# Patient Record
Sex: Male | Born: 1950
Health system: Southern US, Community
[De-identification: ages and names within clinical notes are randomized; demographics above are authoritative.]

## PROBLEM LIST (undated history)

## (undated) DIAGNOSIS — Z860100 Personal history of colon polyps, unspecified: Secondary | ICD-10-CM

## (undated) DIAGNOSIS — C801 Malignant (primary) neoplasm, unspecified: Secondary | ICD-10-CM

## (undated) DIAGNOSIS — E785 Hyperlipidemia, unspecified: Secondary | ICD-10-CM

## (undated) DIAGNOSIS — G4730 Sleep apnea, unspecified: Secondary | ICD-10-CM

## (undated) DIAGNOSIS — M1990 Unspecified osteoarthritis, unspecified site: Secondary | ICD-10-CM

## (undated) DIAGNOSIS — C439 Malignant melanoma of skin, unspecified: Secondary | ICD-10-CM

## (undated) HISTORY — PX: MEDIAL PARTIAL KNEE REPLACEMENT: SHX5965

## (undated) HISTORY — PX: COLONOSCOPY: SHX174

## (undated) HISTORY — DX: Sleep apnea, unspecified: G47.30

## (undated) HISTORY — DX: Personal history of colonic polyps: Z86.010

## (undated) HISTORY — DX: Personal history of colon polyps, unspecified: Z86.0100

## (undated) HISTORY — PX: KNEE ARTHROSCOPY: SHX127

## (undated) HISTORY — DX: Hyperlipidemia, unspecified: E78.5

## (undated) HISTORY — DX: Malignant melanoma of skin, unspecified: C43.9

---

## 2005-04-10 ENCOUNTER — Ambulatory Visit: Payer: Self-pay | Admitting: Family Medicine

## 2006-08-14 ENCOUNTER — Emergency Department: Payer: Self-pay | Admitting: Emergency Medicine

## 2007-04-21 ENCOUNTER — Ambulatory Visit: Payer: Self-pay | Admitting: Family Medicine

## 2007-04-21 DIAGNOSIS — IMO0002 Reserved for concepts with insufficient information to code with codable children: Secondary | ICD-10-CM | POA: Insufficient documentation

## 2007-10-28 ENCOUNTER — Telehealth: Payer: Self-pay | Admitting: Family Medicine

## 2009-10-10 ENCOUNTER — Ambulatory Visit: Payer: Self-pay | Admitting: Family Medicine

## 2009-10-10 DIAGNOSIS — J157 Pneumonia due to Mycoplasma pneumoniae: Secondary | ICD-10-CM | POA: Insufficient documentation

## 2010-08-23 ENCOUNTER — Ambulatory Visit: Admit: 2010-08-23 | Payer: Self-pay | Admitting: Family Medicine

## 2010-09-10 NOTE — Assessment & Plan Note (Signed)
Summary: cold like symptoms/alc   Vital Signs:  Patient profile:   60 year old male Height:      68 inches Weight:      202.0 pounds BMI:     30.83 Temp:     98.2 degrees F oral Pulse rate:   72 / minute Pulse rhythm:   regular BP sitting:   110 / 70  (left arm) Cuff size:   regular  Vitals Entered By: Benny Lennert CMA Duncan Dull) (October 10, 2009 12:03 PM) CC: cold symptoms for 1 weeks Is Patient Diabetic? No   History of Present Illness: 60 year old:    Acute Visit History:      The patient complains of cough, headache, nasal discharge, sinus problems, and sore throat.  He denies abdominal pain, chest pain, diarrhea, earache, fever, genitourinary symptoms, musculoskeletal symptoms, nausea, and rash.  Other comments include: 10 day history, + chills.        The character of the cough is described as productive.  He has no history of COPD.  There is no history of wheezing, sleep interference, shortness of breath, respiratory retractions, tachypnea, cyanosis, or interference with oral intake associated with his cough.        'Cold' or URI symptoms have been present with the sore throat.  There is no history of dysphagia, drooling, or recent exposure to strep.        He complains of sinus pressure and frontal headache.        Urine output has been normal.  He is tolerating clear liquids.        Allergies (verified): No Known Drug Allergies  Past History:  Past medical, surgical, family and social histories (including risk factors) reviewed for relevance to current acute and chronic problems.  Past Medical History: generally healthy, takes no meds  Family History: Reviewed history and no changes required. n/c  Social History: Reviewed history and no changes required. Married  Review of Systems       REVIEW OF SYSTEMS GEN: Acute illness details above. CV: No chest pain or SOB GI: No noted N or V Otherwise, pertinent positives and negatives are noted in the HPI.    Physical Exam  Additional Exam:  GEN: A and O x 3. WDWN. NAD.    ENT: Nose clear, ext NML.  No LAD.  No JVD.  TM's clear. Oropharynx clear.  PULM: Normal WOB, no distress. no focal crackles, but there are some diffuse rhonchorous sounds. No wheezing. CV: RRR, no M/G/R, No rubs, No JVD.   ABD: S, NT, ND, + BS. No rebound. No guarding. No HSM.   EXT: warm and well-perfused, No c/c/e. PSYCH: Pleasant and conversant.    Impression & Recommendations:  Problem # 1:  WALKING PNEUMONIA (ICD-483.0) Assessment New Discussed treatment based on lung exam - 10 day history would treat with antibiotics.  c/w otc sinus and cold medications.  Not acutely ill on exam, but not c/w pure uri   The following medications were removed from the medication list:    Keflex 500 Mg Caps (Cephalexin) .Marland Kitchen... 1 by mouth two times a day for 10 days His updated medication list for this problem includes:    Azithromycin 250 Mg Tabs (Azithromycin) .Marland Kitchen... 2 by  mouth today and then 1 daily for 4 days  Instructed patient to complete antibiotics, and call for worsened shortness of breath or new symptoms.   Complete Medication List: 1)  Azithromycin 250 Mg Tabs (Azithromycin) .... 2  by  mouth today and then 1 daily for 4 days Prescriptions: AZITHROMYCIN 250 MG  TABS (AZITHROMYCIN) 2 by  mouth today and then 1 daily for 4 days  #6 x 0   Entered and Authorized by:   Hannah Beat MD   Signed by:   Hannah Beat MD on 10/10/2009   Method used:   Electronically to        [facility]* (retail)       [address]       [place], [place]  [address]       Ph: [phone]       Fax: [phone]   RxID:   [id number]   Current Allergies (reviewed today): No known allergies

## 2011-07-14 ENCOUNTER — Ambulatory Visit (INDEPENDENT_AMBULATORY_CARE_PROVIDER_SITE_OTHER)
Admission: RE | Admit: 2011-07-14 | Discharge: 2011-07-14 | Disposition: A | Discharge status: Home / Self Care | Payer: PRIVATE HEALTH INSURANCE | Source: Ambulatory Visit | Attending: Family Medicine | Admitting: Family Medicine

## 2011-07-14 ENCOUNTER — Encounter: Payer: Self-pay | Admitting: Family Medicine

## 2011-07-14 ENCOUNTER — Ambulatory Visit (INDEPENDENT_AMBULATORY_CARE_PROVIDER_SITE_OTHER): Payer: PRIVATE HEALTH INSURANCE | Admitting: Family Medicine

## 2011-07-14 VITALS — BP 116/78 | HR 68 | Temp 97.9°F | Ht 69.0 in | Wt 207.2 lb

## 2011-07-14 DIAGNOSIS — M542 Cervicalgia: Secondary | ICD-10-CM

## 2011-07-14 MED ORDER — NAPROXEN 500 MG PO TABS: ORAL_TABLET | ORAL | Status: AC

## 2011-07-14 MED ORDER — IOHEXOL 300 MG/ML  SOLN
75.0000 mL | Freq: Once | INTRAMUSCULAR | Status: AC | PRN
  Administered 2011-07-14: 75 mL via INTRAVENOUS

## 2011-07-14 MED ORDER — CYCLOBENZAPRINE HCL 10 MG PO TABS: 10.0000 mg | ORAL_TABLET | Freq: Three times a day (TID) | ORAL | Status: AC | PRN

## 2011-07-14 NOTE — Assessment & Plan Note (Signed)
Tdap 12/2010. Story consistent with splenius strain, however trismus and fullness right lateral neck not consistent with this. Cervical spine xray - overall ok on my read, no gas in soft tissue, no vertebral fractures etc.  Await rad read. Will set up neck CT to eval for neck space infections as well as fullness right lateral inferior neck, ?muscle rupture vs cyst vs other. Treat as cervical strain with naprosyn and flexeril.

## 2011-07-14 NOTE — Progress Notes (Signed)
  Subjective:    Patient ID: Cody Pearson, male    DOB: 1950/12/15, 60 y.o.   MRN: [id number]  HPI CC: neck pain  Working yard this weekend.  Trouble turning on leaf blower.  Felt pop sensation, after that has had significant neck pain/stiffness in back of neck.  When leans forward, feels neck pulling.  Significant tenderness worse with movements, bending head.  Also with trismus and pain anterior neck.  No shooting pain down arms, paresthesias down arms, arm weakness.  No fevers/chills, recent cold sxs, AMS, confusion or HA.  No dysphagia.  Tried ibuprofen and tylenol yesterday and today, didn't really help.  Also tried bengay which didn't really help either and hot towel which did help some.  No prior problems with neck in past.  Did cut right forearm on hot tub last week.  Tdap UTD 12/2010.  Review of Systems Per HPI    Objective:   Physical Exam  Nursing note and vitals reviewed. Constitutional: He appears well-developed and well-nourished.       Stiff neck with movements  HENT:  Head: Normocephalic and atraumatic.  Right Ear: External ear normal.  Left Ear: External ear normal.  Mouth/Throat: Oropharynx is clear and moist. No oropharyngeal exudate.  Eyes: Conjunctivae and EOM are normal. Pupils are equal, round, and reactive to light. No scleral icterus.  Neck: Trachea normal. Muscular tenderness present. No spinous process tenderness present. Carotid bruit is not present. Decreased range of motion present. No edema and no erythema present.         Fullness right inferior SCM, no LAD  Cardiovascular: Normal rate, regular rhythm, normal heart sounds and intact distal pulses.   No murmur heard. Pulmonary/Chest: Effort normal and breath sounds normal. No respiratory distress. He has no wheezes. He has no rales.  Musculoskeletal:       Limited ROM neck 2/2 pain. No midline spine tenderness. Mild tenderness to palpation splenius mm bilaterally.   Lymphadenopathy:    He  has no cervical adenopathy.  Neurological: He has normal strength. No sensory deficit.       Neg spurling  Skin: Skin is warm and dry. No rash noted.  Psychiatric: He has a normal mood and affect.       Assessment & Plan:

## 2011-07-14 NOTE — Patient Instructions (Addendum)
We will treat this as muscle strain with course of flexeril 10mg  twice daily as needed, may make you sleepy.  Also take naprosyn 500mg  twice daily with food. If fever >101, or worsening stiffness instead of improving, please return here or go to ER for further evaluation. Pass by Marion's office to schedule neck ct for today. We will call you with results today at [phone] (H).  [place] [phone]

## 2013-06-10 ENCOUNTER — Ambulatory Visit: Payer: PRIVATE HEALTH INSURANCE | Admitting: Family Medicine

## 2013-06-17 ENCOUNTER — Encounter: Payer: Self-pay | Admitting: Internal Medicine

## 2013-06-17 ENCOUNTER — Ambulatory Visit (INDEPENDENT_AMBULATORY_CARE_PROVIDER_SITE_OTHER): Payer: Commercial Managed Care - PPO | Admitting: Family Medicine

## 2013-06-17 ENCOUNTER — Encounter: Payer: Self-pay | Admitting: Family Medicine

## 2013-06-17 VITALS — BP 102/66 | HR 89 | Temp 98.7°F | Ht 69.0 in | Wt 201.5 lb

## 2013-06-17 DIAGNOSIS — K5900 Constipation, unspecified: Secondary | ICD-10-CM | POA: Insufficient documentation

## 2013-06-17 DIAGNOSIS — K921 Melena: Secondary | ICD-10-CM | POA: Insufficient documentation

## 2013-06-17 MED ORDER — HYDROCORTISONE ACETATE 25 MG RE SUPP: 25.0000 mg | Freq: Every evening | RECTAL | Status: DC | PRN

## 2013-06-17 NOTE — Progress Notes (Signed)
Pre-visit discussion using our clinic review tool. No additional management support is needed unless otherwise documented below in the visit note.  

## 2013-06-17 NOTE — Progress Notes (Signed)
  Subjective:    Patient ID: Cody Pearson, male    DOB: 10-11-1950, 62 y.o.   MRN: [id number]  HPI Here for blood in stool  About a year ago her stools "changed on him" Harder to pass/ in little balls / has to strain  No change in diet Diet is fair - he did stop sodas  (did inc water and dec snacks and sweets) Not fruit or veg -not enough   Now he has some blood when he wipes - is bright red  Also some stinging with bm ? Hemorrhoid No itching   No abd pain or other symptoms   He uses vaseline to help    He has never had a screening colonoscopy  Ins will pay for that   Patient Active Problem List   Diagnosis Date Noted  . Neck pain, acute 07/14/2011  . WALKING PNEUMONIA 10/10/2009  . CELLULITIS/ABSCESS, ARM 04/21/2007   Past Medical History  Diagnosis Date  . No pertinent past medical history    No past surgical history on file. History  Substance Use Topics  . Smoking status: Never Smoker   . Smokeless tobacco: Not on file  . Alcohol Use: No   No family history on file. No Known Allergies No current outpatient prescriptions on file prior to visit.   No current facility-administered medications on file prior to visit.    Review of Systems Review of Systems  Constitutional: Negative for fever, appetite change, fatigue and unexpected weight change.  Eyes: Negative for pain and visual disturbance.  Respiratory: Negative for cough and shortness of breath.   Cardiovascular: Negative for cp or palpitations    Gastrointestinal: Negative for nausea, diarrhea/ abd pain/ dark stool .  Genitourinary: Negative for urgency and frequency.  Skin: Negative for pallor or rash   Neurological: Negative for weakness, light-headedness, numbness and headaches.  Hematological: Negative for adenopathy. Does not bruise/bleed easily.  Psychiatric/Behavioral: Negative for dysphoric mood. The patient is not nervous/anxious.         Objective:   Physical Exam  Constitutional:  He appears well-developed and well-nourished. No distress.  HENT:  Head: Normocephalic and atraumatic.  Mouth/Throat: Oropharynx is clear and moist.  Eyes: Conjunctivae and EOM are normal. Pupils are equal, round, and reactive to light. No scleral icterus.  Neck: Normal range of motion. Neck supple. No JVD present. No thyromegaly present.  Cardiovascular: Normal rate and regular rhythm.   Pulmonary/Chest: Effort normal and breath sounds normal.  Abdominal: Soft. Bowel sounds are normal. He exhibits no distension and no mass. There is no tenderness. There is no rebound and no guarding.  Genitourinary: Rectum normal and prostate normal. Rectal exam shows no external hemorrhoid, no fissure, no mass, no tenderness and anal tone normal. Guaiac negative stool.  Musculoskeletal: He exhibits no edema.  Lymphadenopathy:    He has no cervical adenopathy.  Neurological: He is alert.  Skin: Skin is warm and dry. No pallor.  Psychiatric: He has a normal mood and affect.          Assessment & Plan:

## 2013-06-17 NOTE — Patient Instructions (Signed)
For constipation- increase fruit and vegetables and water  Fiber supplement as needed Try to avoid straining  Use the anusol suppos - at bedtime for 7-10 days for hemorrhoids  We will refer you to GI at check out

## 2013-06-19 NOTE — Assessment & Plan Note (Signed)
Suspect hemorrhoidal due to new constipation  Nl exam  anusol hc suppositories-and update Ref to GI for further eval

## 2013-06-19 NOTE — Assessment & Plan Note (Signed)
Rev need for more fruit/veg and fiber in diet Will keep working on good water intake Disc use of miralax or stool softerners prn  Will ref to GI since this is a change from his prev habits-and he has never had a colonoscopy

## 2013-07-13 ENCOUNTER — Encounter: Payer: Self-pay | Admitting: Internal Medicine

## 2013-07-14 ENCOUNTER — Ambulatory Visit (INDEPENDENT_AMBULATORY_CARE_PROVIDER_SITE_OTHER): Payer: Commercial Managed Care - PPO | Admitting: Internal Medicine

## 2013-07-14 ENCOUNTER — Other Ambulatory Visit (INDEPENDENT_AMBULATORY_CARE_PROVIDER_SITE_OTHER): Payer: Commercial Managed Care - PPO

## 2013-07-14 ENCOUNTER — Encounter: Payer: Self-pay | Admitting: Internal Medicine

## 2013-07-14 VITALS — BP 110/64 | HR 68 | Ht 69.0 in | Wt 201.0 lb

## 2013-07-14 DIAGNOSIS — Z1211 Encounter for screening for malignant neoplasm of colon: Secondary | ICD-10-CM

## 2013-07-14 DIAGNOSIS — K625 Hemorrhage of anus and rectum: Secondary | ICD-10-CM

## 2013-07-14 LAB — COMPREHENSIVE METABOLIC PANEL
Albumin: 3.9 g/dL (ref 3.5–5.2)
CO2: 27 mEq/L (ref 19–32)
Chloride: 106 mEq/L (ref 96–112)
GFR: 75.5 mL/min (ref >60.00)
Glucose, Bld: 91 mg/dL (ref 70–99)
Potassium: 4.7 mEq/L (ref 3.5–5.1)
Sodium: 139 mEq/L (ref 135–145)
Total Protein: 6.5 g/dL (ref 6.0–8.3)

## 2013-07-14 LAB — CBC
RBC: 4.8 Mil/uL (ref 4.22–5.81)
WBC: 4.5 10*3/uL (ref 4.5–10.5)

## 2013-07-14 LAB — TSH: TSH: 2.12 u[IU]/mL (ref 0.35–5.50)

## 2013-07-14 MED ORDER — MOVIPREP 100 G PO SOLR: ORAL | Status: DC

## 2013-07-14 NOTE — Progress Notes (Signed)
Patient ID: Cody Pearson, male   DOB: 03/17/1951, 62 y.o.   MRN: [id number] HPI: Cody Pearson is a 62 year old male with little past medical history he was seen in consultation at request of Dr. Milinda Antis for intermittent rectal bleeding and consideration of screening colonoscopy. He is here alone today. He reports over the last 2 years he has developed "harder stools". He also has intermittent red blood with wiping and on the stool. This is painless in nature. He reports he is having approximately one bowel movement a day but can skip days. He denies abdominal pain. No diarrhea. No melena. Good appetite. No nausea or vomiting. He does report his father has a history of colon polyps though late in life. No family history of colon cancer or IBD. He has not had recent labs to his knowledge. He has never had a colonoscopy.  Past Medical History  Diagnosis Date  . No pertinent past medical history     History reviewed. No pertinent past surgical history.  Meds - none  No Known Allergies  History reviewed. No pertinent family history.  History  Substance Use Topics  . Smoking status: Never Smoker   . Smokeless tobacco: Not on file  . Alcohol Use: No    ROS: As per history of present illness, otherwise negative  BP 110/64  Pulse 68  Ht 5\' 9"  (1.753 m)  Wt 201 lb (91.173 kg)  BMI 29.67 kg/m2 Constitutional: Well-developed and well-nourished. No distress. HEENT: Normocephalic and atraumatic. Oropharynx is clear and moist. No oropharyngeal exudate. Conjunctivae are normal.  No scleral icterus. Neck: Neck supple. Trachea midline. Cardiovascular: Normal rate, regular rhythm and intact distal pulses. No M/R/G Pulmonary/chest: Effort normal and breath sounds normal. No wheezing, rales or rhonchi. Abdominal: Soft, nontender, nondistended. Bowel sounds active throughout. There are no masses palpable. No hepatosplenomegaly. Extremities: no clubbing, cyanosis, or edema Lymphadenopathy: No  cervical adenopathy noted. Neurological: Alert and oriented to person place and time. Skin: Skin is warm and dry. No rashes noted. Psychiatric: Normal mood and affect. Behavior is normal.  RELEVANT LABS AND IMAGING: CBC    Component Value Date/Time   WBC 4.5 07/14/2013 1025   RBC 4.80 07/14/2013 1025   HGB 14.7 07/14/2013 1025   HCT 43.0 07/14/2013 1025   PLT 188.0 07/14/2013 1025   MCV 89.5 07/14/2013 1025   MCHC 34.1 07/14/2013 1025   RDW 13.3 07/14/2013 1025    CMP     Component Value Date/Time   NA 139 07/14/2013 1025   K 4.7 07/14/2013 1025   CL 106 07/14/2013 1025   CO2 27 07/14/2013 1025   GLUCOSE 91 07/14/2013 1025   BUN 16 07/14/2013 1025   CREATININE 1.1 07/14/2013 1025   CALCIUM 9.1 07/14/2013 1025   PROT 6.5 07/14/2013 1025   ALBUMIN 3.9 07/14/2013 1025   AST 20 07/14/2013 1025   ALT 20 07/14/2013 1025   ALKPHOS 52 07/14/2013 1025   BILITOT 0.6 07/14/2013 1025   TSH normal  ASSESSMENT/PLAN: 62 year old male with little past medical history he was seen in consultation at request of Dr. Milinda Antis for intermittent rectal bleeding and consideration of screening colonoscopy.  1. CRC screening/intermittent painless rectal bleeding -- I recommended a screening colonoscopy which will also suffice to evaluate his rectal bleeding. We discussed the test today including risks and benefits and he is agreeable to proceed. I did check labs today to include CBC and CMP, both of which were normal. It is reassuring that he has not developed  an anemia.  TSH is also normal. His rectal bleeding could be secondary to internal hemorrhoids and if so we can recommend treatment after the colonoscopy.

## 2013-07-14 NOTE — Patient Instructions (Signed)
You have been scheduled for a colonoscopy with propofol. Please follow written instructions given to you at your visit today.  Please pick up your prep kit at the pharmacy within the next 1-3 days. If you use inhalers (even only as needed), please bring them with you on the day of your procedure. Your physician has requested that you go to www.startemmi.com and enter the access code given to you at your visit today. This web site gives a general overview about your procedure. However, you should still follow specific instructions given to you by our office regarding your preparation for the procedure.   Your physician has requested that you go to the basement for the following lab work before leaving today: CBC, CMP, TSH  We have sent the following medications to your pharmacy for you to pick up at your convenience: moviprep; you were given instructions today at your office visit                                                We are excited to introduce MyChart, a new best-in-class service that provides you online access to important information in your electronic medical record. We want to make it easier for you to view your health information - all in one secure location - when and where you need it. We expect MyChart will enhance the quality of care and service we provide.  When you register for MyChart, you can:    View your test results.    Request appointments and receive appointment reminders via email.    Request medication renewals.    View your medical history, allergies, medications and immunizations.    Communicate with your physician's office through a password-protected site.    Conveniently print information such as your medication lists.  To find out if MyChart is right for you, please talk to a member of our clinical staff today. We will gladly answer your questions about this free health and wellness tool.  If you are age 52 or older and want a member of your family  to have access to your record, you must provide written consent by completing a proxy form available at our office. Please speak to our clinical staff about guidelines regarding accounts for patients younger than age 48.  As you activate your MyChart account and need any technical assistance, please call the MyChart technical support line at [phone]) or email your question to [email or web address]. If you email your question(s), please include your name, a return phone number and the best time to reach you.  If you have non-urgent health-related questions, you can send a message to our office through MyChart at [email or web address]. If you have a medical emergency, call 911.  Thank you for using MyChart as your new health and wellness resource!   MyChart licensed from [employer],  4540-9811. Patents Pending.

## 2013-07-20 ENCOUNTER — Encounter: Payer: Self-pay | Admitting: Internal Medicine

## 2013-08-15 ENCOUNTER — Encounter: Payer: Self-pay | Admitting: Internal Medicine

## 2013-08-15 ENCOUNTER — Ambulatory Visit (AMBULATORY_SURGERY_CENTER): Payer: Commercial Managed Care - PPO | Admitting: Internal Medicine

## 2013-08-15 VITALS — BP 109/73 | HR 64 | Temp 97.0°F | Resp 14 | Ht 69.0 in | Wt 201.0 lb

## 2013-08-15 DIAGNOSIS — D126 Benign neoplasm of colon, unspecified: Secondary | ICD-10-CM

## 2013-08-15 DIAGNOSIS — Z1211 Encounter for screening for malignant neoplasm of colon: Secondary | ICD-10-CM

## 2013-08-15 DIAGNOSIS — K625 Hemorrhage of anus and rectum: Secondary | ICD-10-CM

## 2013-08-15 MED ORDER — SODIUM CHLORIDE 0.9 % IV SOLN: 500.0000 mL | INTRAVENOUS | Status: DC

## 2013-08-15 NOTE — Progress Notes (Signed)
Called to room to assist during endoscopic procedure.  Patient ID and intended procedure confirmed with present staff. Received instructions for my participation in the procedure from the performing physician.  

## 2013-08-15 NOTE — Patient Instructions (Addendum)
YOU HAD AN ENDOSCOPIC PROCEDURE TODAY AT THE [facility]: Refer to the procedure report that was given to you for any specific questions about what was found during the examination.  If the procedure report does not answer your questions, please call your gastroenterologist to clarify.  If you requested that your care partner not be given the details of your procedure findings, then the procedure report has been included in a sealed envelope for you to review at your convenience later.  YOU SHOULD EXPECT: Some feelings of bloating in the abdomen. Passage of more gas than usual.  Walking can help get rid of the air that was put into your GI tract during the procedure and reduce the bloating. If you had a lower endoscopy (such as a colonoscopy or flexible sigmoidoscopy) you may notice spotting of blood in your stool or on the toilet paper. If you underwent a bowel prep for your procedure, then you may not have a normal bowel movement for a few days.  DIET: Your first meal following the procedure should be a light meal and then it is ok to progress to your normal diet.  A half-sandwich or bowl of soup is an example of a good first meal.  Heavy or fried foods are harder to digest and may make you feel nauseous or bloated.  Likewise meals heavy in dairy and vegetables can cause extra gas to form and this can also increase the bloating.  Drink plenty of fluids but you should avoid alcoholic beverages for 24 hours.  ACTIVITY: Your care partner should take you home directly after the procedure.  You should plan to take it easy, moving slowly for the rest of the day.  You can resume normal activity the day after the procedure however you should NOT DRIVE or use heavy machinery for 24 hours (because of the sedation medicines used during the test).    SYMPTOMS TO REPORT IMMEDIATELY: A gastroenterologist can be reached at any hour.  During normal business hours, 8:30 AM to 5:00 PM Monday through Friday,  call [phone].  After hours and on weekends, please call the GI answering service at [phone] who will take a message and have the physician on call contact you.   Following lower endoscopy (colonoscopy or flexible sigmoidoscopy):  Excessive amounts of blood in the stool  Significant tenderness or worsening of abdominal pains  Swelling of the abdomen that is new, acute  Fever of 100F or higher    FOLLOW UP: If any biopsies were taken you will be contacted by phone or by letter within the next 1-3 weeks.  Call your gastroenterologist if you have not heard about the biopsies in 3 weeks.  Our staff will call the home number listed on your records the next business day following your procedure to check on you and address any questions or concerns that you may have at that time regarding the information given to you following your procedure. This is a courtesy call and so if there is no answer at the home number and we have not heard from you through the emergency physician on call, we will assume that you have returned to your regular daily activities without incident.  SIGNATURES/CONFIDENTIALITY: You and/or your care partner have signed paperwork which will be entered into your electronic medical record.  These signatures attest to the fact that that the information above on your After Visit Summary has been reviewed and is understood.  Full responsibility of the confidentiality   of this discharge information lies with you and/or your care-partner.  Polyp information given.  Dr. Hilarie Fredrickson will advise you of need for next colonoscopy after pathology results are reviewed.   Hydrocortisone supp. 25 mg. Twice daily if needed for 5 days.

## 2013-08-15 NOTE — Progress Notes (Signed)
Procedure ends, to recovery, report given and VSS. 

## 2013-08-15 NOTE — Op Note (Signed)
[place]  [address] [place], [id number]   COLONOSCOPY PROCEDURE REPORT  PATIENT: Cody Pearson, Cody Pearson  MR#: [id number] BIRTHDATE: [birth year] , 62  yrs. old GENDER: Male ENDOSCOPIST: Jerene Bears, MD REFERRED IO:[id number], [place] A. PROCEDURE DATE:  08/15/2013 PROCEDURE:   Colonoscopy with snare polypectomy and Colonoscopy with cold biopsy polypectomy First Screening Colonoscopy - Avg.  risk and is 50 yrs.  old or older Yes.  Prior Negative Screening - Now for repeat screening. N/A  History of Adenoma - Now for follow-up colonoscopy & has been > or = to 3 yrs.  N/A  Polyps Removed Today? Yes. ASA CLASS:   Class II INDICATIONS:average risk screening and Rectal Bleeding. MEDICATIONS: MAC sedation, administered by CRNA and Propofol (Diprivan) 230 mg IV  DESCRIPTION OF PROCEDURE:   After the risks benefits and alternatives of the procedure were thoroughly explained, informed consent was obtained.  A digital rectal exam revealed no rectal mass.   The LB [id number]  endoscope was introduced through the anus and advanced to the cecum, which was identified by both the appendix and ileocecal valve. No adverse events experienced. The quality of the prep was Moviprep fair  The instrument was then slowly withdrawn as the colon was fully examined.  COLON FINDINGS: Two sessile polyps measuring 3-4 mm in size were found at the appendiceal orifice and in the ascending colon. Polypectomy was performed with cold forceps.  All resections were complete and all polyp tissue was completely retrieved.   A sessile polyp measuring 7-8 mm in size with a mucous cap was found in the ascending colon.  A polypectomy was performed with a cold snare. The resection was complete and the polyp tissue was completely retrieved.   Two sessile polyps ranging between 3-87mm in size were found in the sigmoid colon.  Polypectomy was performed using cold snare.  All resections were complete and all  polyp tissue was completely retrieved.  Retroflexed views revealed internal hemorrhoids. The time to cecum=3 minutes 55 seconds.  Withdrawal time=14 minutes 55 seconds.  The scope was withdrawn and the procedure completed. COMPLICATIONS: There were no complications.  ENDOSCOPIC IMPRESSION: 1.   Two sessile polyps measuring 3-4 mm in size were found at the appendiceal orifice and in the ascending colon; Polypectomy was performed with cold forceps 2.   Sessile polyp measuring 7-8 mm in size was found in the ascending colon; polypectomy was performed with a cold snare 3.   Two sessile polyps ranging between 3-19mm in size were found in the sigmoid colon; Polypectomy was performed using cold snare  RECOMMENDATIONS: 1.  Await pathology results 2.  Timing of repeat colonoscopy will be determined by pathology findings. 3.  You will receive a letter within 1-2 weeks with the results of your biopsy as well as final recommendations.  Please call my office if you have not received a letter after 3 weeks. 4.  Hydrocortisone suppository 25 mg twice daily x 5 days can be used for internal hemorrhoids, if this fails to improve bleeding/symptoms, then hemorrhoids banding can be performed in the office   eSigned:  Jerene Bears, MD 08/15/2013 4:37 PM   cc: The Patient and Abner Greenspan, MD   PATIENT NAME:  Cody Pearson, Cody Pearson MR#: [id number]

## 2013-08-16 ENCOUNTER — Telehealth: Payer: Self-pay | Admitting: *Deleted

## 2013-08-16 NOTE — Telephone Encounter (Signed)
  Follow up Call-  Call back number 08/15/2013  Post procedure Call Back phone  # [phone]  Permission to leave phone message Yes     Patient questions:  Do you have a fever, pain , or abdominal swelling? no Pain Score  0 *  Have you tolerated food without any problems? yes  Have you been able to return to your normal activities? yes  Do you have any questions about your discharge instructions: Diet   no Medications  no Follow up visit  no  Do you have questions or concerns about your Care? no  Actions: * If pain score is 4 or above: No action needed, pain <4.

## 2013-08-22 ENCOUNTER — Encounter: Payer: Self-pay | Admitting: Internal Medicine

## 2014-12-20 ENCOUNTER — Encounter: Payer: Self-pay | Admitting: Primary Care

## 2014-12-20 ENCOUNTER — Ambulatory Visit (INDEPENDENT_AMBULATORY_CARE_PROVIDER_SITE_OTHER): Payer: Commercial Managed Care - PPO | Admitting: Primary Care

## 2014-12-20 VITALS — BP 112/72 | HR 82 | Temp 98.1°F | Ht 69.0 in | Wt 200.8 lb

## 2014-12-20 DIAGNOSIS — Z0000 Encounter for general adult medical examination without abnormal findings: Secondary | ICD-10-CM | POA: Diagnosis not present

## 2014-12-20 DIAGNOSIS — K921 Melena: Secondary | ICD-10-CM | POA: Diagnosis not present

## 2014-12-20 DIAGNOSIS — Z125 Encounter for screening for malignant neoplasm of prostate: Secondary | ICD-10-CM | POA: Diagnosis not present

## 2014-12-20 NOTE — Progress Notes (Signed)
Pre visit review using our clinic review tool, if applicable. No additional management support is needed unless otherwise documented below in the visit note. 

## 2014-12-20 NOTE — Progress Notes (Signed)
Subjective:    Patient ID: Cody Pearson, male    DOB: 1950-12-18, 64 y.o.   MRN: [id number]  HPI  Cody Pearson is a 64 year old male who presents today to re-establish care and discuss the problems mentioned below. Will review old records. He is requesting an annual physical that is required by his employer.   1) Blood in stool: Present in 2014, had evaluation at [facility] and was referred for colonoscopy. Colonoscopy found 5 polyps that were benign. Denies blood in stool since.  Immunizations: -Tetanus: Last Td was 2012. -Influenza: Received last season -Pneumonia: Has never received. Not high risk. -Shingles: He declines today  Diet: Endorses healthy diet. Consists of lasagna, casseroles, vegetables, pizzas, salads, sandwiches.  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 Eye exam: Last exam was one year ago. Due this November 2016, but does not plan on going until 2017. Dental exam: Last month at [facility]. Colonoscopy: Colonoscopy in 2014, found benign polyps which were removed. Repeat in 2024 per patient     Review of Systems  Constitutional: Negative for fatigue and unexpected weight change.  HENT: Negative for rhinorrhea.   Respiratory: Negative for cough and shortness of breath.   Cardiovascular: Negative for chest pain.  Gastrointestinal: Negative for diarrhea and blood in stool.       Occasional constipation.  Genitourinary: Negative for dysuria, frequency and difficulty urinating.  Musculoskeletal: Negative for myalgias and arthralgias.  Skin: Negative for rash.  Allergic/Immunologic: Negative for environmental allergies.  Neurological: Negative for dizziness, numbness and headaches.  Hematological: Negative for adenopathy.  Psychiatric/Behavioral:       Denies concerns for anxiety or depression.       Past Medical History  Diagnosis Date  . History of colonic polyps     History   Social History  . Marital Status:  Married    Spouse Name: N/A  . Number of Children: 3  . Years of Education: N/A   Occupational History  . Maintenance    Social History Main Topics  . Smoking status: Never Smoker   . Smokeless tobacco: Not on file  . Alcohol Use: No  . Drug Use: No  . Sexual Activity: Not on file   Other Topics Concern  . 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       History reviewed. No pertinent past surgical history.  Family History  Problem Relation Age of Onset  . Aneurysm Paternal Grandmother     No Known Allergies  No current outpatient prescriptions on file prior to visit.   No current facility-administered medications on file prior to visit.    BP 112/72 mmHg  Pulse 82  Temp(Src) 98.1 F (36.7 C) (Oral)  Ht 5\' 9"  (1.753 m)  Wt 200 lb 12.8 oz (91.082 kg)  BMI 29.64 kg/m2  SpO2 95%    Objective:   Physical Exam  Constitutional: He is oriented to person, place, and time. He appears well-developed.  HENT:  Right Ear: Tympanic membrane and ear canal normal.  Left Ear: Tympanic membrane and ear canal normal.  Nose: Nose normal.  Mouth/Throat: Oropharynx is clear and moist.  Eyes: Conjunctivae and EOM are normal. Pupils are equal, round, and reactive to light.  Neck: Neck supple. No thyromegaly present.  Cardiovascular: Normal rate and regular rhythm.   Pulmonary/Chest: Effort normal and breath sounds normal.  Abdominal: Soft. Bowel  sounds are normal. He exhibits no mass. There is no tenderness.  Musculoskeletal: Normal range of motion.  Lymphadenopathy:    He has no cervical adenopathy.  Neurological: He is alert and oriented to person, place, and time. He has normal reflexes. No cranial nerve deficit. Coordination normal.  Skin: Skin is warm and dry.  Psychiatric: He has a normal mood and affect.          Assessment & Plan:

## 2014-12-20 NOTE — Assessment & Plan Note (Signed)
Tetanus up to date. Declines Shingles vaccine. Discussed the importance of sun screen and staying hydrated this summer, especially since he works outside.  Discussed healthy diet and exercise; wears seatbelts. Labs to be completed today. Exam unremarkable. Follow up in one year.

## 2014-12-20 NOTE — Patient Instructions (Signed)
Complete lab work prior to leaving today. I will notify you of your results. It was a pleasure to meet you today! Please don't hesitate to call me with any questions. Welcome back to [facility]! Follow up as needed.

## 2014-12-20 NOTE — Assessment & Plan Note (Signed)
None present since polyps removed in early 2015.

## 2014-12-21 ENCOUNTER — Encounter: Payer: Self-pay | Admitting: Primary Care

## 2014-12-21 LAB — COMPREHENSIVE METABOLIC PANEL
ALBUMIN: 4.2 g/dL (ref 3.5–5.2)
ALT: 17 U/L (ref 0–53)
AST: 18 U/L (ref 0–37)
Alkaline Phosphatase: 62 U/L (ref 39–117)
BUN: 18 mg/dL (ref 6–23)
CALCIUM: 9.4 mg/dL (ref 8.4–10.5)
CHLORIDE: 106 meq/L (ref 96–112)
CO2: 32 mEq/L (ref 19–32)
Creatinine, Ser: 1.16 mg/dL (ref 0.40–1.50)
GFR: 67.72 mL/min (ref >60.00)
Glucose, Bld: 91 mg/dL (ref 70–99)
POTASSIUM: 4.6 meq/L (ref 3.5–5.1)
Sodium: 141 mEq/L (ref 135–145)
TOTAL PROTEIN: 6.6 g/dL (ref 6.0–8.3)
Total Bilirubin: 0.4 mg/dL (ref 0.2–1.2)

## 2014-12-21 LAB — LIPID PANEL
CHOL/HDL RATIO: 4
CHOLESTEROL: 173 mg/dL (ref 0–200)
HDL: 39 mg/dL — AB (ref >39.00)
LDL Cholesterol: 99 mg/dL (ref 0–99)
NonHDL: 134
TRIGLYCERIDES: 173 mg/dL — AB (ref 0.0–149.0)
VLDL: 34.6 mg/dL (ref 0.0–40.0)

## 2014-12-21 LAB — HEMOGLOBIN A1C: HEMOGLOBIN A1C: 5.9 % (ref 4.6–6.5)

## 2014-12-21 LAB — CBC WITH DIFFERENTIAL/PLATELET
BASOS ABS: 0 10*3/uL (ref 0.0–0.1)
Basophils Relative: 0.6 % (ref 0.0–3.0)
Eosinophils Absolute: 0.3 10*3/uL (ref 0.0–0.7)
Eosinophils Relative: 5.7 % — ABNORMAL HIGH (ref 0.0–5.0)
HEMATOCRIT: 44 % (ref 39.0–52.0)
HEMOGLOBIN: 15.1 g/dL (ref 13.0–17.0)
LYMPHS ABS: 1.4 10*3/uL (ref 0.7–4.0)
Lymphocytes Relative: 25.5 % (ref 12.0–46.0)
MCHC: 34.2 g/dL (ref 30.0–36.0)
MCV: 87.7 fl (ref 78.0–100.0)
MONO ABS: 0.6 10*3/uL (ref 0.1–1.0)
Monocytes Relative: 11 % (ref 3.0–12.0)
NEUTROS ABS: 3.1 10*3/uL (ref 1.4–7.7)
Neutrophils Relative %: 57.2 % (ref 43.0–77.0)
PLATELETS: 207 10*3/uL (ref 150.0–400.0)
RBC: 5.01 Mil/uL (ref 4.22–5.81)
RDW: 13.6 % (ref 11.5–15.5)
WBC: 5.4 10*3/uL (ref 4.0–10.5)

## 2014-12-21 LAB — TSH: TSH: 1.9 u[IU]/mL (ref 0.35–4.50)

## 2014-12-21 LAB — PSA: PSA: 0.57 ng/mL (ref 0.10–4.00)

## 2014-12-22 ENCOUNTER — Encounter: Payer: Self-pay | Admitting: *Deleted

## 2015-10-07 ENCOUNTER — Other Ambulatory Visit: Payer: Self-pay | Admitting: Primary Care

## 2015-10-07 DIAGNOSIS — R7303 Prediabetes: Secondary | ICD-10-CM

## 2015-10-07 DIAGNOSIS — E781 Pure hyperglyceridemia: Secondary | ICD-10-CM

## 2015-10-12 ENCOUNTER — Other Ambulatory Visit (INDEPENDENT_AMBULATORY_CARE_PROVIDER_SITE_OTHER): Payer: Commercial Managed Care - PPO

## 2015-10-12 DIAGNOSIS — E781 Pure hyperglyceridemia: Secondary | ICD-10-CM

## 2015-10-12 DIAGNOSIS — R7303 Prediabetes: Secondary | ICD-10-CM

## 2015-10-12 LAB — COMPREHENSIVE METABOLIC PANEL
ALBUMIN: 4.2 g/dL (ref 3.5–5.2)
ALT: 15 U/L (ref 0–53)
AST: 17 U/L (ref 0–37)
Alkaline Phosphatase: 55 U/L (ref 39–117)
BUN: 16 mg/dL (ref 6–23)
CHLORIDE: 107 meq/L (ref 96–112)
CO2: 31 mEq/L (ref 19–32)
Calcium: 9.3 mg/dL (ref 8.4–10.5)
Creatinine, Ser: 1.18 mg/dL (ref 0.40–1.50)
GFR: 66.22 mL/min (ref >60.00)
Glucose, Bld: 104 mg/dL — ABNORMAL HIGH (ref 70–99)
POTASSIUM: 4.8 meq/L (ref 3.5–5.1)
SODIUM: 143 meq/L (ref 135–145)
Total Bilirubin: 0.5 mg/dL (ref 0.2–1.2)
Total Protein: 6.5 g/dL (ref 6.0–8.3)

## 2015-10-12 LAB — LIPID PANEL
CHOLESTEROL: 168 mg/dL (ref 0–200)
HDL: 43.3 mg/dL (ref >39.00)
LDL CALC: 114 mg/dL — AB (ref 0–99)
NonHDL: 125.07
TRIGLYCERIDES: 57 mg/dL (ref 0.0–149.0)
Total CHOL/HDL Ratio: 4
VLDL: 11.4 mg/dL (ref 0.0–40.0)

## 2015-10-12 LAB — HEMOGLOBIN A1C: HEMOGLOBIN A1C: 5.9 % (ref 4.6–6.5)

## 2015-10-15 ENCOUNTER — Ambulatory Visit (INDEPENDENT_AMBULATORY_CARE_PROVIDER_SITE_OTHER): Payer: Commercial Managed Care - PPO | Admitting: Primary Care

## 2015-10-15 ENCOUNTER — Encounter: Payer: Self-pay | Admitting: Primary Care

## 2015-10-15 VITALS — BP 124/76 | HR 79 | Temp 97.9°F | Ht 69.0 in | Wt 201.0 lb

## 2015-10-15 DIAGNOSIS — R7303 Prediabetes: Secondary | ICD-10-CM

## 2015-10-15 DIAGNOSIS — Z0000 Encounter for general adult medical examination without abnormal findings: Secondary | ICD-10-CM | POA: Diagnosis not present

## 2015-10-15 NOTE — Assessment & Plan Note (Signed)
A1C of 5.9 today. Poor diet loaded with sweets and sodas. Discussed reduction of these things. Recheck A1C in 3 months.

## 2015-10-15 NOTE — Progress Notes (Signed)
Pre visit review using our clinic review tool, if applicable. No additional management support is needed unless otherwise documented below in the visit note. 

## 2015-10-15 NOTE — Assessment & Plan Note (Signed)
Tdap, flu UTD. Declines Zostavax vaccination. Discussed the importance of a healthy diet and regular exercise in order for weight loss and to reduce risk of other medical diseases. Labs with borderline diabetes. Will recheck in 3 months. Exam unremarkable.  Follow up in 1 year for repeat physical.

## 2015-10-15 NOTE — Patient Instructions (Addendum)
You are borderline diabetic. Work to reduce consumption of sweets, sodas, fried foods, fatty foods, fast foods.  Start exercising. You should be getting 1 hour of moderate intensity exercise 5 days weekly.  Ensure you are drinking enough water. You should be consuming 64 ounces of water daily.  Schedule a lab only appointment in 3 months for repeat testing for borderline diabetes.  Follow up in 1 year for repeat physical or sooner if needed.  It was a pleasure to see you today!

## 2015-10-15 NOTE — Progress Notes (Signed)
Subjective:    Patient ID: Cody Pearson, male    DOB: May 18, 1951, 65 y.o.   MRN: [id number]  HPI  Cody Pearson is a 65 year old male who presents today for complete physical.  Immunizations: -Tetanus: Completed in 2012 -Influenza: Completed in October 2016 -Shingles: Has not completed, declines.   Diet: He endorses a healthy diet. Breakfast: Carnation instant breakfast, bacon and eggs on weekends Lunch: Sandwich, eats out once weekly. Dinner: Eats out, left over meals, breakfast food Snacks: Candy bars Desserts: Every night. Ice cream. 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 Eye exam: Completed 2 years ago, plans to schedule next year. Dental exam: Completes semi-annually. Colonoscopy: Completed in 2015    Review of Systems  Constitutional: Negative for unexpected weight change.  HENT: Negative for rhinorrhea.   Respiratory: Negative for cough and shortness of breath.   Cardiovascular: Negative for chest pain.  Gastrointestinal: Negative for diarrhea and constipation.  Genitourinary: Negative for difficulty urinating.  Musculoskeletal: Negative for myalgias and arthralgias.  Skin: Negative for rash.  Allergic/Immunologic: Negative for environmental allergies.  Neurological: Negative for dizziness, numbness and headaches.  Psychiatric/Behavioral:       Denies concerns for anxiety or depression       Past Medical History  Diagnosis Date  . History of colonic polyps     Social History   Social History  . Marital Status: Married    Spouse Name: N/A  . Number of Children: 3  . Years of Education: N/A   Occupational History  . Maintenance    Social History Main Topics  . Smoking status: Never Smoker   . Smokeless tobacco: Not on file  . Alcohol Use: No  . Drug Use: No  . Sexual Activity: Not on file   Other Topics Concern  . 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   Highest level of education 12th grade.   Married.   Lives locally.       No past surgical history on file.  Family History  Problem Relation Age of Onset  . Aneurysm Paternal Grandmother     No Known Allergies  No current outpatient prescriptions on file prior to visit.   No current facility-administered medications on file prior to visit.    BP 124/76 mmHg  Pulse 79  Temp(Src) 97.9 F (36.6 C) (Oral)  Ht 5\' 9"  (1.753 m)  Wt 201 lb (91.173 kg)  BMI 29.67 kg/m2  SpO2 97%    Objective:   Physical Exam  Constitutional: He is oriented to person, place, and time. He appears well-nourished.  HENT:  Right Ear: Tympanic membrane and ear canal normal.  Left Ear: Tympanic membrane and ear canal normal.  Nose: Nose normal. Right sinus exhibits no maxillary sinus tenderness and no frontal sinus tenderness. Left sinus exhibits no maxillary sinus tenderness and no frontal sinus tenderness.  Mouth/Throat: Oropharynx is clear and moist.  Eyes: Conjunctivae and EOM are normal. Pupils are equal, round, and reactive to light.  Neck: Neck supple. No thyromegaly present.  Cardiovascular: Normal rate, regular rhythm and normal heart sounds.   Pulmonary/Chest: Effort normal and breath sounds normal. He has no wheezes. He has no rales.  Abdominal: Soft. Bowel sounds are normal. There is no tenderness.  Musculoskeletal: Normal range of motion.  Neurological: He is alert and oriented to person, place,  and time. He has normal reflexes. No cranial nerve deficit.  Skin: Skin is warm and dry.  Psychiatric: He has a normal mood and affect.          Assessment & Plan:

## 2016-01-15 ENCOUNTER — Encounter: Payer: Self-pay | Admitting: *Deleted

## 2016-01-15 ENCOUNTER — Other Ambulatory Visit (INDEPENDENT_AMBULATORY_CARE_PROVIDER_SITE_OTHER): Payer: Commercial Managed Care - PPO

## 2016-01-15 DIAGNOSIS — R7303 Prediabetes: Secondary | ICD-10-CM

## 2016-01-15 LAB — HEMOGLOBIN A1C: HEMOGLOBIN A1C: 5.7 % (ref 4.6–6.5)

## 2016-01-21 ENCOUNTER — Encounter: Payer: Self-pay | Admitting: Primary Care

## 2016-01-21 ENCOUNTER — Ambulatory Visit (INDEPENDENT_AMBULATORY_CARE_PROVIDER_SITE_OTHER): Payer: Commercial Managed Care - PPO | Admitting: Primary Care

## 2016-01-21 VITALS — BP 112/72 | HR 70 | Temp 98.0°F | Ht 69.0 in | Wt 192.0 lb

## 2016-01-21 DIAGNOSIS — L237 Allergic contact dermatitis due to plants, except food: Secondary | ICD-10-CM | POA: Diagnosis not present

## 2016-01-21 MED ORDER — METHYLPREDNISOLONE ACETATE 80 MG/ML IJ SUSP
80.0000 mg | Freq: Once | INTRAMUSCULAR | Status: AC
  Administered 2016-01-21: 80 mg via INTRAMUSCULAR

## 2016-01-21 MED ORDER — TRIAMCINOLONE ACETONIDE 0.1 % EX CREA: 1.0000 "application " | TOPICAL_CREAM | Freq: Two times a day (BID) | CUTANEOUS | Status: DC

## 2016-01-21 NOTE — Progress Notes (Signed)
   Subjective:    Patient ID: Cody Pearson, male    DOB: 1950-10-16, 65 y.o.   MRN: [id number]  HPI  [name] is a 65 year old male who presents today with a chief complaint of rash. He was working out in the flower beds on Thursday and Friday last week, came into contact with poison ivy and oak. His rash located to the right upper extremity, anterior abdomen, and right knee. He's tried applying OTC cortisone cream without improvement. Denies shortness of breath, tightness to his throat. His rash is itchy in nature.    Review of Systems  HENT: Negative for trouble swallowing.   Respiratory: Negative for shortness of breath.   Cardiovascular: Negative for chest pain.  Skin: Positive for rash.       Past Medical History  Diagnosis Date  . History of colonic polyps      Social History   Social History  . Marital Status: Married    Spouse Name: N/A  . Number of Children: 3  . Years of Education: N/A   Occupational History  . Maintenance    Social History Main Topics  . Smoking status: Never Smoker   . Smokeless tobacco: Not on file  . Alcohol Use: No  . Drug Use: No  . Sexual Activity: Not on file   Other Topics Concern  . 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   Highest level of education 12th grade.   Married.   Lives locally.       No past surgical history on file.  Family History  Problem Relation Age of Onset  . Aneurysm Paternal Grandmother     No Known Allergies  No current outpatient prescriptions on file prior to visit.   No current facility-administered medications on file prior to visit.    BP 112/72 mmHg  Pulse 70  Temp(Src) 98 F (36.7 C) (Oral)  Ht 5\' 9"  (1.753 m)  Wt 192 lb (87.091 kg)  BMI 28.34 kg/m2  SpO2 96%    Objective:   Physical Exam  Constitutional: He appears well-nourished.  Neck: Neck supple.  Cardiovascular: Normal rate and regular rhythm.     Pulmonary/Chest: Effort normal and breath sounds normal. He has no wheezes.  Skin: Skin is warm and dry.  Moderate contact dermatitis to right upper extremity. Mild contact dermatitis to anterior abdomen and right patella. All representative of poison ivy dermatitis. No open lesions.          Assessment & Plan:  Poison Ivy/Oak Dermatitis:  Working out in flower beds last week, rash present since. No improvement with OTC treatment. Exam today with rash that is consistent with diagnosis of contact dermatitis secondary to poison ivy/oak. IM depo medrol provided today. Rx for Triamcinolone cream to apply PRN itching. He is to notify me if no improvement by Wednesday this week.

## 2016-01-21 NOTE — Patient Instructions (Signed)
Apply the Triamcinolone cream twice daily to your rash for the next 5-7 days. Do not apply this to your face.  Please call me Wednesday this week if no improvement in rash.  Wash all clothing, towels, bed sheets to prevent spread of plant oils.  It was a pleasure to see you today!  Poison [employer] ivy is a inflammation of the skin (contact dermatitis) caused by touching the allergens on the leaves of the ivy plant following previous exposure to the plant. The rash usually appears 48 hours after exposure. The rash is usually bumps (papules) or blisters (vesicles) in a linear pattern. Depending on your own sensitivity, the rash may simply cause redness and itching, or it may also progress to blisters which may break open. These must be well cared for to prevent secondary bacterial (germ) infection, followed by scarring. Keep any open areas dry, clean, dressed, and covered with an antibacterial ointment if needed. The eyes may also get puffy. The puffiness is worst in the morning and gets better as the day progresses. This dermatitis usually heals without scarring, within 2 to 3 weeks without treatment. HOME CARE INSTRUCTIONS  Thoroughly wash with soap and water as soon as you have been exposed to poison ivy. You have about one half hour to remove the plant resin before it will cause the rash. This washing will destroy the oil or antigen on the skin that is causing, or will cause, the rash. Be sure to wash under your fingernails as any plant resin there will continue to spread the rash. Do not rub skin vigorously when washing affected area. Poison ivy cannot spread if no oil from the plant remains on your body. A rash that has progressed to weeping sores will not spread the rash unless you have not washed thoroughly. It is also important to wash any clothes you have been wearing as these may carry active allergens. The rash will return if you wear the unwashed clothing, even several days later. Avoidance  of the plant in the future is the best measure. Poison ivy plant can be recognized by the number of leaves. Generally, poison ivy has three leaves with flowering branches on a single stem. Diphenhydramine may be purchased over the counter and used as needed for itching. Do not drive with this medication if it makes you drowsy.Ask your caregiver about medication for children. SEEK MEDICAL CARE IF:  Open sores develop.  Redness spreads beyond area of rash.  You notice purulent (pus-like) discharge.  You have increased pain.  Other signs of infection develop (such as fever).   This information is not intended to replace advice given to you by your health care provider. Make sure you discuss any questions you have with your health care provider.   Document Released: 07/25/2000 Document Revised: 10/20/2011 Document Reviewed: 01/03/2015 Elsevier Interactive Patient Education [employer].

## 2016-01-21 NOTE — Progress Notes (Signed)
Pre visit review using our clinic review tool, if applicable. No additional management support is needed unless otherwise documented below in the visit note. 

## 2016-01-23 ENCOUNTER — Telehealth: Payer: Self-pay

## 2016-01-23 DIAGNOSIS — L237 Allergic contact dermatitis due to plants, except food: Secondary | ICD-10-CM

## 2016-01-23 MED ORDER — PREDNISONE 20 MG PO TABS: ORAL_TABLET | ORAL | Status: DC

## 2016-01-23 NOTE — Telephone Encounter (Signed)
He does not need a change in cream, he needs a prednisone taper which was sent into his pharmacy. He is to take 3 tablets for 3 days, then 2 tablets for 3 days, then 1 tablet for 3 days.

## 2016-01-23 NOTE — Telephone Encounter (Signed)
Patient returned Chan's call.  Call patient at [phone].

## 2016-01-23 NOTE — Telephone Encounter (Signed)
Spoken and notified patient of Kate's comments. Patient verbalized understanding. 

## 2016-01-23 NOTE — Telephone Encounter (Signed)
Pt left v/m; pt was seen on 01/21/16 with poison ivy; rash on arms still red; pt got cream but not helping rash on arms. Pt wants to know if different cream could be sent to [facility] or should pt come in for injection. Pt request cb.

## 2016-02-01 NOTE — Telephone Encounter (Signed)
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 FYI to Allie Bossier NP.

## 2016-06-12 ENCOUNTER — Encounter: Payer: Self-pay | Admitting: *Deleted

## 2016-07-10 ENCOUNTER — Encounter: Payer: Self-pay | Admitting: Internal Medicine

## 2017-01-07 ENCOUNTER — Encounter (INDEPENDENT_AMBULATORY_CARE_PROVIDER_SITE_OTHER): Payer: Self-pay

## 2017-01-07 ENCOUNTER — Encounter: Payer: Self-pay | Admitting: Primary Care

## 2017-01-07 ENCOUNTER — Ambulatory Visit (INDEPENDENT_AMBULATORY_CARE_PROVIDER_SITE_OTHER): Payer: Commercial Managed Care - PPO | Admitting: Primary Care

## 2017-01-07 VITALS — BP 124/80 | HR 71 | Temp 98.2°F | Ht 69.25 in | Wt 187.0 lb

## 2017-01-07 DIAGNOSIS — R7303 Prediabetes: Secondary | ICD-10-CM | POA: Diagnosis not present

## 2017-01-07 DIAGNOSIS — Z0000 Encounter for general adult medical examination without abnormal findings: Secondary | ICD-10-CM

## 2017-01-07 NOTE — Progress Notes (Signed)
Subjective:    Patient ID: Cody Pearson, male    DOB: 01/05/1951, 66 y.o.   MRN: [id number]  HPI  Cody Pearson is a 66 year old male who presents today for complete physical.  Immunizations: -Tetanus: Completed in 2012 -Influenza: Did receive last season -Shingles: Declines   Diet: He endorses a healthy diet Breakfast: Carnation Breakfast Lunch: Sandwich Dinner: Restaurants, hamburgers, breakfast food Snacks: Candy Desserts: Daily Beverages: Soda, water, juice, occasional beer  Exercise: Active at work, does not exercise Eye exam: Completed in 2017 Dental exam: Completes semi-annually Colonoscopy: Completed in 2015 PSA: Normal   Review of Systems  Constitutional: Negative for unexpected weight change.  HENT: Negative for rhinorrhea.   Respiratory: Negative for cough and shortness of breath.   Cardiovascular: Negative for chest pain.  Gastrointestinal: Negative for constipation and diarrhea.  Genitourinary: Negative for difficulty urinating.  Musculoskeletal: Negative for arthralgias and myalgias.  Skin: Negative for rash.  Allergic/Immunologic: Negative for environmental allergies.  Neurological: Negative for dizziness, numbness and headaches.  Psychiatric/Behavioral:       Denies concerns for anxiety or depression       Past Medical History:  Diagnosis Date  . History of colonic polyps      Social History   Social History  . Marital status: Married    Spouse name: N/A  . Number of children: 3  . 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 Smoking status: Never Smoker  . Smokeless tobacco: Never Used  . Alcohol use No  . Drug use: No  . Sexual activity: Not on file   Other Topics Concern  . 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   Highest level of education 12th grade.   Married.   Lives locally.       No past  surgical history on file.  Family History  Problem Relation Age of Onset  . Aneurysm Paternal Grandmother     No Known Allergies  No current outpatient prescriptions on file prior to visit.   No current facility-administered medications on file prior to visit.     BP 124/80 (BP Location: Left Arm, Patient Position: Sitting, Cuff Size: Normal)   Pulse 71   Temp 98.2 F (36.8 C) (Oral)   Ht 5' 9.25" (1.759 m)   Wt 187 lb (84.8 kg)   SpO2 96%   BMI 27.42 kg/m    Objective:   Physical Exam  Constitutional: He is oriented to person, place, and time. He appears well-nourished.  HENT:  Right Ear: Tympanic membrane and ear canal normal.  Left Ear: Tympanic membrane and ear canal normal.  Nose: Nose normal. Right sinus exhibits no maxillary sinus tenderness and no frontal sinus tenderness. Left sinus exhibits no maxillary sinus tenderness and no frontal sinus tenderness.  Mouth/Throat: Oropharynx is clear and moist.  Eyes: Conjunctivae and EOM are normal. Pupils are equal, round, and reactive to light.  Neck: Neck supple. Carotid bruit is not present. No thyromegaly present.  Cardiovascular: Normal rate, regular rhythm and normal heart sounds.   Pulmonary/Chest: Effort normal and breath sounds normal. He has no wheezes. He has no rales.  Abdominal: Soft. Bowel sounds are normal. There is no tenderness.  Musculoskeletal: Normal range of motion.  Neurological: He is alert and oriented to person, place, and time. He has normal reflexes. No cranial nerve deficit.  Skin: Skin is warm and dry.  Psychiatric: He has a normal mood and affect.          Assessment & Plan:

## 2017-01-07 NOTE — Assessment & Plan Note (Signed)
Repeat A1C today, discussed to limit candy/junk food.

## 2017-01-07 NOTE — Assessment & Plan Note (Signed)
Immunizations UTD. Declines shingrix vaccination. PSA UTD, due in 2019. Colonoscopy UTD. Discussed to increase consumption of vegetables, fruit, whole grains. Limit sweets and sodas. Exam unremarkable. Labs pending, will return when fasting. Follow up in 1 year.

## 2017-01-07 NOTE — Patient Instructions (Signed)
Schedule a lab only appointment to return fasting for labs at your convenience. You must be fasting four hours prior to this appointment. You may have water and black coffee.  Continue exercising. You should be getting 150 minutes of moderate intensity exercise weekly.  It's important to improve your diet by reducing consumption of fast food, fried food, processed snack foods, sugary drinks. Increase consumption of fresh vegetables and fruits, whole grains, water.  Ensure you are drinking 64 ounces of water daily.  Follow up in 1 year for your annual exam or sooner if needed.  It was a pleasure to see you today!

## 2017-01-08 ENCOUNTER — Other Ambulatory Visit (INDEPENDENT_AMBULATORY_CARE_PROVIDER_SITE_OTHER): Payer: Commercial Managed Care - PPO

## 2017-01-08 DIAGNOSIS — Z0000 Encounter for general adult medical examination without abnormal findings: Secondary | ICD-10-CM | POA: Diagnosis not present

## 2017-01-08 DIAGNOSIS — R7303 Prediabetes: Secondary | ICD-10-CM | POA: Diagnosis not present

## 2017-01-09 LAB — COMPREHENSIVE METABOLIC PANEL
ALBUMIN: 4.4 g/dL (ref 3.5–5.2)
ALK PHOS: 59 U/L (ref 39–117)
ALT: 19 U/L (ref 0–53)
AST: 22 U/L (ref 0–37)
BILIRUBIN TOTAL: 0.6 mg/dL (ref 0.2–1.2)
BUN: 16 mg/dL (ref 6–23)
CALCIUM: 9.3 mg/dL (ref 8.4–10.5)
CO2: 26 mEq/L (ref 19–32)
Chloride: 105 mEq/L (ref 96–112)
Creatinine, Ser: 1.22 mg/dL (ref 0.40–1.50)
GFR: 63.47 mL/min (ref >60.00)
Glucose, Bld: 89 mg/dL (ref 70–99)
POTASSIUM: 3.9 meq/L (ref 3.5–5.1)
Sodium: 140 mEq/L (ref 135–145)
TOTAL PROTEIN: 6.7 g/dL (ref 6.0–8.3)

## 2017-01-09 LAB — LIPID PANEL
CHOL/HDL RATIO: 4
CHOLESTEROL: 202 mg/dL — AB (ref 0–200)
HDL: 45 mg/dL (ref >39.00)
LDL Cholesterol: 138 mg/dL — ABNORMAL HIGH (ref 0–99)
NonHDL: 156.9
TRIGLYCERIDES: 97 mg/dL (ref 0.0–149.0)
VLDL: 19.4 mg/dL (ref 0.0–40.0)

## 2017-01-09 LAB — HEMOGLOBIN A1C: HEMOGLOBIN A1C: 5.9 % (ref 4.6–6.5)

## 2017-01-12 ENCOUNTER — Encounter: Payer: Self-pay | Admitting: *Deleted

## 2017-04-24 ENCOUNTER — Ambulatory Visit (INDEPENDENT_AMBULATORY_CARE_PROVIDER_SITE_OTHER): Payer: Commercial Managed Care - PPO | Admitting: Primary Care

## 2017-04-24 ENCOUNTER — Encounter: Payer: Self-pay | Admitting: Primary Care

## 2017-04-24 ENCOUNTER — Telehealth: Payer: Self-pay | Admitting: *Deleted

## 2017-04-24 VITALS — BP 116/78 | HR 77 | Temp 98.3°F | Ht 69.25 in | Wt 190.8 lb

## 2017-04-24 DIAGNOSIS — M25561 Pain in right knee: Secondary | ICD-10-CM

## 2017-04-24 MED ORDER — NAPROXEN 500 MG PO TABS: 500.0000 mg | ORAL_TABLET | Freq: Two times a day (BID) | ORAL | 0 refills | Status: DC

## 2017-04-24 NOTE — Patient Instructions (Signed)
Your knee pain is likely from an arthritic flare causing inflammation and fluid accumulation. This was caused by the trauma to your knee.  Start naproxen tablets for pain, inflammation, and swelling. Take 1 tablet by mouth twice daily with food.   Continue to ice the knee. Wear the brace as discussed. Refrain from remaining still too much as this will cause stiffness.  Please call me if no improvement in 1 week.  It was a pleasure to see you today!

## 2017-04-24 NOTE — Telephone Encounter (Signed)
Noted. Left voicemail for [facility] to cancel the script. Already sent to [facility]

## 2017-04-24 NOTE — Telephone Encounter (Signed)
Patient left a voicemail that he saw Anda Kraft this morning and she sent a script to [facility] and they are closed. Patient requested that script be resent to [facility].  Nicole Kindred from [facility] left a voicemail stating that patient was sent earlier today and needs script sent to them instead of [facility].  Spoke to [place] and was advised that she will resend script to [facility] and leave a message at [facility] to cancel the script there.

## 2017-04-24 NOTE — Addendum Note (Signed)
Addended by: Jacqualin Combes on: 04/24/2017 10:07 AM   Modules accepted: Orders

## 2017-04-24 NOTE — Progress Notes (Signed)
   Subjective:    Patient ID: Cody Pearson, male    DOB: 1951/08/09, 66 y.o.   MRN: [id number]  HPI  Mr. Walraven is a 66 year old male who presents today with a chief complaint of knee pain. His pain is located to the right knee at and around the anterior patella. He's also noticed swelling. His pain has been present for the past 1 year which has been manageable. He bumped his right knee on the surface of his wood framed bed 6 days ago, since then he's noticed swelling and increased pain. He's been using topical OTC products and wearing a knee brace with some improvement. Overall he's noticed improvement. Denies weakness, erythema, numbness/tingling.   Review of Systems  Musculoskeletal: Positive for arthralgias.       Right knee pain and swelling  Skin: Negative for color change.  Neurological: Negative for weakness and numbness.       Past Medical History:  Diagnosis Date  . History of colonic polyps      Social History   Social History  . Marital status: Married    Spouse name: N/A  . Number of children: 3  . 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 Smoking status: Never Smoker  . Smokeless tobacco: Never Used  . Alcohol use No  . Drug use: No  . Sexual activity: Not on file   Other Topics Concern  . 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   Highest level of education 12th grade.   Married.   Lives locally.       No past surgical history on file.  Family History  Problem Relation Age of Onset  . Aneurysm Paternal Grandmother     No Known Allergies  No current outpatient prescriptions on file prior to visit.   No current facility-administered medications on file prior to visit.     BP 116/78   Pulse 77   Temp 98.3 F (36.8 C) (Oral)   Ht 5' 9.25" (1.759 m)   Wt 190 lb 12.8 oz (86.5 kg)   SpO2 96%   BMI 27.97 kg/m     Objective:   Physical Exam  Constitutional: He appears well-nourished.  Musculoskeletal:       Right knee: He exhibits decreased range of motion and swelling. He exhibits no erythema. No tenderness found.  Mild decrease in ROM when ambulating to right anterior knee. 5/5 strength to bilateral lower extremities. Mild swelling.  Skin: Skin is warm and dry. No erythema.          Assessment & Plan:  Acute Knee Pain:  Located to right knee since injury 6 days ago. Exam today consistent for acute arthritic flare, bursitis. No alarm signs. Discussed to continue wearing the brace, icing, stretching. Rx for Naproxen 500 mg BID course sent to pharmacy. He will follow up in 1 week if no improvement.  Sheral Flow, NP

## 2017-04-27 ENCOUNTER — Ambulatory Visit: Payer: Commercial Managed Care - PPO | Admitting: Family Medicine

## 2017-04-27 ENCOUNTER — Telehealth: Payer: Self-pay

## 2017-04-27 DIAGNOSIS — M25561 Pain in right knee: Secondary | ICD-10-CM

## 2017-04-27 NOTE — Telephone Encounter (Signed)
Pt left v/m; pt was seen 04/24/17 with knee pain; pain is no better and wants to know if could get xrays done or see specialist; pt having hard time walking. Pt request cb.

## 2017-04-27 NOTE — Telephone Encounter (Signed)
Please notify patient that I have set up an x-ray of his knee for further evaluation of his pain. He may come in at his convenience to have this done. Once I have the results we will go from there.

## 2017-04-28 NOTE — Telephone Encounter (Signed)
Message left for patient to return my call.  

## 2017-04-28 NOTE — Telephone Encounter (Signed)
Spoken and notified patient of Kate's comments. Patient verbalized understanding. 

## 2017-04-29 ENCOUNTER — Ambulatory Visit (INDEPENDENT_AMBULATORY_CARE_PROVIDER_SITE_OTHER)
Admission: RE | Admit: 2017-04-29 | Discharge: 2017-04-29 | Disposition: A | Discharge status: Home / Self Care | Payer: Commercial Managed Care - PPO | Source: Ambulatory Visit | Attending: Primary Care | Admitting: Primary Care

## 2017-04-29 DIAGNOSIS — M25561 Pain in right knee: Secondary | ICD-10-CM

## 2017-05-04 ENCOUNTER — Telehealth: Payer: Self-pay | Admitting: Primary Care

## 2017-05-04 NOTE — Telephone Encounter (Signed)
Caller Name:Nussen Hardin Negus Relationship to Patient:self Best [phone] Pharmacy:  Reason for call:  Pt would like return call about knee

## 2017-05-04 NOTE — Telephone Encounter (Signed)
Spoken and notified patient of Kate's comments. Patient verbalized understanding. 

## 2017-05-11 ENCOUNTER — Encounter: Payer: Self-pay | Admitting: Family Medicine

## 2017-05-11 ENCOUNTER — Ambulatory Visit (INDEPENDENT_AMBULATORY_CARE_PROVIDER_SITE_OTHER): Payer: Commercial Managed Care - PPO | Admitting: Family Medicine

## 2017-05-11 ENCOUNTER — Ambulatory Visit: Payer: Commercial Managed Care - PPO | Admitting: Family Medicine

## 2017-05-11 VITALS — BP 118/70 | HR 81 | Temp 97.6°F | Wt 196.5 lb

## 2017-05-11 DIAGNOSIS — L237 Allergic contact dermatitis due to plants, except food: Secondary | ICD-10-CM

## 2017-05-11 MED ORDER — TRIAMCINOLONE ACETONIDE 0.1 % EX CREA: 1.0000 "application " | TOPICAL_CREAM | Freq: Two times a day (BID) | CUTANEOUS | 0 refills | Status: DC

## 2017-05-11 MED ORDER — DEXAMETHASONE SODIUM PHOSPHATE 10 MG/ML IJ SOLN
10.0000 mg | Freq: Once | INTRAMUSCULAR | Status: AC
  Administered 2017-05-11: 10 mg via INTRAMUSCULAR

## 2017-05-11 NOTE — Patient Instructions (Signed)
Get flu shot at work. Treat with steroid shot today, then topical steroid to affected areas. May also use calomine lotion or anti itch spray as needed.   Poison Ivy Dermatitis Poison ivy dermatitis is inflammation of the skin that is caused by the allergens on the leaves of the poison ivy plant. The skin reaction often involves redness, swelling, blisters, and extreme itching. What are the causes? This condition is caused by a specific chemical (urushiol) found in the sap of the poison ivy plant. This chemical is sticky and can be easily spread to people, animals, and objects. You can get poison ivy dermatitis by:  Having direct contact with a poison ivy plant.  Touching animals, other people, or objects that have come in contact with poison ivy and have the chemical on them.  What increases the risk? This condition is more likely to develop in:  People who are outdoors often.  People who go outdoors without wearing protective clothing, such as closed shoes, long pants, and a long-sleeved shirt.  What are the signs or symptoms? Symptoms of this condition include:  Redness and itching.  A rash that often includes bumps and blisters. The rash usually appears 48 hours after exposure.  Swelling. This may occur if the reaction is more severe.  Symptoms usually last for 1-2 weeks. However, the first time you develop this condition, symptoms may last 3-4 weeks. How is this diagnosed? This condition may be diagnosed based on your symptoms and a physical exam. Your health care provider may also ask you about any recent outdoor activity. How is this treated? Treatment for this condition will vary depending on how severe it is. Treatment may include:  Hydrocortisone creams or calamine lotions to relieve itching.  Oatmeal baths to soothe the skin.  Over-the-counter antihistamine tablets.  Oral steroid medicine for more severe outbreaks.  Follow these instructions at home:  Take or  apply over-the-counter and prescription medicines only as told by your health care provider.  Wash exposed skin as soon as possible with soap and cold water.  Use hydrocortisone creams or calamine lotion as needed to soothe the skin and relieve itching.  Take oatmeal baths as needed. Use colloidal oatmeal. You can get this at your local pharmacy or grocery store. Follow the instructions on the packaging.  Do not scratch or rub your skin.  While you have the rash, wash clothes right after you wear them. How is this prevented?  Learn to identify the poison ivy plant and avoid contact with the plant. This plant can be recognized by the number of leaves. Generally, poison ivy has three leaves with flowering branches on a single stem. The leaves are typically glossy, and they have jagged edges that come to a point at the front.  If you have been exposed to poison ivy, thoroughly wash with soap and water right away. You have about 30 minutes to remove the plant resin before it will cause the rash. Be sure to wash under your fingernails because any plant resin there will continue to spread the rash.  When hiking or camping, wear clothes that will help you to avoid exposure on the skin. This includes long pants, a long-sleeved shirt, tall socks, and hiking boots. You can also apply preventive lotion to your skin to help limit exposure.  If you suspect that your clothes or outdoor gear came in contact with poison ivy, rinse them off outside with a garden hose before you bring them inside your house. Contact  a health care provider if:  You have open sores in the rash area.  You have more redness, swelling, or pain in the affected area.  You have redness that spreads beyond the rash area.  You have fluid, blood, or pus coming from the affected area.  You have a fever.  You have a rash over a large area of your body.  You have a rash on your eyes, mouth, or genitals.  Your rash does not  improve after a few days. Get help right away if:  Your face swells or your eyes swell shut.  You have trouble breathing.  You have trouble swallowing. This information is not intended to replace advice given to you by your health care provider. Make sure you discuss any questions you have with your health care provider. Document Released: 07/25/2000 Document Revised: 01/03/2016 Document Reviewed: 01/03/2015 Elsevier Interactive Patient Education  [employer].

## 2017-05-11 NOTE — Progress Notes (Signed)
   BP 118/70 (BP Location: Left Arm, Patient Position: Sitting, Cuff Size: Normal)   Pulse 81   Temp 97.6 F (36.4 C) (Oral)   Wt 196 lb 8 oz (89.1 kg)   SpO2 98%   BMI 28.81 kg/m    CC: poison ivy Subjective:    Patient ID: Cody Pearson, male    DOB: 1951-04-03, 66 y.o.   MRN: [id number]  HPI: [name] is a 66 y.o. male presenting on 05/11/2017 for Poison Ivy (Rash on bilateral UEs. Started last week)   "I got into poison ivy". Over a week ago. While working on weeds in front yard. He is highly allergic to this.  L medial forearm, R forearm and popliteal fossa, L leg and L thigh.  Treating with neosporin.   Will get flu shot at work.   Relevant past medical, surgical, family and social history reviewed and updated as indicated. Interim medical history since our last visit reviewed. Allergies and medications reviewed and updated. Outpatient Medications Prior to Visit  Medication Sig Dispense Refill  . naproxen (NAPROSYN) 500 MG tablet Take 1 tablet (500 mg total) by mouth 2 (two) times daily with a meal. 30 tablet 0   No facility-administered medications prior to visit.      Per HPI unless specifically indicated in ROS section below Review of Systems     Objective:    BP 118/70 (BP Location: Left Arm, Patient Position: Sitting, Cuff Size: Normal)   Pulse 81   Temp 97.6 F (36.4 C) (Oral)   Wt 196 lb 8 oz (89.1 kg)   SpO2 98%   BMI 28.81 kg/m   Wt Readings from Last 3 Encounters:  05/11/17 196 lb 8 oz (89.1 kg)  04/24/17 190 lb 12.8 oz (86.5 kg)  01/07/17 187 lb (84.8 kg)    Physical Exam  Constitutional: He appears well-developed and well-nourished. No distress.  Skin: Skin is warm and dry. Rash noted. There is erythema.  Angry erythematous blistering rash R medial forearm, less erythematous rash L AC area, erythematous rash L shin  Nursing note and vitals reviewed.  Lab Results  Component Value Date   HGBA1C 5.9 01/08/2017       Assessment &  Plan:   Problem List Items Addressed This Visit    Contact dermatitis due to poison ivy - Primary    Treat with dexamethasone 10mg  IM shot then TCI cream to pharmacy. Update if not improving with treatment. Handout provided.       Other Visit Diagnoses    Poison ivy dermatitis       Relevant Medications   triamcinolone cream (KENALOG) 0.1 %       Follow up plan: Return if symptoms worsen or fail to improve.  Ria Bush, MD

## 2017-05-11 NOTE — Addendum Note (Signed)
Addended by: Brenton Grills on: 74/08/6382 53:64 AM   Modules accepted: Orders

## 2017-05-11 NOTE — Assessment & Plan Note (Signed)
Treat with dexamethasone 10mg  IM shot then TCI cream to pharmacy. Update if not improving with treatment. Handout provided.

## 2017-05-18 ENCOUNTER — Ambulatory Visit (INDEPENDENT_AMBULATORY_CARE_PROVIDER_SITE_OTHER): Payer: Commercial Managed Care - PPO | Admitting: Family Medicine

## 2017-05-18 ENCOUNTER — Encounter: Payer: Self-pay | Admitting: Family Medicine

## 2017-05-18 VITALS — BP 110/80 | HR 72 | Temp 97.4°F | Ht 69.25 in | Wt 192.5 lb

## 2017-05-18 DIAGNOSIS — M1711 Unilateral primary osteoarthritis, right knee: Secondary | ICD-10-CM

## 2017-05-18 DIAGNOSIS — M25561 Pain in right knee: Secondary | ICD-10-CM

## 2017-05-18 MED ORDER — METHYLPREDNISOLONE ACETATE 40 MG/ML IJ SUSP
40.0000 mg | Freq: Once | INTRAMUSCULAR | Status: AC
  Administered 2017-05-18: 40 mg via INTRAMUSCULAR

## 2017-05-18 NOTE — Progress Notes (Signed)
Dr. Frederico Hamman T. Ananda Caya, MD, [facility] [place], [address] Phone: [phone] Fax: [phone]  05/18/2017  Patient: Cody Pearson, MRN: [id number], DOB: Sep 03, 1950, 66 y.o.  Primary Physician:  Pleas Koch, NP   Chief Complaint  Patient presents with  . Knee Pain    Right   Subjective:   FERNANDO STOIBER is a 66 y.o. very pleasant male patient who presents with the following:  2003, had a knee scope.  Feels like had a lot of pain that primarily involves the right knee.  He doesn't clear currently have any specific event that he can think of that he could've hurt his knee.  He does work in Theatre manager at [employer], so he is down on his knees almost all the time.  He does have some mild prepatellar puffiness, but does not have any significant effusion.  He is not any mechanical buckling, and he has not had any locking up as joint.  Does have some known moderate osteoarthritis of the right knee.  inj r knee vagal  Past Medical History, Surgical History, Social History, Family History, Problem List, Medications, and Allergies have been reviewed and updated if relevant.  Patient Active Problem List   Diagnosis Date Noted  . Contact dermatitis due to poison ivy 05/11/2017  . Borderline diabetes 10/15/2015  . Preventative health care 12/20/2014  . Blood in stool 06/17/2013    Past Medical History:  Diagnosis Date  . History of colonic polyps     No past surgical history on file.  Social History   Social History  . Marital status: Married    Spouse name: N/A  . Number of children: 3  . 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 Smoking status: Never Smoker  . Smokeless tobacco: Never Used  . Alcohol use No  . Drug use: No  . Sexual activity: Not on file   Other Topics Concern  . 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   Highest level of education 12th grade.   Married.   Lives locally.       Family History  Problem Relation Age of Onset  . Aneurysm Paternal Grandmother     No Known Allergies  Medication list reviewed and updated in full in [place].  GEN: No fevers, chills. Nontoxic. Primarily MSK c/o today. MSK: Detailed in the HPI GI: tolerating PO intake without difficulty Neuro: No numbness, parasthesias, or tingling associated. Otherwise the pertinent positives of the ROS are noted above.   Objective:   BP 110/80   Pulse 72   Temp (!) 97.4 F (36.3 C) (Oral)   Ht 5' 9.25" (1.759 m)   Wt 192 lb 8 oz (87.3 kg)   BMI 28.22 kg/m    GEN: WDWN, NAD, Non-toxic, Alert & Oriented x 3 HEENT: Atraumatic, Normocephalic.  Ears and Nose: No external deformity. EXTR: No clubbing/cyanosis/edema NEURO: Normal gait.  PSYCH: Normally interactive. Conversant. Not depressed or anxious appearing.  Calm demeanor.   Knee:  R Gait: Normal heel toe pattern ROM: 0-120 Effusion: neg Echymosis or edema: none Patellar tendon NT Painful PLICA: neg Patellar grind: negative Medial and lateral patellar facet loading: negative medial and lateral joint lines: medial joint line Mcmurray's + pain Flexion-pinch pos Varus and valgus stress: stable  Lachman: neg Ant and Post drawer: neg Hip abduction, IR, ER: WNL Hip flexion str: 5/5 Hip abd: 5/5 Quad: 5/5 VMO atrophy:No Hamstring concentric and eccentric: 5/5   Radiology: Dg Knee Complete 4 Views Right  Result Date: 04/30/2017 CLINICAL DATA:  Right patellar pain EXAM: RIGHT KNEE - COMPLETE 4+ VIEW COMPARISON:  None. FINDINGS: No acute fracture. No dislocation. Moderate tricompartment osteoarthritic change. Small bony densities project over the anterior knee joint, posterior to the knee joint, and in the suprapatellar joint recess. Mild soft tissue swelling anterior to the patella.  IMPRESSION: No acute bony pathology. Chronic changes. Soft tissue swelling anterior to the patella. Electronically Signed   By: Marybelle Killings M.D.   On: 04/30/2017 09:19    Assessment and Plan:   Acute pain of right knee  Primary osteoarthritis of right knee - Plan: methylPREDNISolone acetate (DEPO-MEDROL) injection 40 mg  Greatest pain is along the posterior medial joint line, and exam is most consistent with probable meniscal tear.  Certainly the patient has significant arthritis, which could be the primary component to his pain also.  We are going to try to give him some more time as well as do a knee injection in conjunction with his oral NSAIDs to try to calm his knee down. He is also going to wear a knee brace for working.  Knee Injection, R Patient verbally consented to procedure. Risks (including potential rare risk of infection), benefits, and alternatives explained. Sterilely prepped with Chloraprep. Ethyl cholride used for anesthesia. 8 cc Lidocaine 1% mixed with 2 mL Depo-Medrol 40 mg injected using the anteromedial approach without difficulty. No complications with procedure and tolerated well. Patient had decreased pain post-injection.   Follow-up: Return in about 6 weeks (around 06/29/2017).  Future Appointments Date Time Provider [place]  07/13/2017 8:00 AM Amed Datta, Frederico Hamman, MD [id number]    Meds ordered this encounter  Medications  . naproxen sodium (ANAPROX) 220 MG tablet    Sig: Take 440 mg by mouth 2 (two) times daily with a meal.  . methylPREDNISolone acetate (DEPO-MEDROL) injection 40 mg   Medications Discontinued During This Encounter  Medication Reason  . naproxen (NAPROSYN) 250 MG tablet Completed Course   Signed,  Earlene Bjelland T. Sheera Illingworth, MD   Patient's Medications  New Prescriptions   No medications on file  Previous Medications   NAPROXEN SODIUM (ANAPROX) 220 MG TABLET    Take 440 mg by mouth 2 (two) times daily with a meal.    TRIAMCINOLONE CREAM (KENALOG) 0.1 %    Apply 1 application topically 2 (two) times daily.  Modified Medications   No medications on file  Discontinued Medications   NAPROXEN (NAPROSYN) 250 MG TABLET    Take by mouth 2 (two) times daily with a meal.

## 2017-06-10 ENCOUNTER — Ambulatory Visit (INDEPENDENT_AMBULATORY_CARE_PROVIDER_SITE_OTHER): Payer: Commercial Managed Care - PPO | Admitting: Family Medicine

## 2017-06-10 ENCOUNTER — Encounter: Payer: Self-pay | Admitting: Family Medicine

## 2017-06-10 VITALS — BP 104/68 | HR 78 | Temp 98.3°F | Ht 69.25 in | Wt 193.5 lb

## 2017-06-10 DIAGNOSIS — L237 Allergic contact dermatitis due to plants, except food: Secondary | ICD-10-CM

## 2017-06-10 DIAGNOSIS — M25561 Pain in right knee: Secondary | ICD-10-CM | POA: Diagnosis not present

## 2017-06-10 MED ORDER — TRIAMCINOLONE ACETONIDE 0.1 % EX CREA: 1.0000 "application " | TOPICAL_CREAM | Freq: Two times a day (BID) | CUTANEOUS | 0 refills | Status: DC

## 2017-06-10 NOTE — Progress Notes (Signed)
Dr. Frederico Hamman T. Taylee Gunnells, MD, [facility] [place], [address] Phone: [phone] Fax: [phone]  06/10/2017  Patient: Cody Pearson, MRN: [id number], DOB: [birth year], 66 y.o.  Primary Physician:  Pleas Koch, NP   Chief Complaint  Patient presents with  . Knee Pain    Right   Subjective:   Cody Pearson is a 66 y.o. very pleasant male patient who presents with the following:  F/u R knee: s/p injection 05/18/2017  Pleasant gentleman who's had some intermittent knee pain for about a year, but he has had some acute knee pain for about 2 months. Since that time he said pain relatively diffusely, but also some medially. Thus far his failed all conservative management. He continues have persistent symptoms are bothering him on a daily basis and limiting his work.  Past Medical History, Surgical History, Social History, Family History, Problem List, Medications, and Allergies have been reviewed and updated if relevant.  Patient Active Problem List   Diagnosis Date Noted  . Contact dermatitis due to poison ivy 05/11/2017  . Borderline diabetes 10/15/2015  . Preventative health care 12/20/2014  . Blood in stool 06/17/2013    Past Medical History:  Diagnosis Date  . History of colonic polyps     No past surgical history on file.  Social History   Social History  . Marital status: Married    Spouse name: N/A  . Number of children: 3  . 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 Smoking status: Never Smoker  . Smokeless tobacco: Never Used  . Alcohol use No  . Drug use: No  . Sexual activity: Not on file   Other Topics Concern  . 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   Highest level of education 12th grade.   Married.   Lives locally.       Family History  Problem  Relation Age of Onset  . Aneurysm Paternal Grandmother     No Known Allergies  Medication list reviewed and updated in full in [place].  GEN: No fevers, chills. Nontoxic. Primarily MSK c/o today. MSK: Detailed in the HPI GI: tolerating PO intake without difficulty Neuro: No numbness, parasthesias, or tingling associated. Otherwise the pertinent positives of the ROS are noted above.   Objective:   BP 104/68   Pulse 78   Temp 98.3 F (36.8 C) (Oral)   Ht 5' 9.25" (1.759 m)   Wt 193 lb 8 oz (87.8 kg)   BMI 28.37 kg/m    GEN: WDWN, NAD, Non-toxic, Alert & Oriented x 3 HEENT: Atraumatic, Normocephalic.  Ears and Nose: No external deformity. EXTR: No clubbing/cyanosis/edema NEURO: Normal gait.  PSYCH: Normally interactive. Conversant. Not depressed or anxious appearing.  Calm demeanor.    Right knee: Full extension. Flexion to 150. Normal patellar motion. Posterior medial joint line tenderness. No significant lateral joint line tenderness. Stable MCL. Question prior and LCL injury with greater opening. Anterior cruciate ligament andPCL are intact. McMurray's is positive in flexion pinch are positive.  Radiology: Dg Knee Complete 4 Views Right  Result Date: 04/30/2017 CLINICAL DATA:  Right patellar pain EXAM: RIGHT KNEE - COMPLETE 4+ VIEW COMPARISON:  None. FINDINGS: No acute fracture. No dislocation. Moderate tricompartment osteoarthritic change. Small bony densities project over the anterior  knee joint, posterior to the knee joint, and in the suprapatellar joint recess. Mild soft tissue swelling anterior to the patella. IMPRESSION: No acute bony pathology. Chronic changes. Soft tissue swelling anterior to the patella. Electronically Signed   By: Marybelle Killings M.D.   On: 04/30/2017 09:19   Assessment and Plan:   Acute pain of right knee - Plan: MR Knee Right Wo Contrast  Poison ivy dermatitis - Plan: triamcinolone cream (KENALOG) 0.1 %  Probable internal derangement,  right knee. Failure to conservative with conservative management greater than 6 weeks with concern for potential medial meniscal tear. Obtain MRI of the right knee to evaluate for internal derangement.  Follow-up: No Follow-up on file.  Future Appointments Date Time Provider [place]  06/22/2017 8:00 AM [facility]    Meds ordered this encounter  Medications  . triamcinolone cream (KENALOG) 0.1 %    Sig: Apply 1 application topically 2 (two) times daily.    Dispense:  80 g    Refill:  0   Medications Discontinued During This Encounter  Medication Reason  . triamcinolone cream (KENALOG) 0.1 % Reorder   Orders Placed This Encounter  Procedures  . MR Knee Right Wo Contrast    Signed,  Theadora Noyes T. Franciso Dierks, MD   Allergies as of 06/10/2017   No Known Allergies     Medication List       Accurate as of 06/10/17  1:47 PM. Always use your most recent med list.          naproxen sodium 220 MG tablet Commonly known as:  ALEVE Take 440 mg by mouth 2 (two) times daily with a meal.   triamcinolone cream 0.1 % Commonly known as:  KENALOG Apply 1 application topically 2 (two) times daily.

## 2017-06-10 NOTE — Patient Instructions (Signed)

## 2017-06-22 ENCOUNTER — Ambulatory Visit
Admission: RE | Admit: 2017-06-22 | Discharge: 2017-06-22 | Disposition: A | Discharge status: Home / Self Care | Payer: Commercial Managed Care - PPO | Source: Ambulatory Visit | Attending: Family Medicine | Admitting: Family Medicine

## 2017-06-22 DIAGNOSIS — X58XXXA Exposure to other specified factors, initial encounter: Secondary | ICD-10-CM | POA: Diagnosis not present

## 2017-06-22 DIAGNOSIS — S83281A Other tear of lateral meniscus, current injury, right knee, initial encounter: Secondary | ICD-10-CM | POA: Diagnosis not present

## 2017-06-22 DIAGNOSIS — M1711 Unilateral primary osteoarthritis, right knee: Secondary | ICD-10-CM | POA: Insufficient documentation

## 2017-06-22 DIAGNOSIS — S83231A Complex tear of medial meniscus, current injury, right knee, initial encounter: Secondary | ICD-10-CM | POA: Insufficient documentation

## 2017-06-22 DIAGNOSIS — M25461 Effusion, right knee: Secondary | ICD-10-CM | POA: Diagnosis not present

## 2017-06-22 DIAGNOSIS — M25561 Pain in right knee: Secondary | ICD-10-CM | POA: Diagnosis present

## 2017-06-25 ENCOUNTER — Other Ambulatory Visit: Payer: Self-pay | Admitting: Family Medicine

## 2017-06-25 DIAGNOSIS — S83231A Complex tear of medial meniscus, current injury, right knee, initial encounter: Secondary | ICD-10-CM

## 2017-06-25 DIAGNOSIS — M1711 Unilateral primary osteoarthritis, right knee: Secondary | ICD-10-CM

## 2017-07-13 ENCOUNTER — Ambulatory Visit: Payer: Commercial Managed Care - PPO | Admitting: Family Medicine

## 2017-08-05 DIAGNOSIS — M25561 Pain in right knee: Secondary | ICD-10-CM | POA: Diagnosis not present

## 2017-08-06 DIAGNOSIS — D485 Neoplasm of uncertain behavior of skin: Secondary | ICD-10-CM | POA: Diagnosis not present

## 2017-08-06 DIAGNOSIS — Z85820 Personal history of malignant melanoma of skin: Secondary | ICD-10-CM | POA: Diagnosis not present

## 2017-08-06 DIAGNOSIS — Z85828 Personal history of other malignant neoplasm of skin: Secondary | ICD-10-CM | POA: Diagnosis not present

## 2017-08-06 DIAGNOSIS — L821 Other seborrheic keratosis: Secondary | ICD-10-CM | POA: Diagnosis not present

## 2017-08-06 DIAGNOSIS — D2261 Melanocytic nevi of right upper limb, including shoulder: Secondary | ICD-10-CM | POA: Diagnosis not present

## 2017-08-14 DIAGNOSIS — M1711 Unilateral primary osteoarthritis, right knee: Secondary | ICD-10-CM | POA: Diagnosis not present

## 2017-09-01 DIAGNOSIS — J069 Acute upper respiratory infection, unspecified: Secondary | ICD-10-CM | POA: Diagnosis not present

## 2017-09-30 ENCOUNTER — Other Ambulatory Visit: Payer: Self-pay

## 2017-09-30 ENCOUNTER — Ambulatory Visit
Admission: RE | Admit: 2017-09-30 | Discharge: 2017-09-30 | Disposition: A | Discharge status: Home / Self Care | Payer: Commercial Managed Care - PPO | Source: Ambulatory Visit | Attending: Surgery | Admitting: Surgery

## 2017-09-30 ENCOUNTER — Encounter
Admission: RE | Admit: 2017-09-30 | Discharge: 2017-09-30 | Disposition: A | Discharge status: Home / Self Care | Payer: Commercial Managed Care - PPO | Source: Ambulatory Visit | Attending: Surgery | Admitting: Surgery

## 2017-09-30 DIAGNOSIS — R001 Bradycardia, unspecified: Secondary | ICD-10-CM | POA: Insufficient documentation

## 2017-09-30 DIAGNOSIS — Z01810 Encounter for preprocedural cardiovascular examination: Secondary | ICD-10-CM | POA: Insufficient documentation

## 2017-09-30 DIAGNOSIS — Z01818 Encounter for other preprocedural examination: Secondary | ICD-10-CM | POA: Diagnosis not present

## 2017-09-30 HISTORY — DX: Malignant (primary) neoplasm, unspecified: C80.1

## 2017-09-30 HISTORY — DX: Unspecified osteoarthritis, unspecified site: M19.90

## 2017-09-30 LAB — URINALYSIS, ROUTINE W REFLEX MICROSCOPIC
BILIRUBIN URINE: NEGATIVE
GLUCOSE, UA: NEGATIVE mg/dL
HGB URINE DIPSTICK: NEGATIVE
Ketones, ur: NEGATIVE mg/dL
Leukocytes, UA: NEGATIVE
NITRITE: NEGATIVE
PH: 6 (ref 5.0–8.0)
Protein, ur: NEGATIVE mg/dL
SPECIFIC GRAVITY, URINE: 1.013 (ref 1.005–1.030)

## 2017-09-30 LAB — BASIC METABOLIC PANEL
Anion gap: 8 (ref 5–15)
BUN: 16 mg/dL (ref 6–20)
CALCIUM: 9.2 mg/dL (ref 8.9–10.3)
CHLORIDE: 105 mmol/L (ref 101–111)
CO2: 28 mmol/L (ref 22–32)
CREATININE: 1.04 mg/dL (ref 0.61–1.24)
GFR calc Af Amer: >60 mL/min (ref >60)
GFR calc non Af Amer: >60 mL/min (ref >60)
GLUCOSE: 73 mg/dL (ref 65–99)
Potassium: 4.2 mmol/L (ref 3.5–5.1)
Sodium: 141 mmol/L (ref 135–145)

## 2017-09-30 LAB — TYPE AND SCREEN
ABO/RH(D): O POS
Antibody Screen: NEGATIVE

## 2017-09-30 LAB — SURGICAL PCR SCREEN
MRSA, PCR: NEGATIVE
STAPHYLOCOCCUS AUREUS: POSITIVE — AB

## 2017-09-30 LAB — CBC
HEMATOCRIT: 44.6 % (ref 40.0–52.0)
HEMOGLOBIN: 14.7 g/dL (ref 13.0–18.0)
MCH: 30.6 pg (ref 26.0–34.0)
MCHC: 33.1 g/dL (ref 32.0–36.0)
MCV: 92.5 fL (ref 80.0–100.0)
Platelets: 190 10*3/uL (ref 150–440)
RBC: 4.82 MIL/uL (ref 4.40–5.90)
RDW: 13.1 % (ref 11.5–14.5)
WBC: 4.7 10*3/uL (ref 3.8–10.6)

## 2017-09-30 NOTE — Patient Instructions (Signed)
Your procedure is scheduled on: Tuesday 10/13/17 Report to [facility]. To find out your arrival time please call [phone] between 1PM - 3PM on Monday 10/12/17.  Remember: Instructions that are not followed completely may result in serious medical risk, up to and including death, or upon the discretion of your surgeon and anesthesiologist your surgery may need to be rescheduled.     _X__ 1. Do not eat food after midnight the night before your procedure.                 No gum chewing or hard candies. You may drink clear liquids up to 2 hours                 before you are scheduled to arrive for your surgery- DO not drink clear                 liquids within 2 hours of the start of your surgery.                 Clear Liquids include:  water, apple juice without pulp, clear carbohydrate                 drink such as Clearfast of Gartorade, Black Coffee or Tea (Do not add                 anything to coffee or tea).  __X__2.  On the morning of surgery brush your teeth with toothpaste and water, you                 may rinse your mouth with mouthwash if you wish.  Do not swallow any              toothpaste of mouthwash.     _X__ 3.  No Alcohol for 24 hours before or after surgery.   _X__ 4.  Do Not Smoke or use e-cigarettes For 24 Hours Prior to Your Surgery.                 Do not use any chewable tobacco products for at least 6 hours prior to                 surgery.  ____  5.  Bring all medications with you on the day of surgery if instructed.   ____  6.  Notify your doctor if there is any change in your medical condition      (cold, fever, infections).     Do not wear jewelry, make-up, hairpins, clips or nail polish. Do not wear lotions, powders, or perfumes. You may wear deodorant. Do not shave 48 hours prior to surgery. Men may shave face and neck. Do not bring valuables to the hospital.    [facility] is not responsible for  any belongings or valuables.  Contacts, dentures or bridgework may not be worn into surgery. Leave your suitcase in the car. After surgery it may be brought to your room. For patients admitted to the hospital, discharge time is determined by your treatment team.   Patients discharged the day of surgery will not be allowed to drive home.   Please read over the following fact sheets that you were given:   MRSA Information   __X__ Take these medicines the morning of surgery with A SIP OF WATER:    1. none  2.   3.   4.  5.  6.  ____ Fleet Enema (as directed)   __X__ Use CHG Soap as directed  ____ Use inhalers on the day of surgery  ____ Stop metformin/Janumet/Farxiga 2 days prior to surgery    ____ Take 1/2 of usual insulin dose the night before surgery. No insulin the morning          of surgery.   ____ Stop Blood Thinners Coumadin/Plavix/Xarelto/Pleta/Pradaxa/Eliquis/Effient/Aspirin  on   __X__ Stop Anti-inflammatories such as Advil, Ibuprofen, Motrin, BC or Goodies  Powder, Naprosyn, Naproxen, Aleve  May take tylenol   __X__ Stop herbal supplements, fish oil or vitamin E until after surgery.  Multivitamin is OK  ____ Bring C-Pap to the hospital.

## 2017-09-30 NOTE — Pre-Procedure Instructions (Signed)
Faxed MRSA/Staph aureus results to Dr. Nicholaus Bloom office.

## 2017-10-12 MED ORDER — CEFAZOLIN SODIUM-DEXTROSE 2-4 GM/100ML-% IV SOLN
2.0000 g | Freq: Once | INTRAVENOUS | Status: AC
  Administered 2017-10-13: 2 g via INTRAVENOUS

## 2017-10-13 ENCOUNTER — Other Ambulatory Visit: Payer: Self-pay

## 2017-10-13 ENCOUNTER — Observation Stay: Payer: Commercial Managed Care - PPO

## 2017-10-13 ENCOUNTER — Inpatient Hospital Stay: Payer: Commercial Managed Care - PPO | Admitting: Certified Registered"

## 2017-10-13 ENCOUNTER — Inpatient Hospital Stay
Admission: RE | Admit: 2017-10-13 | Discharge: 2017-10-14 | DRG: 470 | Disposition: A | Discharge status: Home / Self Care | Payer: Commercial Managed Care - PPO | Source: Ambulatory Visit | Attending: Surgery | Admitting: Surgery

## 2017-10-13 ENCOUNTER — Encounter
Admission: RE | Disposition: A | Discharge status: Home / Self Care | Payer: Self-pay | Source: Ambulatory Visit | Attending: Surgery

## 2017-10-13 DIAGNOSIS — Z96651 Presence of right artificial knee joint: Secondary | ICD-10-CM

## 2017-10-13 DIAGNOSIS — Z711 Person with feared health complaint in whom no diagnosis is made: Secondary | ICD-10-CM | POA: Diagnosis not present

## 2017-10-13 DIAGNOSIS — M1711 Unilateral primary osteoarthritis, right knee: Secondary | ICD-10-CM | POA: Diagnosis not present

## 2017-10-13 DIAGNOSIS — Z79899 Other long term (current) drug therapy: Secondary | ICD-10-CM | POA: Diagnosis not present

## 2017-10-13 DIAGNOSIS — R011 Cardiac murmur, unspecified: Secondary | ICD-10-CM | POA: Diagnosis present

## 2017-10-13 DIAGNOSIS — Z85820 Personal history of malignant melanoma of skin: Secondary | ICD-10-CM | POA: Diagnosis not present

## 2017-10-13 DIAGNOSIS — Z791 Long term (current) use of non-steroidal anti-inflammatories (NSAID): Secondary | ICD-10-CM | POA: Diagnosis not present

## 2017-10-13 DIAGNOSIS — Z471 Aftercare following joint replacement surgery: Secondary | ICD-10-CM | POA: Diagnosis not present

## 2017-10-13 HISTORY — PX: PARTIAL KNEE ARTHROPLASTY: SHX2174

## 2017-10-13 LAB — ABO/RH: ABO/RH(D): O POS

## 2017-10-13 SURGERY — ARTHROPLASTY, KNEE, UNICOMPARTMENTAL
Anesthesia: Spinal | Site: Knee | Laterality: Right | Wound class: Clean

## 2017-10-13 MED ORDER — MIDAZOLAM HCL 5 MG/5ML IJ SOLN
INTRAMUSCULAR | Status: DC | PRN
  Administered 2017-10-13: 2 mg via INTRAVENOUS

## 2017-10-13 MED ORDER — ONDANSETRON HCL 4 MG/2ML IJ SOLN
INTRAMUSCULAR | Status: DC | PRN
  Administered 2017-10-13: 4 mg via INTRAVENOUS

## 2017-10-13 MED ORDER — BUPIVACAINE-EPINEPHRINE (PF) 0.5% -1:200000 IJ SOLN
INTRAMUSCULAR | Status: DC | PRN
  Administered 2017-10-13: 30 mL via PERINEURAL

## 2017-10-13 MED ORDER — FENTANYL CITRATE (PF) 100 MCG/2ML IJ SOLN
INTRAMUSCULAR | Status: DC | PRN
  Administered 2017-10-13 (×2): 50 ug via INTRAVENOUS

## 2017-10-13 MED ORDER — NEOMYCIN-POLYMYXIN B GU 40-200000 IR SOLN
Status: DC | PRN
  Administered 2017-10-13: 14 mL

## 2017-10-13 MED ORDER — LACTATED RINGERS IV SOLN
INTRAVENOUS | Status: DC
  Administered 2017-10-13: 09:00:00 via INTRAVENOUS
  Administered 2017-10-13: 1000 mL via INTRAVENOUS

## 2017-10-13 MED ORDER — KETOROLAC TROMETHAMINE 30 MG/ML IJ SOLN
30.0000 mg | Freq: Once | INTRAMUSCULAR | Status: AC
  Administered 2017-10-13: 30 mg via INTRAVENOUS

## 2017-10-13 MED ORDER — SODIUM CHLORIDE 0.9 % IV SOLN
INTRAVENOUS | Status: DC | PRN
  Administered 2017-10-13: 10 ug/min via INTRAVENOUS

## 2017-10-13 MED ORDER — ONDANSETRON HCL 4 MG/2ML IJ SOLN
INTRAMUSCULAR | Status: AC
  Filled 2017-10-13: qty 2

## 2017-10-13 MED ORDER — KETOROLAC TROMETHAMINE 30 MG/ML IJ SOLN
INTRAMUSCULAR | Status: AC
  Administered 2017-10-13: 30 mg via INTRAVENOUS
  Filled 2017-10-13: qty 1

## 2017-10-13 MED ORDER — BUPIVACAINE LIPOSOME 1.3 % IJ SUSP
INTRAMUSCULAR | Status: AC
  Filled 2017-10-13: qty 20

## 2017-10-13 MED ORDER — ONDANSETRON HCL 4 MG/2ML IJ SOLN: 4.0000 mg | Freq: Once | INTRAMUSCULAR | Status: DC | PRN

## 2017-10-13 MED ORDER — DEXAMETHASONE SODIUM PHOSPHATE 10 MG/ML IJ SOLN
INTRAMUSCULAR | Status: DC | PRN
  Administered 2017-10-13: 10 mg via INTRAVENOUS

## 2017-10-13 MED ORDER — MAGNESIUM HYDROXIDE 400 MG/5ML PO SUSP
30.0000 mL | Freq: Every day | ORAL | Status: DC | PRN
  Administered 2017-10-14: 30 mL via ORAL
  Filled 2017-10-13: qty 30

## 2017-10-13 MED ORDER — PANTOPRAZOLE SODIUM 40 MG PO TBEC
40.0000 mg | DELAYED_RELEASE_TABLET | Freq: Every day | ORAL | Status: DC
  Administered 2017-10-13 – 2017-10-14 (×2): 40 mg via ORAL
  Filled 2017-10-13 (×2): qty 1

## 2017-10-13 MED ORDER — BUPIVACAINE LIPOSOME 1.3 % IJ SUSP
INTRAMUSCULAR | Status: DC | PRN
  Administered 2017-10-13: 60 mL

## 2017-10-13 MED ORDER — ADULT MULTIVITAMIN W/MINERALS CH
1.0000 | ORAL_TABLET | Freq: Every day | ORAL | Status: DC
  Administered 2017-10-13 – 2017-10-14 (×2): 1 via ORAL
  Filled 2017-10-13 (×2): qty 1

## 2017-10-13 MED ORDER — PROPOFOL 10 MG/ML IV BOLUS
INTRAVENOUS | Status: AC
  Filled 2017-10-13: qty 20

## 2017-10-13 MED ORDER — METOCLOPRAMIDE HCL 5 MG/ML IJ SOLN: 5.0000 mg | Freq: Three times a day (TID) | INTRAMUSCULAR | Status: DC | PRN

## 2017-10-13 MED ORDER — FAMOTIDINE 20 MG PO TABS
ORAL_TABLET | ORAL | Status: AC
  Administered 2017-10-13: 20 mg via ORAL
  Filled 2017-10-13: qty 1

## 2017-10-13 MED ORDER — SODIUM CHLORIDE 0.9 % IJ SOLN
INTRAMUSCULAR | Status: AC
  Filled 2017-10-13: qty 50

## 2017-10-13 MED ORDER — TRANEXAMIC ACID 1000 MG/10ML IV SOLN
INTRAVENOUS | Status: AC | PRN
  Administered 2017-10-13: 1000 mg via TOPICAL

## 2017-10-13 MED ORDER — BUPIVACAINE HCL (PF) 0.5 % IJ SOLN
INTRAMUSCULAR | Status: DC | PRN
  Administered 2017-10-13: 3 mL

## 2017-10-13 MED ORDER — ONDANSETRON HCL 4 MG PO TABS: 4.0000 mg | ORAL_TABLET | Freq: Four times a day (QID) | ORAL | Status: DC | PRN

## 2017-10-13 MED ORDER — ACETAMINOPHEN 325 MG PO TABS: 325.0000 mg | ORAL_TABLET | Freq: Four times a day (QID) | ORAL | Status: DC | PRN

## 2017-10-13 MED ORDER — BUPIVACAINE HCL (PF) 0.5 % IJ SOLN
INTRAMUSCULAR | Status: AC
  Filled 2017-10-13: qty 10

## 2017-10-13 MED ORDER — METOCLOPRAMIDE HCL 10 MG PO TABS: 5.0000 mg | ORAL_TABLET | Freq: Three times a day (TID) | ORAL | Status: DC | PRN

## 2017-10-13 MED ORDER — TRANEXAMIC ACID 1000 MG/10ML IV SOLN
INTRAVENOUS | Status: AC
  Filled 2017-10-13: qty 10

## 2017-10-13 MED ORDER — HYDROMORPHONE HCL 1 MG/ML IJ SOLN
0.5000 mg | INTRAMUSCULAR | Status: DC | PRN
  Administered 2017-10-13: 1 mg via INTRAVENOUS
  Filled 2017-10-13: qty 1

## 2017-10-13 MED ORDER — GLYCOPYRROLATE 0.2 MG/ML IJ SOLN
INTRAMUSCULAR | Status: DC | PRN
  Administered 2017-10-13: 0.1 mg via INTRAVENOUS

## 2017-10-13 MED ORDER — EPHEDRINE SULFATE 50 MG/ML IJ SOLN
INTRAMUSCULAR | Status: AC
  Filled 2017-10-13: qty 1

## 2017-10-13 MED ORDER — DOCUSATE SODIUM 100 MG PO CAPS
100.0000 mg | ORAL_CAPSULE | Freq: Two times a day (BID) | ORAL | Status: DC
  Administered 2017-10-13 – 2017-10-14 (×3): 100 mg via ORAL
  Filled 2017-10-13 (×3): qty 1

## 2017-10-13 MED ORDER — DIPHENHYDRAMINE HCL 12.5 MG/5ML PO ELIX: 12.5000 mg | ORAL_SOLUTION | ORAL | Status: DC | PRN

## 2017-10-13 MED ORDER — CEFAZOLIN SODIUM-DEXTROSE 2-4 GM/100ML-% IV SOLN
INTRAVENOUS | Status: AC
  Filled 2017-10-13: qty 100

## 2017-10-13 MED ORDER — ENOXAPARIN SODIUM 40 MG/0.4ML ~~LOC~~ SOLN
40.0000 mg | SUBCUTANEOUS | Status: DC
  Administered 2017-10-14: 40 mg via SUBCUTANEOUS
  Filled 2017-10-13: qty 0.4

## 2017-10-13 MED ORDER — LIDOCAINE HCL (PF) 2 % IJ SOLN
INTRAMUSCULAR | Status: AC
  Filled 2017-10-13: qty 10

## 2017-10-13 MED ORDER — FLEET ENEMA 7-19 GM/118ML RE ENEM: 1.0000 | ENEMA | Freq: Once | RECTAL | Status: DC | PRN

## 2017-10-13 MED ORDER — OXYCODONE HCL 5 MG PO TABS
5.0000 mg | ORAL_TABLET | ORAL | Status: DC | PRN
  Administered 2017-10-13 – 2017-10-14 (×3): 10 mg via ORAL
  Filled 2017-10-13 (×3): qty 2

## 2017-10-13 MED ORDER — ONDANSETRON HCL 4 MG/2ML IJ SOLN: 4.0000 mg | Freq: Four times a day (QID) | INTRAMUSCULAR | Status: DC | PRN

## 2017-10-13 MED ORDER — PROPOFOL 500 MG/50ML IV EMUL
INTRAVENOUS | Status: DC | PRN
  Administered 2017-10-13: 100 ug/kg/min via INTRAVENOUS

## 2017-10-13 MED ORDER — GLYCOPYRROLATE 0.2 MG/ML IJ SOLN
INTRAMUSCULAR | Status: AC
  Filled 2017-10-13: qty 1

## 2017-10-13 MED ORDER — LIDOCAINE HCL (CARDIAC) 20 MG/ML IV SOLN
INTRAVENOUS | Status: DC | PRN
  Administered 2017-10-13: 20 mg via INTRAVENOUS

## 2017-10-13 MED ORDER — BISACODYL 10 MG RE SUPP: 10.0000 mg | Freq: Every day | RECTAL | Status: DC | PRN

## 2017-10-13 MED ORDER — FAMOTIDINE 20 MG PO TABS
20.0000 mg | ORAL_TABLET | Freq: Once | ORAL | Status: AC
  Administered 2017-10-13: 20 mg via ORAL

## 2017-10-13 MED ORDER — ACETAMINOPHEN 500 MG PO TABS
1000.0000 mg | ORAL_TABLET | Freq: Four times a day (QID) | ORAL | Status: AC
  Administered 2017-10-13 – 2017-10-14 (×4): 1000 mg via ORAL
  Filled 2017-10-13 (×4): qty 2

## 2017-10-13 MED ORDER — FENTANYL CITRATE (PF) 100 MCG/2ML IJ SOLN
INTRAMUSCULAR | Status: AC
  Filled 2017-10-13: qty 2

## 2017-10-13 MED ORDER — MIDAZOLAM HCL 2 MG/2ML IJ SOLN
INTRAMUSCULAR | Status: AC
  Filled 2017-10-13: qty 2

## 2017-10-13 MED ORDER — PROPOFOL 500 MG/50ML IV EMUL
INTRAVENOUS | Status: AC
  Filled 2017-10-13: qty 50

## 2017-10-13 MED ORDER — BUPIVACAINE-EPINEPHRINE (PF) 0.5% -1:200000 IJ SOLN
INTRAMUSCULAR | Status: AC
  Filled 2017-10-13: qty 30

## 2017-10-13 MED ORDER — FENTANYL CITRATE (PF) 100 MCG/2ML IJ SOLN: 25.0000 ug | INTRAMUSCULAR | Status: DC | PRN

## 2017-10-13 MED ORDER — PHENYLEPHRINE HCL 10 MG/ML IJ SOLN
INTRAMUSCULAR | Status: AC
  Filled 2017-10-13: qty 1

## 2017-10-13 MED ORDER — KCL IN DEXTROSE-NACL 20-5-0.9 MEQ/L-%-% IV SOLN
INTRAVENOUS | Status: DC
  Administered 2017-10-13: 14:00:00 via INTRAVENOUS
  Filled 2017-10-13 (×4): qty 1000

## 2017-10-13 MED ORDER — KETOROLAC TROMETHAMINE 15 MG/ML IJ SOLN
15.0000 mg | Freq: Four times a day (QID) | INTRAMUSCULAR | Status: AC
  Administered 2017-10-13 – 2017-10-14 (×4): 15 mg via INTRAVENOUS
  Filled 2017-10-13 (×4): qty 1

## 2017-10-13 MED ORDER — CEFAZOLIN SODIUM-DEXTROSE 2-4 GM/100ML-% IV SOLN
2.0000 g | Freq: Four times a day (QID) | INTRAVENOUS | Status: AC
  Administered 2017-10-13 – 2017-10-14 (×3): 2 g via INTRAVENOUS
  Filled 2017-10-13 (×4): qty 100

## 2017-10-13 MED ORDER — ACETAMINOPHEN 10 MG/ML IV SOLN
INTRAVENOUS | Status: DC | PRN
  Administered 2017-10-13: 1000 mg via INTRAVENOUS

## 2017-10-13 MED ORDER — DEXAMETHASONE SODIUM PHOSPHATE 10 MG/ML IJ SOLN
INTRAMUSCULAR | Status: AC
  Filled 2017-10-13: qty 1

## 2017-10-13 MED ORDER — NEOMYCIN-POLYMYXIN B GU 40-200000 IR SOLN
Status: AC
  Filled 2017-10-13: qty 20

## 2017-10-13 MED ORDER — ACETAMINOPHEN 10 MG/ML IV SOLN
INTRAVENOUS | Status: AC
  Filled 2017-10-13: qty 100

## 2017-10-13 SURGICAL SUPPLY — 63 items
BANDAGE ACE 6X5 VEL STRL LF (GAUZE/BANDAGES/DRESSINGS) ×2 IMPLANT
BEARING MENISCAL TIBIAL 4 MD R (Orthopedic Implant) ×2 IMPLANT
BRNG TIB MED 4 PHS 3 RT MEN (Orthopedic Implant) ×1 IMPLANT
CANISTER SUCT 1200ML W/VALVE (MISCELLANEOUS) ×2 IMPLANT
CANISTER SUCT 3000ML PPV (MISCELLANEOUS) ×2 IMPLANT
CEMENT BONE R 1X40 (Cement) ×2 IMPLANT
CEMENT VACUUM MIXING SYSTEM (MISCELLANEOUS) ×2 IMPLANT
CHLORAPREP W/TINT 26ML (MISCELLANEOUS) ×4 IMPLANT
COOLER POLAR GLACIER W/PUMP (MISCELLANEOUS) ×2 IMPLANT
COVER MAYO STAND STRL (DRAPES) ×2 IMPLANT
CUFF TOURN 24 STER (MISCELLANEOUS) ×2 IMPLANT
CUFF TOURN 30 STER DUAL PORT (MISCELLANEOUS) IMPLANT
DECANTER SPIKE VIAL GLASS SM (MISCELLANEOUS) ×6 IMPLANT
DRAPE C-ARM XRAY 36X54 (DRAPES) IMPLANT
DRSG OPSITE POSTOP 4X12 (GAUZE/BANDAGES/DRESSINGS) ×2 IMPLANT
DRSG OPSITE POSTOP 4X14 (GAUZE/BANDAGES/DRESSINGS) ×2 IMPLANT
DRSG OPSITE POSTOP 4X6 (GAUZE/BANDAGES/DRESSINGS) ×2 IMPLANT
ELECT CAUTERY BLADE 6.4 (BLADE) ×2 IMPLANT
ELECT REM PT RETURN 9FT ADLT (ELECTROSURGICAL) ×2
ELECTRODE REM PT RTRN 9FT ADLT (ELECTROSURGICAL) ×1 IMPLANT
GAUZE PETRO XEROFOAM 1X8 (MISCELLANEOUS) ×2 IMPLANT
GAUZE SPONGE 4X4 12PLY STRL (GAUZE/BANDAGES/DRESSINGS) ×2 IMPLANT
GLOVE BIO SURGEON STRL SZ7.5 (GLOVE) ×8 IMPLANT
GLOVE BIO SURGEON STRL SZ8 (GLOVE) ×8 IMPLANT
GLOVE BIOGEL PI IND STRL 8 (GLOVE) ×1 IMPLANT
GLOVE BIOGEL PI INDICATOR 8 (GLOVE) ×1
GLOVE INDICATOR 8.0 STRL GRN (GLOVE) ×2 IMPLANT
GOWN STRL REUS W/ TWL LRG LVL3 (GOWN DISPOSABLE) ×1 IMPLANT
GOWN STRL REUS W/ TWL XL LVL3 (GOWN DISPOSABLE) ×1 IMPLANT
GOWN STRL REUS W/TWL LRG LVL3 (GOWN DISPOSABLE) ×1
GOWN STRL REUS W/TWL XL LVL3 (GOWN DISPOSABLE) ×2
HOLDER FOLEY CATH W/STRAP (MISCELLANEOUS) ×2 IMPLANT
HOOD PEEL AWAY FLYTE STAYCOOL (MISCELLANEOUS) ×6 IMPLANT
KIT TURNOVER KIT A (KITS) ×2 IMPLANT
MAT BLUE FLOOR 46X72 FLO (MISCELLANEOUS) ×2 IMPLANT
NDL SAFETY ECLIPSE 18X1.5 (NEEDLE) ×1 IMPLANT
NEEDLE HYPO 18GX1.5 SHARP (NEEDLE) ×2
NEEDLE SPNL 20GX3.5 QUINCKE YW (NEEDLE) ×2 IMPLANT
NS IRRIG 1000ML POUR BTL (IV SOLUTION) ×2 IMPLANT
PACK BLADE SAW RECIP 70 3 PT (BLADE) ×2 IMPLANT
PACK TOTAL KNEE (MISCELLANEOUS) ×2 IMPLANT
PAD WRAPON POLAR KNEE (MISCELLANEOUS) ×1 IMPLANT
PEG TWIN FEM CEMENTED MED (Knees) ×2 IMPLANT
PULSAVAC PLUS IRRIG FAN TIP (DISPOSABLE) ×2
SOL .9 NS 3000ML IRR  AL (IV SOLUTION) ×1
SOL .9 NS 3000ML IRR AL (IV SOLUTION) ×1
SOL .9 NS 3000ML IRR UROMATIC (IV SOLUTION) ×1 IMPLANT
SPONGE XRAY 4X4 16PLY STRL (MISCELLANEOUS) ×2 IMPLANT
STAPLER SKIN PROX 35W (STAPLE) ×2 IMPLANT
STRAP SAFETY 5IN WIDE (MISCELLANEOUS) ×2 IMPLANT
SUCTION FRAZIER HANDLE 10FR (MISCELLANEOUS) ×1
SUCTION TUBE FRAZIER 10FR DISP (MISCELLANEOUS) ×1 IMPLANT
SUT VIC AB 0 CT1 36 (SUTURE) ×2 IMPLANT
SUT VIC AB 2-0 CT1 27 (SUTURE) ×8
SUT VIC AB 2-0 CT1 TAPERPNT 27 (SUTURE) ×4 IMPLANT
SYR 10ML LL (SYRINGE) ×2 IMPLANT
SYR 20CC LL (SYRINGE) ×2 IMPLANT
SYR 30ML LL (SYRINGE) ×6 IMPLANT
TAPE TRANSPORE STRL 2 31045 (GAUZE/BANDAGES/DRESSINGS) ×2 IMPLANT
TIP FAN IRRIG PULSAVAC PLUS (DISPOSABLE) ×1 IMPLANT
TRAY FOLEY W/METER SILVER 16FR (SET/KITS/TRAYS/PACK) ×2 IMPLANT
TRAY TIBIAL OXFORD SZ C RT (Joint) ×2 IMPLANT
WRAPON POLAR PAD KNEE (MISCELLANEOUS) ×2

## 2017-10-13 NOTE — Anesthesia Preprocedure Evaluation (Signed)
Anesthesia Evaluation  Patient identified by MRN, date of birth, ID band Patient awake    Reviewed: Allergy & Precautions, NPO status , Patient's Chart, lab work & pertinent test results  History of Anesthesia Complications Negative for: history of anesthetic complications  Airway Mallampati: III       Dental   Pulmonary neg sleep apnea, neg COPD,           Cardiovascular (-) hypertension(-) Past MI and (-) CHF (-) dysrhythmias + Valvular Problems/Murmurs (murmur, no tx)      Neuro/Psych neg Seizures    GI/Hepatic Neg liver ROS, neg GERD  ,  Endo/Other  neg diabetes  Renal/GU negative Renal ROS     Musculoskeletal   Abdominal   Peds  Hematology   Anesthesia Other Findings   Reproductive/Obstetrics                             Anesthesia Physical Anesthesia Plan  ASA: I  Anesthesia Plan: Spinal   Post-op Pain Management:    Induction:   PONV Risk Score and Plan:   Airway Management Planned:   Additional Equipment:   Intra-op Plan:   Post-operative Plan:   Informed Consent: I have reviewed the patients History and Physical, chart, labs and discussed the procedure including the risks, benefits and alternatives for the proposed anesthesia with the patient or authorized representative who has indicated his/her understanding and acceptance.     Plan Discussed with:   Anesthesia Plan Comments:         Anesthesia Quick Evaluation

## 2017-10-13 NOTE — Evaluation (Signed)
Physical Therapy Evaluation Patient Details Name: Cody Pearson MRN: [id number] DOB: [birth year] Today's Date: 10/13/2017   History of Present Illness  Pt is a 67 y/o M s/p R unicondylar knee arthroplasty.  Pt's PMH includes cancer.  Clinical Impression  Pt is s/p above surgery resulting in the deficits listed below (see PT Problem List). Cody Pearson was independent PTA working full-time.  He currently requires min guard for sit<>stand transfers with cues for safe and proper technique.  Pt requires min on several occasions when ambulating due to unsteadiness likely related to his medication. Pt ambulated 120 ft with RW. Pt will benefit from skilled PT to increase their independence and safety with mobility to allow discharge to the venue listed below.     Follow Up Recommendations Outpatient PT    Equipment Recommendations  None recommended by PT    Recommendations for Other Services       Precautions / Restrictions Precautions Precautions: Fall Restrictions Weight Bearing Restrictions: Yes RLE Weight Bearing: Weight bearing as tolerated      Mobility  Bed Mobility Overal bed mobility: Modified Independent             General bed mobility comments: No physical assist or cues needed.  Slightly increased time.   Transfers Overall transfer level: Needs assistance Equipment used: Rolling walker (2 wheeled) Transfers: Sit to/from Stand Sit to Stand: Min guard         General transfer comment: Cues for proper hand placement and safe technique to stand.  Pt with proper technique to sit.   Ambulation/Gait Ambulation/Gait assistance: Min assist;Min guard Ambulation Distance (Feet): 120 Feet Assistive device: Rolling walker (2 wheeled) Gait Pattern/deviations: Step-to pattern;Step-through pattern;Decreased stance time - right;Decreased step length - left;Decreased weight shift to right;Antalgic;Staggering left;Staggering right Gait velocity: decreased Gait velocity  interpretation: Below normal speed for age/gender General Gait Details: Pt with occasional unsteadiness stumbling L/R x2 requiring min assist to steady (pt saying this is due to his wooziness from medication).  Otherwise min guard assist while ambulating.  Pt initially ambulating with step to gait pattern but cues provided for step through with improved gait mechanics and stability.   Stairs            Wheelchair Mobility    Modified Rankin (Stroke Patients Only)       Balance Overall balance assessment: Needs assistance Sitting-balance support: No upper extremity supported;Feet supported Sitting balance-Leahy Scale: Good     Standing balance support: No upper extremity supported;During functional activity Standing balance-Leahy Scale: Fair Standing balance comment: Pt able to stand statically without UE support but relies on UE support for dynamic activities                             Pertinent Vitals/Pain Pain Assessment: No/denies pain    Home Living Family/patient expects to be discharged to:: Private residence Living Arrangements: Alone Available Help at Discharge: Friend(s);Available PRN/intermittently Type of Home: House Home Access: Stairs to enter Entrance Stairs-Rails: None Entrance Stairs-Number of Steps: 2 Home Layout: One level Home Equipment: Walker - 2 wheels;Grab bars - tub/shower      Prior Function Level of Independence: Independent         Comments: Pt working full-time as a Architectural technologist at [employer].  Pt ambulates without AD. No falls in the past 6 months.      Hand Dominance        Extremity/Trunk Assessment   Upper  Extremity Assessment Upper Extremity Assessment: Overall WFL for tasks assessed    Lower Extremity Assessment Lower Extremity Assessment: RLE deficits/detail RLE Deficits / Details: Pt able to perform SLR without assist x10.  Unable to formally assess strength.        Communication    Communication: No difficulties  Cognition Arousal/Alertness: Awake/alert Behavior During Therapy: WFL for tasks assessed/performed Overall Cognitive Status: Within Functional Limits for tasks assessed                                        General Comments      Exercises Total Joint Exercises Ankle Circles/Pumps: AROM;Both;10 reps;Supine Quad Sets: Strengthening;Both;10 reps;Supine Straight Leg Raises: AROM;Strengthening;Right;10 reps;Supine Knee Flexion: AAROM;Right;10 reps;Other (comment);Seated(with 5 second holds) Goniometric ROM: -3 to 109 deg   Assessment/Plan    PT Assessment Patient needs continued PT services  PT Problem List Decreased strength;Decreased range of motion;Decreased activity tolerance;Decreased balance;Decreased knowledge of use of DME;Decreased safety awareness;Decreased knowledge of precautions;Pain       PT Treatment Interventions DME instruction;Gait training;Stair training;Functional mobility training;Therapeutic activities;Therapeutic exercise;Balance training;Neuromuscular re-education;Patient/family education;Modalities    PT Goals (Current goals can be found in the Care Plan section)  Acute Rehab PT Goals Patient Stated Goal: to return to PLOF PT Goal Formulation: With patient Time For Goal Achievement: 10/27/17 Potential to Achieve Goals: Good    Frequency BID   Barriers to discharge        Co-evaluation               AM-PAC PT "6 Clicks" Daily Activity  Outcome Measure Difficulty turning over in bed (including adjusting bedclothes, sheets and blankets)?: A Lot Difficulty moving from lying on back to sitting on the side of the bed? : A Lot Difficulty sitting down on and standing up from a chair with arms (e.g., wheelchair, bedside commode, etc,.)?: A Lot Help needed moving to and from a bed to chair (including a wheelchair)?: A Little Help needed walking in hospital room?: A Little Help needed climbing 3-5 steps  with a railing? : A Little 6 Click Score: 15    End of Session Equipment Utilized During Treatment: Gait belt Activity Tolerance: Patient tolerated treatment well Patient left: in chair;with call bell/phone within reach;with chair alarm set;Other (comment)(with bone foam and polar care) Nurse Communication: Mobility status PT Visit Diagnosis: Pain;Unsteadiness on feet (R26.81);Other abnormalities of gait and mobility (R26.89);Muscle weakness (generalized) (M62.81);Difficulty in walking, not elsewhere classified (R26.2) Pain - Right/Left: Right Pain - part of body: Knee    Time: 1451-1521 PT Time Calculation (min) (ACUTE ONLY): 30 min   Charges:   PT Evaluation $PT Eval Low Complexity: 1 Low PT Treatments $Gait Training: 8-22 mins   PT G Codes:        Collie Siad PT, DPT 10/13/2017, 3:34 PM

## 2017-10-13 NOTE — H&P (Signed)
Paper H&P to be scanned into permanent record. H&P reviewed and patient re-examined. No changes. 

## 2017-10-13 NOTE — Op Note (Signed)
10/13/2017  10:12 AM  Patient:   Cody Pearson  Pre-Op Diagnosis:   Osteoarthritis of medial compartment, right knee.  Post-Op Diagnosis:   Same  Procedure:   Right unicondylar knee arthroplasty.  Surgeon:   Pascal Lux, MD  Assistant:   Cameron Proud, PA-C  Anesthesia:   Spinal  Findings:   As above.  Complications:   None  EBL:   10 cc  Fluids:   1500 cc crystalloid  UOP:   100 cc  TT:   90 minutes at 300 mmHg  Drains:   None  Closure:   Staples  Implants:   All-cemented Biomet Oxford system with a medium femoral component, a "C" sized tibial tray, and a 4 mm meniscal bearing insert.  Brief Clinical Note:   The patient is a 67 year old male with a long history of bilateral knee pain, right more symptomatic than left. His symptoms have persisted despite medications, activity modification, etc. His history and examination consistent with advanced degenerative joint disease, primarily involving the medial compartments of both knees. The patient presents at this time for a right partial knee replacement.  Procedure:   The patient was brought into the operating room and a spinal placed by the anesthesiologist. The patient was lain in the supine position and a Foley catheter inserted. The patient was repositioned so that the non-surgical leg was placed in a flexed and abducted position in the yellow fin leg holder while the surgical extremity was placed over the Biomet leg holder. The right lower extremity was prepped with ChloraPrep solution before being draped sterilely. Preoperative antibiotics were administered. After performing a timeout to verify the appropriate surgical site, the limb was exsanguinated with an Esmarch and the tourniquet inflated to 300 mmHg. A standard anterior approach to the knee was made through an approximately 3.5-4 inch incision. The incision was carried down through the subcutaneous tissues to expose the superficial retinaculum. This was split the  length the incision and medial flap elevated sufficiently to expose the medial retinaculum. This was incised along the medial border of the patella tendon and extended proximally along the medial border of the patella, leaving a 3-4 mm cuff of tissue. The soft tissues were elevated off the anteromedial aspect of the proximal tibia. The anterior portion of the meniscus was removed after performing a subtotal excision of the infrapatellar fat pad. The anterior cruciate ligament was inspected and found to be in excellent condition. Osteophytes were removed from the inferior pole of the patella as well as from the notch using a quarter-inch osteotome. There were significant degenerative changes of both the femur and tibia on the medial side. The medial femoral condyle was sized using the large and medium sizers. It was felt that the medium guide best optimized the contour of the femur. This was left in place and the external tibial guide positioned. The coupling device was used to connect the guide to the medial femoral condylar sizer to optimize appropriate orientation. Two guide pins were inserted into the cutting block before the coupling device and sizer were removed. The appropriate tibial cut was made using the oscillating and reciprocating saws. The piece was removed in its entirety and taken to the back table where it was sized and found to be optimally replicated by a "C" sized component. The 9 mm spacer was inserted to verify that sufficient bone had been removed.  Attention was directed to femoral side. The intramedullary canal was accessed through a 4 mm drill hole.  The intramedullary guide was positioned before the guide for the femoral condylar holes was positioned. The appropriate coupling device connected this guide to the intramedullary guide before both drill holes were created in the distal aspect of the medial femoral condyle. The devices were removed and the posterior condylar cutting block  inserted. The appropriate cut was made using the reciprocating saw and this piece removed. The #0 spigot was inserted and the initial bone milling performed. A trial femoral component was inserted and both the flexion and extension gaps measured. In flexion, the gap measured 7 mm whereas in extension, it measured 5 mm. Therefore, the #2 spigot was selected and the secondary bone milling performed. Repeat sizing demonstrated symmetric flexion and extension gaps. The bone was removed from the postero-medial and postero-lateral aspects of the femoral condyle, as well as from the beneath the collar of the spigot. Bone also was removed from the anterior portion of the femur so as to minimize any potential impingement with the meniscal bearing insert. The trial components removed and several drill holes placed into the distal femoral condyle to further augment cement fixation.  Attention was redirected to the tibial side. The "C" sized tibial tray was positioned and temporarily secured using the appropriate spiked nail. The keel was created using the bi-bladed reciprocating saw and hoe. The keeled "C" sized trial tibial tray was inserted to be sure that it seated properly. At this point, a total of 20 cc of Exparel diluted out to 60 cc with normal saline and 30 cc of 0.5% Sensorcaine was injected in and around the posterior and medial capsular tissues, as well as the peri-incisional tissues to help with postoperative pain control.  The bony surfaces were prepared for cementing by irrigating them thoroughly with bacitracin saline solution using the jet lavage system before packing them with a dry Ray-Tec sponge. Meanwhile, cement was being mixed on the back table. When the cement was ready, the tibial tray was cemented in first. The excess cement was removed using a Surveyor, quantity after impacting it into place. Next, the femoral component was impacted into place. Again the excess cement was removed using a Engineering geologist. The 4 mm spacer was inserted and the knee brought into near full extension while the cement hardened. Once the cement hardened, the spacer was removed and the 4 mm meniscal bearing insert was trialed. This demonstrated excellent tracking while the knee was placed through a range of motion, and showed no evidence towards subluxation or dislocation. In addition, it did not fit too tightly. Therefore, the permanent 4 mm meniscal bearing insert was snapped into position after verifying that no cement had been retained posteriorly. Again the knee was placed through a range of motion with the findings as described above.  The wound was copiously irrigated with bacitracin saline solution via the jet lavage system before the retinacular layer was reapproximated using #0 Vicryl interrupted sutures. At this point, 1 g of transexemic acid in 10 cc of normal saline was injected intra-articularly. The subcutaneous tissues were closed in two layers using 2-0 Vicryl interrupted sutures before the skin was closed using staples. A sterile occlusive dressing was applied to the knee before the patient was awakened. The patient was transferred back to his/her hospital bed and returned to the recovery room in satisfactory condition after tolerating the procedure well. A Polar Care device was applied to the knee as well.

## 2017-10-13 NOTE — Anesthesia Post-op Follow-up Note (Signed)
Anesthesia QCDR form completed.        

## 2017-10-13 NOTE — Anesthesia Procedure Notes (Signed)
Spinal  Patient location during procedure: OR Start time: 10/13/2017 7:35 AM End time: 10/13/2017 7:40 AM Reason for block: procedure for pain Staffing Anesthesiologist: Gunnar Fusi, MD Resident/CRNA: Nile Riggs, CRNA Performed: resident/CRNA  Preanesthetic Checklist Completed: patient identified, site marked, surgical consent, pre-op evaluation, timeout performed, IV checked, risks and benefits discussed and monitors and equipment checked Spinal Block Patient position: sitting Prep: ChloraPrep Patient monitoring: heart rate, continuous pulse ox and blood pressure Approach: midline Location: L3-4 Injection technique: single-shot Needle Needle type: Pencil-Tip  Needle gauge: 25 G Assessment Sensory level: T4

## 2017-10-13 NOTE — Transfer of Care (Signed)
Immediate Anesthesia Transfer of Care Note  Patient: Cody Pearson  Procedure(s) Performed: UNICOMPARTMENTAL KNEE (Right Knee)  Patient Location: PACU  Anesthesia Type:Spinal  Level of Consciousness: awake, alert  and patient cooperative  Airway & Oxygen Therapy: Patient Spontanous Breathing and Patient connected to nasal cannula oxygen  Post-op Assessment: Report given to RN and Post -op Vital signs reviewed and stable  Post vital signs: Reviewed and stable  Last Vitals:  Vitals:   10/13/17 0612  BP: (!) 128/95  Pulse: 82  Resp: 17  Temp: (!) 36.1 C  SpO2: 97%    Last Pain:  Vitals:   10/13/17 0612  TempSrc: Temporal  PainSc: 2          Complications: No apparent anesthesia complications

## 2017-10-13 NOTE — NC FL2 (Signed)
[place] LEVEL OF CARE SCREENING TOOL     IDENTIFICATION  Patient Name: Cody Pearson Birthdate: [birth year] Sex: male Admission Date (Current Location): 10/13/2017  [place] and [place] Number:  Engineering geologist and Address:  [facility], [address], [place], [place] [address]      Provider Number: [id number]  Attending Physician Name and Address:  Corky Mull, MD  Relative Name and Phone Number:       Current Level of Care: Hospital Recommended Level of Care: [place] Prior Approval Number:    Date Approved/Denied:   PASRR Number: [id number])  Discharge Plan: SNF    Current Diagnoses: Patient Active Problem List   Diagnosis Date Noted  . Status post right partial knee replacement 10/13/2017  . Contact dermatitis due to poison ivy 05/11/2017  . Borderline diabetes 10/15/2015  . Preventative health care 12/20/2014  . Blood in stool 06/17/2013    Orientation RESPIRATION BLADDER Height & Weight     Self, Time, Situation, Place  Normal Continent Weight: 185 lb (83.9 kg) Height:  5\' 9"  (175.3 cm)  BEHAVIORAL SYMPTOMS/MOOD NEUROLOGICAL BOWEL NUTRITION STATUS      Continent Diet(Diet: Carb Modified. )  AMBULATORY STATUS COMMUNICATION OF NEEDS Skin   Extensive Assist Verbally Surgical wounds(Incision: Right Knee. )                       Personal Care Assistance Level of Assistance  Bathing, Feeding, Dressing Bathing Assistance: Limited assistance Feeding assistance: Independent Dressing Assistance: Limited assistance     Functional Limitations Info  Sight, Hearing, Speech Sight Info: Adequate Hearing Info: Adequate Speech Info: Adequate    SPECIAL CARE FACTORS FREQUENCY  PT (By licensed PT), OT (By licensed OT)     PT Frequency: (5)              Contractures      Additional Factors Info  Code Status, Allergies Code Status Info: (Full Code. ) Allergies Info: (No Known  Allergies )           Current Medications (10/13/2017):  This is the current hospital active medication list Current Facility-Administered Medications  Medication Dose Route Frequency Provider Last Rate Last Dose  . acetaminophen (TYLENOL) tablet 1,000 mg  1,000 mg Oral Q6H Poggi, Marshall Cork, MD   1,000 mg at 10/13/17 1320  . [START ON 10/14/2017] acetaminophen (TYLENOL) tablet 325-650 mg  325-650 mg Oral Q6H PRN Poggi, Marshall Cork, MD      . bisacodyl (DULCOLAX) suppository 10 mg  10 mg Rectal Daily PRN Poggi, Marshall Cork, MD      . ceFAZolin (ANCEF) IVPB 2g/100 mL premix  2 g Intravenous Q6H Poggi, Marshall Cork, MD   Stopped at 10/13/17 1438  . dextrose 5 % and 0.9 % NaCl with KCl 20 mEq/L infusion   Intravenous Continuous Poggi, Marshall Cork, MD 75 mL/hr at 10/13/17 1405    . diphenhydrAMINE (BENADRYL) 12.5 MG/5ML elixir 12.5-25 mg  12.5-25 mg Oral Q4H PRN Poggi, Marshall Cork, MD      . docusate sodium (COLACE) capsule 100 mg  100 mg Oral BID Poggi, Marshall Cork, MD   100 mg at 10/13/17 1517  . [START ON 10/14/2017] enoxaparin (LOVENOX) injection 40 mg  40 mg Subcutaneous Q24H Poggi, Marshall Cork, MD      . HYDROmorphone (DILAUDID) injection 0.5-1 mg  0.5-1 mg Intravenous Q4H PRN Poggi, Marshall Cork, MD      .  ketorolac (TORADOL) 15 MG/ML injection 15 mg  15 mg Intravenous Q6H Poggi, Marshall Cork, MD   15 mg at 10/13/17 1400  . magnesium hydroxide (MILK OF MAGNESIA) suspension 30 mL  30 mL Oral Daily PRN Poggi, Marshall Cork, MD      . metoCLOPramide (REGLAN) tablet 5-10 mg  5-10 mg Oral Q8H PRN Poggi, Marshall Cork, MD       Or  . metoCLOPramide (REGLAN) injection 5-10 mg  5-10 mg Intravenous Q8H PRN Poggi, Marshall Cork, MD      . multivitamin with minerals tablet 1 tablet  1 tablet Oral Daily Poggi, Marshall Cork, MD   1 tablet at 10/13/17 1517  . ondansetron (ZOFRAN) tablet 4 mg  4 mg Oral Q6H PRN Poggi, Marshall Cork, MD       Or  . ondansetron (ZOFRAN) injection 4 mg  4 mg Intravenous Q6H PRN Poggi, Marshall Cork, MD      . oxyCODONE (Oxy IR/ROXICODONE) immediate release tablet 5-10  mg  5-10 mg Oral Q4H PRN Poggi, Marshall Cork, MD   10 mg at 10/13/17 1553  . pantoprazole (PROTONIX) EC tablet 40 mg  40 mg Oral Daily Poggi, Marshall Cork, MD   40 mg at 10/13/17 1517  . sodium phosphate (FLEET) 7-19 GM/118ML enema 1 enema  1 enema Rectal Once PRN Poggi, Marshall Cork, MD         Discharge Medications: Please see discharge summary for a list of discharge medications.  Relevant Imaging Results:  Relevant Lab Results:   Additional Information (SSN: [id number])  Tynell Winchell, Veronia Beets, LCSW

## 2017-10-13 NOTE — Anesthesia Procedure Notes (Signed)
Performed by: Nile Riggs, CRNA Ventilation: Nasal airway inserted- appropriate to patient size

## 2017-10-14 ENCOUNTER — Emergency Department
Admission: EM | Admit: 2017-10-14 | Discharge: 2017-10-14 | Disposition: A | Discharge status: Home / Self Care | Payer: Commercial Managed Care - PPO | Attending: Emergency Medicine | Admitting: Emergency Medicine

## 2017-10-14 ENCOUNTER — Encounter: Payer: Self-pay | Admitting: Emergency Medicine

## 2017-10-14 ENCOUNTER — Other Ambulatory Visit: Payer: Self-pay

## 2017-10-14 DIAGNOSIS — Z96651 Presence of right artificial knee joint: Secondary | ICD-10-CM | POA: Insufficient documentation

## 2017-10-14 DIAGNOSIS — Z711 Person with feared health complaint in whom no diagnosis is made: Secondary | ICD-10-CM | POA: Insufficient documentation

## 2017-10-14 LAB — CBC WITH DIFFERENTIAL/PLATELET
BASOS ABS: 0 10*3/uL (ref 0–0.1)
BASOS PCT: 0 %
EOS ABS: 0 10*3/uL (ref 0–0.7)
Eosinophils Relative: 0 %
HEMATOCRIT: 38.8 % — AB (ref 40.0–52.0)
HEMOGLOBIN: 13.2 g/dL (ref 13.0–18.0)
Lymphocytes Relative: 4 %
Lymphs Abs: 0.6 10*3/uL — ABNORMAL LOW (ref 1.0–3.6)
MCH: 31.1 pg (ref 26.0–34.0)
MCHC: 34 g/dL (ref 32.0–36.0)
MCV: 91.5 fL (ref 80.0–100.0)
Monocytes Absolute: 1.1 10*3/uL — ABNORMAL HIGH (ref 0.2–1.0)
Monocytes Relative: 7 %
NEUTROS ABS: 13.2 10*3/uL — AB (ref 1.4–6.5)
NEUTROS PCT: 89 %
Platelets: 202 10*3/uL (ref 150–440)
RBC: 4.24 MIL/uL — ABNORMAL LOW (ref 4.40–5.90)
RDW: 13.1 % (ref 11.5–14.5)
WBC: 14.8 10*3/uL — ABNORMAL HIGH (ref 3.8–10.6)

## 2017-10-14 LAB — BASIC METABOLIC PANEL
ANION GAP: 5 (ref 5–15)
BUN: 16 mg/dL (ref 6–20)
CALCIUM: 8.3 mg/dL — AB (ref 8.9–10.3)
CHLORIDE: 106 mmol/L (ref 101–111)
CO2: 25 mmol/L (ref 22–32)
CREATININE: 1.06 mg/dL (ref 0.61–1.24)
GFR calc Af Amer: >60 mL/min (ref >60)
GFR calc non Af Amer: >60 mL/min (ref >60)
Glucose, Bld: 160 mg/dL — ABNORMAL HIGH (ref 65–99)
Potassium: 4.3 mmol/L (ref 3.5–5.1)
SODIUM: 136 mmol/L (ref 135–145)

## 2017-10-14 MED ORDER — ENOXAPARIN SODIUM 40 MG/0.4ML ~~LOC~~ SOLN: 40.0000 mg | SUBCUTANEOUS | 0 refills | Status: DC

## 2017-10-14 MED ORDER — OXYCODONE HCL 5 MG PO TABS: 5.0000 mg | ORAL_TABLET | ORAL | 0 refills | Status: DC | PRN

## 2017-10-14 NOTE — ED Triage Notes (Addendum)
Patient ambulatory to triage with steady gait, without difficulty or distress noted; pt reports right knee surgery yesterday; pt reports bleeding to site and concerned stitches have come out; right knee dressing saturated; dressing removed; incision with edges well approximated, no redness/swelling or bleeding noted; staples are intact; sterile dressing reapplied; pt noted to have very tight fitting jeans on

## 2017-10-14 NOTE — Progress Notes (Signed)
Physical Therapy Treatment Patient Details Name: Cody Pearson MRN: [id number] DOB: [birth year] Today's Date: 10/14/2017    History of Present Illness Pt is a 67 y/o M s/p R unicondylar knee arthroplasty.  Pt's PMH includes cancer.    PT Comments    [name] made excellent progress with mobility.  He ambulated 280 ft with supervision for safety and completed stair training this date.  R knee ROM is 0-109 this date.  Pt is ready for d/c from a mobility standpoint.  Follow up recommendations remain appropriate at this time.    Follow Up Recommendations  Outpatient PT     Equipment Recommendations  None recommended by PT    Recommendations for Other Services       Precautions / Restrictions Precautions Precautions: Fall Restrictions Weight Bearing Restrictions: Yes RLE Weight Bearing: Weight bearing as tolerated    Mobility  Bed Mobility Overal bed mobility: Modified Independent             General bed mobility comments: No physical assist or cues needed.  Slightly increased time.   Transfers Overall transfer level: Needs assistance Equipment used: Rolling walker (2 wheeled) Transfers: Sit to/from Stand Sit to Stand: Supervision         General transfer comment: Cues for proper hand placement with sit>stand. Pt performs proper technique for stand>sit.    Ambulation/Gait Ambulation/Gait assistance: Supervision Ambulation Distance (Feet): 280 Feet Assistive device: Rolling walker (2 wheeled) Gait Pattern/deviations: Step-through pattern;Decreased stance time - right;Decreased step length - left;Decreased weight shift to right;Antalgic;Staggering left;Staggering right Gait velocity: slightly decreased Gait velocity interpretation: Below normal speed for age/gender General Gait Details: Pt demonstrates step through gait pattern.  No unsteadiness appreciated.  Cues for forward gaze and upright posture.     Stairs Stairs: Yes   Stair Management: No  rails;Step to pattern;Forwards Number of Stairs: 4 General stair comments: 1 person HHA as pt will have assist to ascend/descend steps from friend at d/c.  Pt able to recall proper sequencing without cues.   Wheelchair Mobility    Modified Rankin (Stroke Patients Only)       Balance Overall balance assessment: Needs assistance Sitting-balance support: No upper extremity supported;Feet supported Sitting balance-Leahy Scale: Good     Standing balance support: No upper extremity supported;During functional activity Standing balance-Leahy Scale: Fair Standing balance comment: Pt able to stand statically without UE support but relies on UE support for dynamic activities                            Cognition Arousal/Alertness: Awake/alert Behavior During Therapy: WFL for tasks assessed/performed Overall Cognitive Status: Within Functional Limits for tasks assessed                                        Exercises Total Joint Exercises Ankle Circles/Pumps: AROM;Both;10 reps;Supine Quad Sets: Strengthening;Both;10 reps;Seated Long Arc Quad: Strengthening;AROM;Right;10 reps;Seated Knee Flexion: AAROM;Right;10 reps;Other (comment);Seated(with 5 second holds) Goniometric ROM: 0-109    General Comments        Pertinent Vitals/Pain Pain Assessment: 0-10 Pain Score: 1  Pain Location: R knee Pain Descriptors / Indicators: Aching;Discomfort Pain Intervention(s): Limited activity within patient's tolerance;Monitored during session;Repositioned    Home Living                      Prior Function  PT Goals (current goals can now be found in the care plan section) Acute Rehab PT Goals Patient Stated Goal: to return to PLOF PT Goal Formulation: With patient Time For Goal Achievement: 10/27/17 Potential to Achieve Goals: Good Progress towards PT goals: Progressing toward goals    Frequency    BID      PT Plan Current plan  remains appropriate    Co-evaluation              AM-PAC PT "6 Clicks" Daily Activity  Outcome Measure  Difficulty turning over in bed (including adjusting bedclothes, sheets and blankets)?: None Difficulty moving from lying on back to sitting on the side of the bed? : A Little Difficulty sitting down on and standing up from a chair with arms (e.g., wheelchair, bedside commode, etc,.)?: A Little Help needed moving to and from a bed to chair (including a wheelchair)?: A Little Help needed walking in hospital room?: A Little Help needed climbing 3-5 steps with a railing? : A Little 6 Click Score: 19    End of Session Equipment Utilized During Treatment: Gait belt Activity Tolerance: Patient tolerated treatment well Patient left: in chair;with call bell/phone within reach;with chair alarm set;Other (comment)(with bone foam and polar care) Nurse Communication: Mobility status PT Visit Diagnosis: Pain;Unsteadiness on feet (R26.81);Other abnormalities of gait and mobility (R26.89);Muscle weakness (generalized) (M62.81);Difficulty in walking, not elsewhere classified (R26.2) Pain - Right/Left: Right Pain - part of body: Knee     Time: 2536-6440 PT Time Calculation (min) (ACUTE ONLY): 22 min  Charges:  $Gait Training: 8-22 mins                    G Codes:       Collie Siad PT, DPT 10/14/2017, 10:38 AM

## 2017-10-14 NOTE — Discharge Instructions (Signed)

## 2017-10-14 NOTE — ED Provider Notes (Signed)
[facility] Emergency Department Provider Note  ____________________________________________  Time seen: Approximately 10:13 PM  I have reviewed the triage vital signs and the nursing notes.   HISTORY  Chief Complaint Post-op Problem    HPI Cody Pearson is a 67 y.o. male presents to the emergency department for a wound check to right knee.  Patient reports that he had a unicompartmental arthroplasty 1 day ago and has noticed increased  bleeding from surgical site.  Patient received Lovenox for DVT prophylaxis.  Past Medical History:  Diagnosis Date  . Arthritis   . Cancer ([place])    melanoma  . History of colonic polyps     Patient Active Problem List   Diagnosis Date Noted  . Status post right partial knee replacement 10/13/2017  . Contact dermatitis due to poison ivy 05/11/2017  . Borderline diabetes 10/15/2015  . Preventative health care 12/20/2014  . Blood in stool 06/17/2013    Past Surgical History:  Procedure Laterality Date  . KNEE ARTHROSCOPY Right   . PARTIAL KNEE ARTHROPLASTY Right 10/13/2017   Procedure: UNICOMPARTMENTAL KNEE;  Surgeon: Corky Mull, MD;  Location: [facility] ORS;  Service: Orthopedics;  Laterality: Right;    Prior to Admission medications   Medication Sig Start Date End Date Taking? Authorizing Provider  acetaminophen (TYLENOL 8 HOUR) 650 MG CR tablet Take 650 mg by mouth every 8 (eight) hours as needed for pain.    [provider]  enoxaparin (LOVENOX) 40 MG/0.4ML injection Inject 0.4 mLs (40 mg total) into the skin daily. 10/14/17   Lattie Corns, PA-C  Multiple Vitamins-Minerals (CENTRUM SILVER PO) Take 1 tablet by mouth daily.    [provider]  naproxen sodium (ANAPROX) 220 MG tablet Take 440 mg by mouth 2 (two) times daily.     [provider]  oxyCODONE (OXY IR/ROXICODONE) 5 MG immediate release tablet Take 1-2 tablets (5-10 mg total) by mouth every 4 (four) hours as needed for  moderate pain. 10/14/17   Lattie Corns, PA-C  triamcinolone cream (KENALOG) 0.1 % Apply 1 application topically 2 (two) times daily. Patient not taking: Reported on 09/21/2017 06/10/17   Owens Loffler, MD    Allergies Patient has no known allergies.  Family History  Problem Relation Age of Onset  . Aneurysm Paternal Grandmother     Social History Social History   Tobacco Use  . Smoking status: Never Smoker  . Smokeless tobacco: Never Used  Substance Use Topics  . Alcohol use: No  . Drug use: No     Review of Systems  Constitutional: No fever/chills Eyes: No visual changes. No discharge ENT: No upper respiratory complaints. Cardiovascular: no chest pain. Respiratory: no cough. No SOB. Gastrointestinal: No abdominal pain.  No nausea, no vomiting.  No diarrhea.  No constipation. Musculoskeletal: Patient has arthroplasty incision. Skin: Negative for rash, abrasions, lacerations, ecchymosis. Neurological: Negative for headaches, focal weakness or numbness.   ____________________________________________   PHYSICAL EXAM:  VITAL SIGNS: ED Triage Vitals  Enc Vitals Group     BP 10/14/17 2013 (!) 142/80     Pulse Rate 10/14/17 2013 89     Resp 10/14/17 2013 20     Temp 10/14/17 2013 97.8 F (36.6 C)     Temp Source 10/14/17 2013 Oral     SpO2 10/14/17 2013 99 %     Weight 10/14/17 2013 185 lb (83.9 kg)     Height 10/14/17 2013 5\' 9"  (1.753 m)  Head Circumference --      Peak Flow --      Pain Score 10/14/17 2155 2     Pain Loc --      Pain Edu? --      Excl. in [place]? --      Constitutional: Alert and oriented. Well appearing and in no acute distress. Eyes: Conjunctivae are normal. PERRL. EOMI. Head: Atraumatic. Cardiovascular: Normal rate, regular rhythm. Normal S1 and S2.  Good peripheral circulation. Respiratory: Normal respiratory effort without tachypnea or retractions. Lungs CTAB. Good air entry to the bases with no decreased or absent breath  sounds. Musculoskeletal: Right unicompartmental incision site visualized with staples.  No dehiscence of incision.  Patient has limited range of motion of the right knee, likely secondary to pain.  No erythema or surrounding cellulitis.  Palpable dorsalis pedis pulse, right. Neurologic:  Normal speech and language. No gross focal neurologic deficits are appreciated.  Skin:  Skin is warm, dry and intact. No rash noted. Psychiatric: Mood and affect are normal. Speech and behavior are normal. Patient exhibits appropriate insight and judgement.   ____________________________________________   LABS (all labs ordered are listed, but only abnormal results are displayed)  Labs Reviewed - No data to display ____________________________________________  EKG   ____________________________________________  RADIOLOGY   No results found.  ____________________________________________    PROCEDURES  Procedure(s) performed:    Procedures    Medications - No data to display   ____________________________________________   INITIAL IMPRESSION / ASSESSMENT AND PLAN / ED COURSE  Pertinent labs & imaging results that were available during my care of the patient were reviewed by me and considered in my medical decision making (see chart for details).  Review of the [place] CSRS was performed in accordance of the [place] prior to dispensing any controlled drugs.    Assessment and plan Feared complaint without diagnosis Patient presents to the emergency department with bleeding from surgical site.  Patient received Lovenox after he had a no dehiscence of the surgical site.  Unicompartmental knee arthroplasty.  Reassurance was given.  Patient was advised to follow-up with Dr. Roland Rack.     ____________________________________________  FINAL CLINICAL IMPRESSION(S) / ED DIAGNOSES  Final diagnoses:  Feared complaint without diagnosis      NEW MEDICATIONS STARTED DURING THIS VISIT:  ED  Discharge Orders    None          This chart was dictated using voice recognition software/Dragon. Despite best efforts to proofread, errors can occur which can change the meaning. Any change was purely unintentional.    Lannie Fields, PA-C 10/14/17 2218    Schuyler Amor, MD 10/14/17 2228

## 2017-10-14 NOTE — Progress Notes (Signed)
Clinical Social Worker (CSW) received SNF consult. PT is recommending outpatient PT. RN case manager aware of above. Please reconsult if future social work needs arise. CSW signing off.   Momin Misko, LCSW [phone]  

## 2017-10-14 NOTE — Progress Notes (Signed)
DISCHARGE NOTE:  Pt given discharge instructions and prescriptions. Pt verbalized understanding. Pt wheeled to car by staff.  

## 2017-10-14 NOTE — Anesthesia Postprocedure Evaluation (Signed)
Anesthesia Post Note  Patient: Cody Pearson  Procedure(s) Performed: UNICOMPARTMENTAL KNEE (Right Knee)  Patient location during evaluation: Nursing Unit Anesthesia Type: Spinal Level of consciousness: awake and alert and oriented Pain management: pain level controlled Vital Signs Assessment: post-procedure vital signs reviewed and stable Respiratory status: spontaneous breathing Cardiovascular status: stable Postop Assessment: no headache, no backache, no apparent nausea or vomiting, adequate PO intake and patient able to bend at knees Anesthetic complications: no     Last Vitals:  Vitals:   10/14/17 0013 10/14/17 0337  BP: 97/64 (!) 98/55  Pulse: 99 69  Resp: 18 18  Temp: 37.1 C 36.7 C  SpO2: 100% 98%    Last Pain:  Vitals:   10/14/17 0441  TempSrc:   PainSc: Yolanda Manges

## 2017-10-14 NOTE — Progress Notes (Signed)
Physical Therapy Treatment Patient Details Name: Cody Pearson MRN: [id number] DOB: 02-17-1951 Today's Date: 10/14/2017    History of Present Illness Pt is a 67 y/o M s/p R unicondylar knee arthroplasty.  Pt's PMH includes cancer.    PT Comments    [name] continues to make good progress with mobility.  He tolerated progression of therapeutic exercises this session.  Pt ambulated 160 ft with RW and supervision for safety.  Pt will benefit from continued skilled PT services in the outpatient setting to increase functional independence and safety.    Follow Up Recommendations  Outpatient PT     Equipment Recommendations  None recommended by PT    Recommendations for Other Services       Precautions / Restrictions Precautions Precautions: Fall Restrictions Weight Bearing Restrictions: Yes RLE Weight Bearing: Weight bearing as tolerated    Mobility  Bed Mobility               General bed mobility comments: Pt sitting in chair at start and end of session  Transfers Overall transfer level: Needs assistance Equipment used: Rolling walker (2 wheeled) Transfers: Sit to/from Stand Sit to Stand: Supervision         General transfer comment: Pt demonstrates safe and proper hand placement with sit<>stand.  Supervision for safety.   Ambulation/Gait Ambulation/Gait assistance: Supervision Ambulation Distance (Feet): 160 Feet Assistive device: Rolling walker (2 wheeled) Gait Pattern/deviations: Step-through pattern;Decreased stance time - right;Decreased step length - left;Decreased weight shift to right;Antalgic;Staggering left;Staggering right Gait velocity: slightly decreased Gait velocity interpretation: Below normal speed for age/gender General Gait Details: Pt demonstrates step through gait pattern.  No unsteadiness appreciated.     Stairs            Wheelchair Mobility    Modified Rankin (Stroke Patients Only)       Balance Overall balance  assessment: Needs assistance Sitting-balance support: No upper extremity supported;Feet supported Sitting balance-Leahy Scale: Good     Standing balance support: No upper extremity supported;During functional activity Standing balance-Leahy Scale: Fair Standing balance comment: Pt able to stand statically without UE support but relies on UE support for dynamic activities                            Cognition Arousal/Alertness: Awake/alert Behavior During Therapy: WFL for tasks assessed/performed Overall Cognitive Status: Within Functional Limits for tasks assessed                                        Exercises Total Joint Exercises Straight Leg Raises: Strengthening;AROM;Right;10 reps;Seated Long Arc Quad: Strengthening;AROM;Right;10 reps;Seated Knee Flexion: AAROM;Right;10 reps;Other (comment);Seated(with 5 second holds) Marching in Standing: Both;10 reps;Standing;Other (comment)(with BUEs supported) Other Exercises Other Exercises: Standing knee flexion x10 with BUEs supported Other Exercises: Mini squats with BUEs supported x10    General Comments        Pertinent Vitals/Pain Pain Assessment: 0-10 Pain Score: 4  Pain Location: R knee Pain Descriptors / Indicators: Aching;Discomfort Pain Intervention(s): Limited activity within patient's tolerance;Monitored during session;Ice applied    Home Living                      Prior Function            PT Goals (current goals can now be found in the care plan section) Acute Rehab  PT Goals Patient Stated Goal: to return to PLOF PT Goal Formulation: With patient Time For Goal Achievement: 10/27/17 Potential to Achieve Goals: Good Progress towards PT goals: Progressing toward goals    Frequency    BID      PT Plan Current plan remains appropriate    Co-evaluation              AM-PAC PT "6 Clicks" Daily Activity  Outcome Measure  Difficulty turning over in bed  (including adjusting bedclothes, sheets and blankets)?: None Difficulty moving from lying on back to sitting on the side of the bed? : A Little Difficulty sitting down on and standing up from a chair with arms (e.g., wheelchair, bedside commode, etc,.)?: A Little Help needed moving to and from a bed to chair (including a wheelchair)?: A Little Help needed walking in hospital room?: A Little Help needed climbing 3-5 steps with a railing? : A Little 6 Click Score: 19    End of Session Equipment Utilized During Treatment: Gait belt Activity Tolerance: Patient tolerated treatment well Patient left: in chair;with call bell/phone within reach;with chair alarm set;Other (comment);with family/visitor present(with bone foam and polar care) Nurse Communication: Mobility status PT Visit Diagnosis: Pain;Unsteadiness on feet (R26.81);Other abnormalities of gait and mobility (R26.89);Muscle weakness (generalized) (M62.81);Difficulty in walking, not elsewhere classified (R26.2) Pain - Right/Left: Right Pain - part of body: Knee     Time: 7026-3785 PT Time Calculation (min) (ACUTE ONLY): 17 min  Charges:  $Therapeutic Exercise: 8-22 mins                    G Codes:       Collie Siad PT, DPT 10/14/2017, 3:53 PM

## 2017-10-14 NOTE — Care Management Note (Signed)
Case Management Note  Patient Details  Name: Cody Pearson MRN: [id number] Date of Birth: [birth year]  Subjective/Objective:  POD # 1 unicompartmental right knee. Discharging today. Met with patient at bedside to discuss discharge planning. He lives alone but will have assistance from family and friends. He has a walker. Pharmacy: [facility] [employer]  [phone]. Called Lovenox 40 mg # 14 no refills. Cost of Lovenox is $ 0. First outpatient PT appointment scheduled with [facility]  for 3/7 @ 1:00 pm. Patient updated on POC.                Action/Plan: OP PT with Jefm Bryant. No DME.   Expected Discharge Date:  10/14/17               Expected Discharge Plan:  OP Rehab  In-House Referral:     Discharge planning Services  CM Consult  Post Acute Care Choice:    Choice offered to:     DME Arranged:    DME Agency:     HH Arranged:    HH Agency:     Status of Service:  Completed, signed off  If discussed at [employer] of Stay Meetings, dates discussed:    Additional Comments:  Jolly Mango, RN 10/14/2017, 8:49 AM

## 2017-10-14 NOTE — Discharge Summary (Signed)
Physician Discharge Summary  Patient ID: Cody Pearson MRN: [id number] DOB/AGE: [birth year] 67 y.o.  Admit date: 10/13/2017 Discharge date: 10/14/2017  Admission Diagnoses:  PRIMARY OSTEOARTHRITIS OF RIGHT KNEE  Discharge Diagnoses: Patient Active Problem List   Diagnosis Date Noted  . Status post right partial knee replacement 10/13/2017  . Contact dermatitis due to poison ivy 05/11/2017  . Borderline diabetes 10/15/2015  . Preventative health care 12/20/2014  . Blood in stool 06/17/2013  Primary osteoarthritis of the right knee.  Past Medical History:  Diagnosis Date  . Arthritis   . Cancer ([place])    melanoma  . History of colonic polyps      Transfusion: None.   Consultants (if any):   Discharged Condition: Improved  Hospital Course: Cody Pearson is an 67 y.o. male who was admitted 10/13/2017 with a diagnosis of right knee osteoarthritis, specifically the medial compartment of the right knee and went to the operating room on 10/13/2017 and underwent the above named procedures.    Surgeries: Procedure(s): UNICOMPARTMENTAL KNEE on 10/13/2017 Patient tolerated the surgery well. Taken to PACU where she was stabilized and then transferred to the orthopedic floor.  Started on Lovenox 40mg  q 24 hrs. Foot pumps applied bilaterally at 80 mm. Heels elevated on bed with rolled towels. No evidence of DVT. Negative [place]. Physical therapy started on day #1 for gait training and transfer. OT started day #1 for ADL and assisted devices.  Patient's IV was removed on POD1.  Foley removed following surgery.  Implants: All-cemented [employer] [place] system with a medium femoral component, a "C" sized tibial tray, and a 4 mm meniscal bearing insert.  He was given perioperative antibiotics:  Anti-infectives (From admission, onward)   Start     Dose/Rate Route Frequency Ordered Stop   10/13/17 1400  ceFAZolin (ANCEF) IVPB 2g/100 mL premix     2 g 200 mL/hr over 30 Minutes Intravenous Every  6 hours 10/13/17 1141 10/14/17 0410   10/13/17 0607  ceFAZolin (ANCEF) 2-4 GM/100ML-% IVPB    Comments:  Lorenza [place]   : cabinet override      10/13/17 0607 10/13/17 0750   10/12/17 2230  ceFAZolin (ANCEF) IVPB 2g/100 mL premix     2 g 200 mL/hr over 30 Minutes Intravenous  Once 10/12/17 2228 10/13/17 0750    .  He was given sequential compression devices, early ambulation, and Lovenox for DVT prophylaxis.  He benefited maximally from the hospital stay and there were no complications.    Recent vital signs:  Vitals:   10/14/17 0013 10/14/17 0337  BP: 97/64 (!) 98/55  Pulse: 99 69  Resp: 18 18  Temp: 98.7 F (37.1 C) 98.1 F (36.7 C)  SpO2: 100% 98%    Recent laboratory studies:  Lab Results  Component Value Date   HGB 13.2 10/14/2017   HGB 14.7 09/30/2017   HGB 15.1 12/20/2014   Lab Results  Component Value Date   WBC 14.8 (H) 10/14/2017   PLT 202 10/14/2017   No results found for: INR Lab Results  Component Value Date   NA 136 10/14/2017   K 4.3 10/14/2017   CL 106 10/14/2017   CO2 25 10/14/2017   BUN 16 10/14/2017   CREATININE 1.06 10/14/2017   GLUCOSE 160 (H) 10/14/2017    Discharge Medications:   Allergies as of 10/14/2017   No Known Allergies     Medication List    TAKE these medications   CENTRUM SILVER PO Take 1 tablet  by mouth daily.   enoxaparin 40 MG/0.4ML injection Commonly known as:  LOVENOX Inject 0.4 mLs (40 mg total) into the skin daily.   naproxen sodium 220 MG tablet Commonly known as:  ALEVE Take 440 mg by mouth 2 (two) times daily.   oxyCODONE 5 MG immediate release tablet Commonly known as:  Oxy IR/ROXICODONE Take 1-2 tablets (5-10 mg total) by mouth every 4 (four) hours as needed for moderate pain.   triamcinolone cream 0.1 % Commonly known as:  KENALOG Apply 1 application topically 2 (two) times daily.   TYLENOL 8 HOUR 650 MG CR tablet Generic drug:  acetaminophen Take 650 mg by mouth every 8 (eight) hours as needed  for pain.            Durable Medical Equipment  (From admission, onward)        Start     Ordered   10/13/17 1142  DME Walker rolling  Once    Question:  Patient needs a walker to treat with the following condition  Answer:  Status post right partial knee replacement   10/13/17 1141   10/13/17 1142  DME 3 n 1  Once     10/13/17 1141   10/13/17 1142  DME Bedside commode  Once    Question:  Patient needs a bedside commode to treat with the following condition  Answer:  Status post right partial knee replacement   10/13/17 1141      Diagnostic Studies: Dg Chest 2 View  Result Date: 09/30/2017 CLINICAL DATA:  Preop knee replacement EXAM: CHEST  2 VIEW COMPARISON:  08/14/2006 FINDINGS: Heart and mediastinal contours are within normal limits. No focal opacities or effusions. No acute bony abnormality. Degenerative changes in the thoracic spine. IMPRESSION: No active cardiopulmonary disease. Electronically Signed   By: Rolm Baptise M.D.   On: 09/30/2017 09:40   Dg Knee Right Port  Result Date: 10/13/2017 CLINICAL DATA:  Partial knee replacement EXAM: PORTABLE RIGHT KNEE - 1-2 VIEW COMPARISON:  04/29/2017 FINDINGS: Medial unicompartmental arthroplasty that is located. No evidence of periprosthetic fracture. Expected soft tissue gas and swelling. IMPRESSION: No acute finding after medial unicompartmental arthroplasty. Electronically Signed   By: Monte Fantasia M.D.   On: 10/13/2017 11:06   Disposition: Final discharge disposition not confirmed  Follow-up Information    Lattie Corns, PA-C Follow up in 14 day(s).   Specialty:  Physician Assistant Why:  Electa Sniff information: [place] [address] [phone]          Signed: Judson Roch PA-C 10/14/2017, 7:48 AM

## 2017-10-14 NOTE — Progress Notes (Signed)
  Subjective: 1 Day Post-Op Procedure(s) (LRB): UNICOMPARTMENTAL KNEE (Right) Patient reports pain as mild.   Patient is well, and has had no acute complaints or problems Plan is to go Home after hospital stay. Negative for chest pain and shortness of breath Fever: no Gastrointestinal:Negative for nausea and vomiting  Objective: Vital signs in last 24 hours: Temp:  [96 F (35.6 C)-98.7 F (37.1 C)] 98.1 F (36.7 C) (03/06 0337) Pulse Rate:  [69-105] 69 (03/06 0337) Resp:  [12-18] 18 (03/06 0337) BP: (93-114)/(55-82) 98/55 (03/06 0337) SpO2:  [95 %-100 %] 98 % (03/06 0337)  Intake/Output from previous day:  Intake/Output Summary (Last 24 hours) at 10/14/2017 0745 Last data filed at 10/14/2017 0645 Gross per 24 hour  Intake 5573.75 ml  Output 1510 ml  Net 4063.75 ml    Intake/Output this shift: No intake/output data recorded.  Labs: Recent Labs    10/14/17 0301  HGB 13.2   Recent Labs    10/14/17 0301  WBC 14.8*  RBC 4.24*  HCT 38.8*  PLT 202   Recent Labs    10/14/17 0301  NA 136  K 4.3  CL 106  CO2 25  BUN 16  CREATININE 1.06  GLUCOSE 160*  CALCIUM 8.3*   No results for input(s): LABPT, INR in the last 72 hours.   EXAM General - Patient is Alert, Appropriate and Oriented Extremity - ABD soft Sensation intact distally Intact pulses distally Dorsiflexion/Plantar flexion intact Incision: moderate drainage No cellulitis present Dressing/Incision - blood tinged drainage Motor Function - intact, moving foot and toes well on exam.  Abdomen soft with normal BS.  Past Medical History:  Diagnosis Date  . Arthritis   . Cancer ([place])    melanoma  . History of colonic polyps     Assessment/Plan: 1 Day Post-Op Procedure(s) (LRB): UNICOMPARTMENTAL KNEE (Right) Active Problems:   Status post right partial knee replacement  Estimated body mass index is 27.32 kg/m as calculated from the following:   Height as of this encounter: 5\' 9"  (1.753 m).  Weight as of this encounter: 83.9 kg (185 lb). Advance diet Up with therapy D/C IV fluids when tolerating po intake.  Labs reviewed this AM. Hg 13.2 this AM. Worked well with PT yesterday, recommending outpatient PT. Plan will be for discharge home this afternoon following morning session with PT.   DVT Prophylaxis - Lovenox, Foot Pumps and TED hose Weight-Bearing as tolerated to right leg  J. Cameron Proud, PA-C [facility] Orthopaedic Surgery 10/14/2017, 7:45 AM

## 2017-10-15 DIAGNOSIS — M25562 Pain in left knee: Secondary | ICD-10-CM | POA: Diagnosis not present

## 2017-10-19 ENCOUNTER — Telehealth: Payer: Self-pay

## 2017-10-19 DIAGNOSIS — M25562 Pain in left knee: Secondary | ICD-10-CM | POA: Diagnosis not present

## 2017-10-19 NOTE — Telephone Encounter (Signed)
Flagged on EMMI report for not reading discharge papers.  Called and spoke with patient.  He mentioned he did receive them, though may have misplaced them.  Requested another copy if possible.  I mentioned I would be glad to send him another copy and confirmed address to send to.  Mentioned it should go out in today's mail and to be on the lookout for it in the next day or so.  He reports no further questions or concerns at this time.

## 2017-10-21 DIAGNOSIS — M25562 Pain in left knee: Secondary | ICD-10-CM | POA: Diagnosis not present

## 2017-10-22 ENCOUNTER — Telehealth: Payer: Self-pay

## 2017-10-22 NOTE — Telephone Encounter (Signed)
EMMI Follow-up: Flagged on EMMI report for other questions/problems. Pt. Said he had pain on hip & groin area for the last couple of days but it has subsided today. Didn't know if he was having a reaction to something but felt like a needle was in him.  When he places billfold in his pocket he had pain/sting sensation. I told him he call me back if pain returns as his follow-up appointment is not until next Wednesday.

## 2017-10-23 DIAGNOSIS — M25562 Pain in left knee: Secondary | ICD-10-CM | POA: Diagnosis not present

## 2017-11-06 DIAGNOSIS — M25562 Pain in left knee: Secondary | ICD-10-CM | POA: Diagnosis not present

## 2017-11-12 DIAGNOSIS — M25562 Pain in left knee: Secondary | ICD-10-CM | POA: Diagnosis not present

## 2017-11-23 DIAGNOSIS — Z96651 Presence of right artificial knee joint: Secondary | ICD-10-CM | POA: Diagnosis not present

## 2018-03-09 ENCOUNTER — Ambulatory Visit: Payer: Self-pay | Admitting: Primary Care

## 2018-03-09 NOTE — Telephone Encounter (Signed)
I spoke with pt; pt took 4 Amoxicillin prior to dental appt on 03/02/18; pt still has some symptoms with h/a and throat muscles hurt. Pt said he can wait for appt on 03/10/18. If prior to appt pt has swelling in mouth, tongue or throat or difficulty breathing pt will go to ED otherwise pt will keep appt for 03/10/18.Amoxicillin not added to allergy list until eval. FYI to Dr Lorelei Pont.

## 2018-03-09 NOTE — Telephone Encounter (Signed)
He called in c/o "having a possible reaction to Amoxicillin".    "It makes me feel like this every time I take it".   See triage notes for complete details.  I scheduled him with Dr. Frederico Hamman Copland for 7/31 at 9:40.      Reason for Disposition . [1] MODERATE weakness (i.e., interferes with work, school, normal activities) AND [2] persists > 3 days  Answer Assessment - Initial Assessment Questions 1. DESCRIPTION: "Describe how you are feeling."     He had a partial knee replacement in March of this year.   He needed to have dental work done (root canal) so he was prescribed Amoxicillin to take before the dental surgery due to the knee replacement. He states that every time he takes Amoxicillin it makes him feel bad: Weak, mild dizziness, feverish, body aches, chills and sweaty.  (He had to take it before and it made him feel this way).   He is also c/o his neck an  Hurting last night.   He took some Tylenol last night and it resolved the neck pain.    He c/o his joints aching too. Yesterday he felt better but today he woke up feeling sweaty and weak again.  He denies any pain/discomfort around the root canal.   I asked if he had spoke to his dentist he said,  "No" because he felt this way last time he had to take Amoxicillin.   He is not for any more dental work.    His orthopedic doctor is the one who said he needed to take Amoxicillin before any dental work due to the knee replacement.    He called their office and but they him to call his PCP.  2. SEVERITY: "How bad is it?"  "Can you stand and walk?"   - MILD - Feels weak or tired, but does not interfere with work, school or normal activities   - [place] to stand and walk; weakness interferes with work, school, or normal activities   - SEVERE - Unable to stand or walk     See above note.   Ambulating fine. 3. ONSET:  "When did the weakness begin?"     03/02/18.   Had root canal done but was on Amoxicillin prior due to knee replacement  surgery. 4. CAUSE: "What do you think is causing the weakness?"     Amoxicillin.    It made me feel this way when I had to take it before. 5. MEDICINES: "Have you recently started a new medicine or had a change in the amount of a medicine?"     No.   Just the Amoxicillin for preparation for the root canal. 6. OTHER SYMPTOMS: "Do you have any other symptoms?" (e.g., chest pain, fever, cough, SOB, vomiting, diarrhea, bleeding, other areas of pain)     Body aches, chills, fever.    He was at work when he called so did not have a way to check his temperature. 7. PREGNANCY: "Is there any chance you are pregnant?" "When was your last menstrual period?"     N/A  Protocols used: WEAKNESS (GENERALIZED) AND FATIGUE-A-AH

## 2018-03-10 ENCOUNTER — Ambulatory Visit (INDEPENDENT_AMBULATORY_CARE_PROVIDER_SITE_OTHER): Payer: Commercial Managed Care - PPO | Admitting: Family Medicine

## 2018-03-10 ENCOUNTER — Encounter: Payer: Self-pay | Admitting: Family Medicine

## 2018-03-10 ENCOUNTER — Encounter: Payer: Commercial Managed Care - PPO | Admitting: Primary Care

## 2018-03-10 VITALS — BP 90/66 | HR 86 | Temp 98.0°F | Ht 69.25 in | Wt 194.0 lb

## 2018-03-10 DIAGNOSIS — M2550 Pain in unspecified joint: Secondary | ICD-10-CM | POA: Diagnosis not present

## 2018-03-10 DIAGNOSIS — T50905A Adverse effect of unspecified drugs, medicaments and biological substances, initial encounter: Secondary | ICD-10-CM

## 2018-03-10 MED ORDER — CLINDAMYCIN HCL 300 MG PO CAPS: ORAL_CAPSULE | ORAL | 3 refills | Status: DC

## 2018-03-10 NOTE — Progress Notes (Signed)
Dr. Frederico Hamman T. Tonda Wiederhold, MD, [facility] [place], [address] Phone: [phone] Fax: [phone]  03/10/2018  Patient: Cody Pearson, MRN: [id number], DOB: 07-08-1951, 67 y.o.  Primary Physician:  Pleas Koch, NP   Chief Complaint  Patient presents with  . Neck Pain    Feels it is related to the amoxicillin he took for dental appt  . Pain in joints  . Chills   Subjective:   Cody Pearson is a 67 y.o. very pleasant male patient who presents with the following:  Took 4 pills about a month ago - will have sweats, fever. Starting last Tuesday, stomach was hurting and not feeling well and took some Activia and some probiotics.  He had almost the exact same sensation 1 month prior to taking a prior 4 day course of amox.  Eating none this morning. 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 Exposed to memory care.   Past Medical History, Surgical History, Social History, Family History, Problem List, Medications, and Allergies have been reviewed and updated if relevant.  Patient Active Problem List   Diagnosis Date Noted  . Status post right partial knee replacement 10/13/2017  . Contact dermatitis due to poison ivy 05/11/2017  . Borderline diabetes 10/15/2015  . Preventative health care 12/20/2014  . Blood in stool 06/17/2013    Past Medical History:  Diagnosis Date  . Arthritis   . Cancer ([place])    melanoma  . History of colonic polyps     Past Surgical History:  Procedure Laterality Date  . KNEE ARTHROSCOPY Right   . PARTIAL KNEE ARTHROPLASTY Right 10/13/2017   Procedure: UNICOMPARTMENTAL KNEE;  Surgeon: Corky Mull, MD;  Location: [facility] ORS;  Service: Orthopedics;  Laterality: Right;    Social History   Socioeconomic History  . Marital status: Widowed    Spouse name: Not on file  . Number of children: 3  . Years of education: Not on file  . Highest education level: Not on file  Occupational  History  . Occupation: Maintenance    Employer: [employer]   Social Needs  . Financial resource strain: Not on file  . Food insecurity:    Worry: Not on file    Inability: Not on file  . Transportation needs:    Medical: Not on file    Non-medical: Not on file  Tobacco Use  . Smoking status: Never Smoker  . Smokeless tobacco: Never Used  Substance and Sexual Activity  . Alcohol use: No  . Drug use: No  . Sexual activity: Not on file  Lifestyle  . Physical activity:    Days per week: Not on file    Minutes per session: Not on file  . Stress: Not on file  Relationships  . Social connections:    Talks on phone: Not on file    Gets together: Not on file    Attends religious service: Not on file    Active member of club or organization: Not on file    Attends meetings of clubs or organizations: Not on file    Relationship status: Not on file  . Intimate partner violence:    Fear of current or ex partner: Not on file    Emotionally abused: Not on file    Physically abused: Not on file    Forced sexual activity: Not on file  Other Topics Concern  . Not on file  Social  History Narrative   Works at [employer] with maintenance group.   Highest level of education 12th grade.   Married.   Lives locally.    Family History  Problem Relation Age of Onset  . Aneurysm Paternal Grandmother     No Known Allergies  Medication list reviewed and updated in full in [place].   GEN: No acute illnesses, no fevers, chills. GI: No n/v/d, eating normally Pulm: No SOB Interactive and getting along well at home.  Otherwise, ROS is as per the HPI.  Objective:   BP 90/66   Pulse 86   Temp 98 F (36.7 C) (Oral)   Ht 5' 9.25" (1.759 m)   Wt 194 lb (88 kg)   BMI 28.44 kg/m   GEN: WDWN, NAD, Non-toxic, A & O x 3 HEENT: Atraumatic, Normocephalic. Neck supple. No masses, No LAD. Ears and Nose: No external deformity. CV: RRR, No M/G/R. No JVD. No thrill. No  extra heart sounds. PULM: CTA B, no wheezes, crackles, rhonchi. No retractions. No resp. distress. No accessory muscle use. EXTR: No c/c/e NEURO Normal gait.  PSYCH: Normally interactive. Conversant. Not depressed or anxious appearing.  Calm demeanor.   Laboratory and Imaging Data:  Assessment and Plan:   Polyarthralgia  Adverse effect of drug, initial encounter  Question whether this is due to drug side effect, but reasonable to use cleocin protocol instead.   I will defer to his orthopedist if he feels antibiotics prior to dental work is appropriate.   Follow-up: No follow-ups on file.  Meds ordered this encounter  Medications  . clindamycin (CLEOCIN) 300 MG capsule    Sig: 2 caps po 1 hour before dental procedures    Dispense:  2 capsule    Refill:  3   Signed,  Tifany Hirsch T. Jerol Rufener, MD   Allergies as of 03/10/2018      Reactions   Amoxicillin Nausea Only   Nausea, anorexia, body aches      Medication List        Accurate as of 03/10/18 10:55 AM. Always use your most recent med list.          CENTRUM SILVER PO Take 1 tablet by mouth daily.   clindamycin 300 MG capsule Commonly known as:  CLEOCIN 2 caps po 1 hour before dental procedures   naproxen sodium 220 MG tablet Commonly known as:  ALEVE Take 440 mg by mouth 2 (two) times daily.   TYLENOL 8 HOUR 650 MG CR tablet Generic drug:  acetaminophen Take 650 mg by mouth every 8 (eight) hours as needed for pain.

## 2018-03-26 ENCOUNTER — Ambulatory Visit (INDEPENDENT_AMBULATORY_CARE_PROVIDER_SITE_OTHER): Payer: Commercial Managed Care - PPO | Admitting: Primary Care

## 2018-03-26 ENCOUNTER — Encounter: Payer: Self-pay | Admitting: Primary Care

## 2018-03-26 ENCOUNTER — Encounter: Payer: Commercial Managed Care - PPO | Admitting: Primary Care

## 2018-03-26 VITALS — BP 104/70 | HR 61 | Temp 97.8°F | Ht 69.0 in | Wt 193.0 lb

## 2018-03-26 DIAGNOSIS — Z0000 Encounter for general adult medical examination without abnormal findings: Secondary | ICD-10-CM | POA: Diagnosis not present

## 2018-03-26 DIAGNOSIS — E785 Hyperlipidemia, unspecified: Secondary | ICD-10-CM

## 2018-03-26 DIAGNOSIS — Z125 Encounter for screening for malignant neoplasm of prostate: Secondary | ICD-10-CM | POA: Diagnosis not present

## 2018-03-26 DIAGNOSIS — Z1159 Encounter for screening for other viral diseases: Secondary | ICD-10-CM

## 2018-03-26 DIAGNOSIS — R9431 Abnormal electrocardiogram [ECG] [EKG]: Secondary | ICD-10-CM

## 2018-03-26 DIAGNOSIS — R7303 Prediabetes: Secondary | ICD-10-CM | POA: Diagnosis not present

## 2018-03-26 DIAGNOSIS — Z96651 Presence of right artificial knee joint: Secondary | ICD-10-CM

## 2018-03-26 LAB — COMPREHENSIVE METABOLIC PANEL
ALBUMIN: 4.1 g/dL (ref 3.5–5.2)
ALK PHOS: 56 U/L (ref 39–117)
ALT: 13 U/L (ref 0–53)
AST: 15 U/L (ref 0–37)
BILIRUBIN TOTAL: 0.5 mg/dL (ref 0.2–1.2)
BUN: 15 mg/dL (ref 6–23)
CALCIUM: 9.2 mg/dL (ref 8.4–10.5)
CO2: 32 meq/L (ref 19–32)
CREATININE: 1.16 mg/dL (ref 0.40–1.50)
Chloride: 106 mEq/L (ref 96–112)
GFR: 67.02 mL/min (ref >60.00)
Glucose, Bld: 103 mg/dL — ABNORMAL HIGH (ref 70–99)
Potassium: 4.2 mEq/L (ref 3.5–5.1)
Sodium: 140 mEq/L (ref 135–145)
TOTAL PROTEIN: 6.2 g/dL (ref 6.0–8.3)

## 2018-03-26 LAB — LIPID PANEL
CHOL/HDL RATIO: 4
CHOLESTEROL: 164 mg/dL (ref 0–200)
HDL: 39.2 mg/dL (ref >39.00)
LDL Cholesterol: 110 mg/dL — ABNORMAL HIGH (ref 0–99)
NonHDL: 124.37
TRIGLYCERIDES: 72 mg/dL (ref 0.0–149.0)
VLDL: 14.4 mg/dL (ref 0.0–40.0)

## 2018-03-26 LAB — HEMOGLOBIN A1C: Hgb A1c MFr Bld: 5.9 % (ref 4.6–6.5)

## 2018-03-26 LAB — PSA, MEDICARE: PSA: 0.91 ng/ml (ref 0.10–4.00)

## 2018-03-26 NOTE — Patient Instructions (Signed)
It's important to improve your diet by reducing consumption of fast food, fried food, processed snack foods, sugary drinks. Increase consumption of fresh vegetables and fruits, whole grains, water.  Ensure you are drinking 64 ounces of water daily.  Start exercising. You should be getting 150 minutes of exercise weekly.  Stop by the lab prior to leaving today. I will notify you of your results once received.   Follow up in 1 year for your annual exam with me and Medicare Wellness Visit with our nurse or sooner if needed.  It was a pleasure to see you today!

## 2018-03-26 NOTE — Assessment & Plan Note (Signed)
Td UTD, Prevnar due today, provided. Declines Shingrix. Will get flu vaccination at work. Colonoscopy UTD, due in 2020. PSA pending. Recommended regular exercise and to improve diet. Exam unremarkable. Labs pending. All recommendations provided at end of visit.   I have personally reviewed and have noted: 1. The patient's medical and social history 2. Their use of alcohol, tobacco or illicit drugs 3. Their current medications and supplements 4. The patient's functional ability including ADL's, fall  risks, home safety risks and hearing or visual  impairment. 5. Diet and physical activities 6. Evidence for depression or mood disorder

## 2018-03-26 NOTE — Assessment & Plan Note (Signed)
Doing well, last follow up visit with Ortho in June 2019.

## 2018-03-26 NOTE — Assessment & Plan Note (Signed)
ECG today with t-wave inversion to V2 which is a change from February 2019.   He is asymptomatic and feels great. Will repeat ECG in 2 months. Strict return precautions provided regarding shortness of breath, chest pain, dizziness.

## 2018-03-26 NOTE — Progress Notes (Addendum)
Patient ID: Cody Pearson, male   DOB: [birth year], 67 y.o.   MRN: [id number]  Cody Pearson is a 67 year old male who presents today for Welcome to Medicare Visit and CPE.  HPI:  Past Medical History:  Diagnosis Date  . Arthritis   . Cancer ([place])    melanoma  . History of colonic polyps     Current Outpatient Medications  Medication Sig Dispense Refill  . acetaminophen (TYLENOL 8 HOUR) 650 MG CR tablet Take 650 mg by mouth every 8 (eight) hours as needed for pain.    . clindamycin (CLEOCIN) 300 MG capsule 2 caps po 1 hour before dental procedures 2 capsule 3  . Multiple Vitamins-Minerals (CENTRUM SILVER PO) Take 1 tablet by mouth daily.    . naproxen sodium (ANAPROX) 220 MG tablet Take 440 mg by mouth 2 (two) times daily.      No current facility-administered medications for this visit.     Allergies  Allergen Reactions  . Amoxicillin Nausea Only    Nausea, anorexia, body aches    Family History  Problem Relation Age of Onset  . Aneurysm Paternal Grandmother     Social History   Socioeconomic History  . Marital status: Widowed    Spouse name: Not on file  . Number of children: 3  . Years of education: Not on file  . Highest education level: Not on file  Occupational History  . Occupation: Maintenance    Employer: [employer]   Social Needs  . Financial resource strain: Not on file  . Food insecurity:    Worry: Not on file    Inability: Not on file  . Transportation needs:    Medical: Not on file    Non-medical: Not on file  Tobacco Use  . Smoking status: Never Smoker  . Smokeless tobacco: Never Used  Substance and Sexual Activity  . Alcohol use: No  . Drug use: No  . Sexual activity: Not on file  Lifestyle  . Physical activity:    Days per week: Not on file    Minutes per session: Not on file  . Stress: Not on file  Relationships  . Social connections:    Talks on phone: Not on file    Gets together: Not on file    Attends religious service: Not  on file    Active member of club or organization: Not on file    Attends meetings of clubs or organizations: Not on file    Relationship status: Not on file  . Intimate partner violence:    Fear of current or ex partner: Not on file    Emotionally abused: Not on file    Physically abused: Not on file    Forced sexual activity: Not on file  Other Topics Concern  . 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   Highest level of education 12th grade.   Married.   Lives locally.    Hospitiliaztions:  Health Maintenance:    Flu: Due this season   Tetanus: Completed in 2012  Pneumovax: Never completed  Prevnar: Due today  Shingles: Declines   Colonoscopy: Completed in 2015, due in 2020  Eye Doctor: Completes annually   Dental Exam: Completes semi-annually   PSA: Completed in 2016, pending.  Hep C Screening: Due today    Providers: [facility]; [place]; Belenda Cruise Taylor Spilde-PCP; Dr. Katrine Coho; Dermatology    I have personally reviewed  and have noted: 1. The patient's medical and social history 2. Their use of alcohol, tobacco or illicit drugs 3. Their current medications and supplements 4. The patient's functional ability including ADL's, fall risks, home safety risks  and hearing or visual impairment. 5. Diet and physical activities 6. Evidence for depression or mood disorder  Subjective:   Review of Systems:   Constitutional: Denies fever, malaise, fatigue, headache or abrupt weight changes.  HEENT: Denies eye pain, eye redness, ear pain, ringing in the ears, wax buildup, runny nose, nasal congestion, bloody nose, or sore throat. Respiratory: Denies difficulty breathing, shortness of breath, cough or sputum production.   Cardiovascular: Denies chest pain, chest tightness, palpitations or swelling in the hands or feet.  Gastrointestinal: Denies abdominal pain, bloating, constipation, diarrhea or blood in the stool.   GU: Denies urgency, frequency, pain with urination, burning sensation, blood in urine, odor or discharge. Some slow urination, empties bladder most of the time.  Musculoskeletal: Denies decrease in range of motion, difficulty with gait, muscle pain or joint pain and swelling.  Skin: Denies redness, rashes, lesions or ulcercations. Follows with dermatology. Neurological: Denies dizziness, difficulty with memory, difficulty with speech or problems with balance and coordination.  Psychiatric: Denies concerns for anxiety or depression.   No other specific complaints in a complete review of systems (except as listed in HPI above).  Objective:  PE:   There were no vitals taken for this visit. Wt Readings from Last 3 Encounters:  03/10/18 194 lb (88 kg)  10/14/17 185 lb (83.9 kg)  10/13/17 185 lb (83.9 kg)    General: Appears their stated age, well developed, well nourished in NAD. Skin: Warm, dry and intact. No rashes, lesions or ulcerations noted. HEENT: Head: normal shape and size; Eyes: sclera white, no icterus, conjunctiva pink, PERRLA and EOMs intact; Ears: Tm's gray and intact, normal light reflex; Nose: mucosa pink and moist, septum midline; Throat/Mouth: Teeth present, mucosa pink and moist, no exudate, lesions or ulcerations noted.  Neck: Normal range of motion. Neck supple, trachea midline. No massses, lumps or thyromegaly present.  Cardiovascular: Normal rate and rhythm. S1,S2 noted.  No murmur, rubs or gallops noted. No JVD or BLE edema. No carotid bruits noted. Pulmonary/Chest: Normal effort and positive vesicular breath sounds. No respiratory distress. No wheezes, rales or ronchi noted.  Abdomen: Soft and nontender. Normal bowel sounds, no bruits noted. No distention or masses noted. Liver, spleen and kidneys non palpable. Musculoskeletal: Normal range of motion. No signs of joint swelling. No difficulty with gait.  Neurological: Alert and oriented. Cranial nerves II-XII intact.  Coordination normal. +DTRs bilaterally. Psychiatric: Mood and affect normal. Behavior is normal. Judgment and thought content normal.   EKG: Sinus Bradycardia with rate of 53, T-wave inversion to V2 which is different from ECG in February. No ST elevation or depression. He is asymptomatic. Compared to ECG in February 2019.  BMET    Component Value Date/Time   NA 136 10/14/2017 0301   K 4.3 10/14/2017 0301   CL 106 10/14/2017 0301   CO2 25 10/14/2017 0301   GLUCOSE 160 (H) 10/14/2017 0301   BUN 16 10/14/2017 0301   CREATININE 1.06 10/14/2017 0301   CALCIUM 8.3 (L) 10/14/2017 0301   GFRNONAA >60 10/14/2017 0301   GFRAA >60 10/14/2017 0301    Lipid Panel     Component Value Date/Time   CHOL 202 (H) 01/08/2017 1627   TRIG 97.0 01/08/2017 1627   HDL 45.00 01/08/2017 1627   CHOLHDL  4 01/08/2017 1627   VLDL 19.4 01/08/2017 1627   LDLCALC 138 (H) 01/08/2017 1627    CBC    Component Value Date/Time   WBC 14.8 (H) 10/14/2017 0301   RBC 4.24 (L) 10/14/2017 0301   HGB 13.2 10/14/2017 0301   HCT 38.8 (L) 10/14/2017 0301   PLT 202 10/14/2017 0301   MCV 91.5 10/14/2017 0301   MCH 31.1 10/14/2017 0301   MCHC 34.0 10/14/2017 0301   RDW 13.1 10/14/2017 0301   LYMPHSABS 0.6 (L) 10/14/2017 0301   MONOABS 1.1 (H) 10/14/2017 0301   EOSABS 0.0 10/14/2017 0301   BASOSABS 0.0 10/14/2017 0301    Hgb A1C Lab Results  Component Value Date   HGBA1C 5.9 01/08/2017      Assessment and Plan:   Medicare Annual Wellness Visit:  Diet: He endorses a fair diet. Breakfast: Instant breakfast shakes Lunch: Sandwich Dinner: Restaurants, pizza, breakfast food, hamburger, sometimes salad Snacks: Candy bar, cookies, ice cream Desserts: Daily  Beverages: Soda, water, juice Physical activity: Active at work, no regular exercise Depression/mood screen: Negative Hearing: Intact to whispered voice Visual acuity: Grossly normal, performs annual eye exam  ADLs: Capable Fall risk: None Home  safety: Good Cognitive evaluation: Intact to orientation, naming, recall and repetition EOL planning: Adv directives, full code/ I agree  Preventative Medicine: Td UTD, Prevnar due today, provided. Declines Shingrix. Will get flu vaccination at work. Colonoscopy UTD, due in 2020. PSA pending. Recommended regular exercise and to improve diet. Exam unremarkable. Labs pending. All recommendations provided at end of visit.    Next appointment: One year

## 2018-03-26 NOTE — Assessment & Plan Note (Signed)
Repeat A1C pending. Discussed to increase vegetables, fruit, whole grains, lean protein. Limit sodas, sweets. Recommended regular exercise.

## 2018-03-27 LAB — HEPATITIS C ANTIBODY
HEP C AB: NONREACTIVE
SIGNAL TO CUT-OFF: 0.01 (ref <1.00)

## 2018-03-29 ENCOUNTER — Encounter: Payer: Self-pay | Admitting: *Deleted

## 2018-05-12 ENCOUNTER — Encounter: Payer: Self-pay | Admitting: Primary Care

## 2018-05-12 ENCOUNTER — Ambulatory Visit (INDEPENDENT_AMBULATORY_CARE_PROVIDER_SITE_OTHER): Payer: Commercial Managed Care - PPO | Admitting: Primary Care

## 2018-05-12 VITALS — BP 114/68 | HR 78 | Temp 98.2°F | Ht 69.0 in | Wt 194.8 lb

## 2018-05-12 DIAGNOSIS — R9431 Abnormal electrocardiogram [ECG] [EKG]: Secondary | ICD-10-CM | POA: Diagnosis not present

## 2018-05-12 NOTE — Assessment & Plan Note (Signed)
Unchanged from ECG in August 2019. He is asymptomatic, exam unremarkable. Will repeat in one year.

## 2018-05-12 NOTE — Progress Notes (Signed)
Subjective:    Patient ID: Cody Pearson, male    DOB: 1951-07-04, 67 y.o.   MRN: [id number]  HPI  Cody Pearson is a 67 year old male who presents today for repeat ECG.   He was last evaluated on 03/26/18 for his Welcome to Medicare Visit and was noted to have ECG changes in V2 with t-wave inversion. This was not evident in his ECG from February 2019. During his last visit he was asymptomatic.   Since his last visit he's doing well. He denies chest pain, dizziness, shortness of breath.   BP Readings from Last 3 Encounters:  05/12/18 114/68  03/26/18 104/70  03/10/18 90/66     Review of Systems  Constitutional: Negative for fatigue.  Eyes: Negative for visual disturbance.  Respiratory: Negative for shortness of breath.   Cardiovascular: Negative for chest pain and palpitations.  Neurological: Negative for dizziness and headaches.       Past Medical History:  Diagnosis Date  . Arthritis   . Cancer ([place])    melanoma  . History of colonic polyps      Social History   Socioeconomic History  . Marital status: Widowed    Spouse name: Not on file  . Number of children: 3  . Years of education: Not on file  . Highest education level: Not on file  Occupational History  . Occupation: Maintenance    Employer: [employer]   Social Needs  . Financial resource strain: Not on file  . Food insecurity:    Worry: Not on file    Inability: Not on file  . Transportation needs:    Medical: Not on file    Non-medical: Not on file  Tobacco Use  . Smoking status: Never Smoker  . Smokeless tobacco: Never Used  Substance and Sexual Activity  . Alcohol use: No  . Drug use: No  . Sexual activity: Not on file  Lifestyle  . Physical activity:    Days per week: Not on file    Minutes per session: Not on file  . Stress: Not on file  Relationships  . Social connections:    Talks on phone: Not on file    Gets together: Not on file    Attends religious service: Not on file   Active member of club or organization: Not on file    Attends meetings of clubs or organizations: Not on file    Relationship status: Not on file  . Intimate partner violence:    Fear of current or ex partner: Not on file    Emotionally abused: Not on file    Physically abused: Not on file    Forced sexual activity: Not on file  Other Topics Concern  . 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   Highest level of education 12th grade.   Married.   Lives locally.    Past Surgical History:  Procedure Laterality Date  . KNEE ARTHROSCOPY Right   . PARTIAL KNEE ARTHROPLASTY Right 10/13/2017   Procedure: UNICOMPARTMENTAL KNEE;  Surgeon: Corky Mull, MD;  Location: [facility] ORS;  Service: Orthopedics;  Laterality: Right;    Family History  Problem Relation Age of Onset  . Aneurysm Paternal Grandmother     Allergies  Allergen Reactions  . Amoxicillin Nausea Only    Nausea, anorexia, body aches    Current Outpatient Medications on File Prior to Visit  Medication Sig Dispense Refill  .  acetaminophen (TYLENOL 8 HOUR) 650 MG CR tablet Take 650 mg by mouth every 8 (eight) hours as needed for pain.    . clindamycin (CLEOCIN) 300 MG capsule 2 caps po 1 hour before dental procedures 2 capsule 3  . Multiple Vitamins-Minerals (CENTRUM SILVER PO) Take 1 tablet by mouth daily.    . naproxen sodium (ANAPROX) 220 MG tablet Take 440 mg by mouth 2 (two) times daily.      No current facility-administered medications on file prior to visit.     BP 114/68   Pulse 78   Temp 98.2 F (36.8 C) (Oral)   Ht 5\' 9"  (1.753 m)   Wt 194 lb 12 oz (88.3 kg)   SpO2 97%   BMI 28.76 kg/m    Objective:   Physical Exam  Constitutional: He appears well-nourished.  Neck: Neck supple.  Cardiovascular: Normal rate and regular rhythm.  Respiratory: Effort normal and breath sounds normal.  Skin: Skin is warm and dry.           Assessment & Plan:

## 2018-05-12 NOTE — Patient Instructions (Signed)
Your ECG is the same as it was 6 weeks ago. We will repeat this in one year.  Please notify me if you develop chest pain, dizziness, exertional shortness of breath.  It was a pleasure to see you today!

## 2018-06-14 ENCOUNTER — Telehealth: Payer: Self-pay

## 2018-06-14 DIAGNOSIS — N529 Male erectile dysfunction, unspecified: Secondary | ICD-10-CM

## 2018-06-14 NOTE — Telephone Encounter (Signed)
Please ask patient: Is he having difficulty obtaining or maintaining an erection? I can send in the generic of Viagra called sildenafil. It's 2-5 tablets by mouth 30 minutes prior to intercourse. I recommend starting with two.

## 2018-06-14 NOTE — Telephone Encounter (Signed)
Pt left v/m requesting cb; pt is requesting viagra. Pt has never taken viagra but wanted to try it.pt had annual exam on 03/26/18. [facility] [address] Does pt need to be seen?

## 2018-06-15 DIAGNOSIS — N529 Male erectile dysfunction, unspecified: Secondary | ICD-10-CM | POA: Insufficient documentation

## 2018-06-15 MED ORDER — SILDENAFIL CITRATE 20 MG PO TABS: ORAL_TABLET | ORAL | 0 refills | Status: DC

## 2018-06-15 NOTE — Telephone Encounter (Signed)
Patient has been notified

## 2018-06-15 NOTE — Assessment & Plan Note (Signed)
Endorses difficulty obtaining and maintaining erection. Agree to trial of sildenafil 20 mg, 2-5 tablets prior to intercourse. He has briefly mentioned this during prior visits but wasn't interested in treatment. He will update.

## 2018-06-15 NOTE — Telephone Encounter (Signed)
Noted, Rx sent to pharmacy. 

## 2018-06-15 NOTE — Telephone Encounter (Signed)
Spoke to pt and advised per [facility]. States that he is having issues, obtaining and keeping erection. States he is agreeable to generic

## 2018-07-07 ENCOUNTER — Encounter: Payer: Self-pay | Admitting: Primary Care

## 2018-07-07 ENCOUNTER — Ambulatory Visit (INDEPENDENT_AMBULATORY_CARE_PROVIDER_SITE_OTHER): Payer: Commercial Managed Care - PPO | Admitting: Primary Care

## 2018-07-07 VITALS — BP 120/76 | HR 62 | Temp 98.0°F | Ht 69.0 in | Wt 194.0 lb

## 2018-07-07 DIAGNOSIS — R7303 Prediabetes: Secondary | ICD-10-CM

## 2018-07-07 DIAGNOSIS — N401 Enlarged prostate with lower urinary tract symptoms: Secondary | ICD-10-CM | POA: Diagnosis not present

## 2018-07-07 DIAGNOSIS — R3912 Poor urinary stream: Secondary | ICD-10-CM

## 2018-07-07 DIAGNOSIS — N529 Male erectile dysfunction, unspecified: Secondary | ICD-10-CM | POA: Diagnosis not present

## 2018-07-07 LAB — HEMOGLOBIN A1C: Hgb A1c MFr Bld: 5.7 % (ref 4.6–6.5)

## 2018-07-07 LAB — BASIC METABOLIC PANEL
BUN: 15 mg/dL (ref 6–23)
CALCIUM: 9 mg/dL (ref 8.4–10.5)
CO2: 30 meq/L (ref 19–32)
Chloride: 104 mEq/L (ref 96–112)
Creatinine, Ser: 1.16 mg/dL (ref 0.40–1.50)
GFR: 66.96 mL/min (ref >60.00)
Glucose, Bld: 97 mg/dL (ref 70–99)
POTASSIUM: 4.3 meq/L (ref 3.5–5.1)
SODIUM: 139 meq/L (ref 135–145)

## 2018-07-07 LAB — TSH: TSH: 3.41 u[IU]/mL (ref 0.35–4.50)

## 2018-07-07 MED ORDER — TAMSULOSIN HCL 0.4 MG PO CAPS: ORAL_CAPSULE | ORAL | 0 refills | Status: DC

## 2018-07-07 NOTE — Progress Notes (Signed)
Subjective:    Patient ID: Cody Pearson, male    DOB: Sep 24, 1950, 67 y.o.   MRN: [id number]  HPI  Cody Pearson is a 67 year old male with a history of prediabetes and erectile dysfunction who presents today with several complaints.   He continues to experience difficulty obtaining and maintaining an erection. He doesn't experience ejaculation. This began several years ago with gradual decrease in function over the last 2 months. He denies swelling, pain to the testicles or penis, dysuria, hematuria, fevers. He does notice a slow urinary stream intermittently, decreased feeling of needing to urinate, occasional incomplete bladder emptying, intermittent fatigue.     Review of Systems  Cardiovascular: Negative for chest pain.  Gastrointestinal: Negative for abdominal pain.  Genitourinary: Positive for decreased urine volume and difficulty urinating. Negative for discharge, dysuria, frequency, hematuria, penile pain, penile swelling, scrotal swelling and testicular pain.       Difficulty obtaining and maintaining erections.        Past Medical History:  Diagnosis Date  . Arthritis   . Cancer ([place])    melanoma  . History of colonic polyps      Social History   Socioeconomic History  . Marital status: Widowed    Spouse name: Not on file  . Number of children: 3  . Years of education: Not on file  . Highest education level: Not on file  Occupational History  . Occupation: Maintenance    Employer: [employer]   Social Needs  . Financial resource strain: Not on file  . Food insecurity:    Worry: Not on file    Inability: Not on file  . Transportation needs:    Medical: Not on file    Non-medical: Not on file  Tobacco Use  . Smoking status: Never Smoker  . Smokeless tobacco: Never Used  Substance and Sexual Activity  . Alcohol use: No  . Drug use: No  . Sexual activity: Not on file  Lifestyle  . Physical activity:    Days per week: Not on file    Minutes per  session: Not on file  . Stress: Not on file  Relationships  . Social connections:    Talks on phone: Not on file    Gets together: Not on file    Attends religious service: Not on file    Active member of club or organization: Not on file    Attends meetings of clubs or organizations: Not on file    Relationship status: Not on file  . Intimate partner violence:    Fear of current or ex partner: Not on file    Emotionally abused: Not on file    Physically abused: Not on file    Forced sexual activity: Not on file  Other Topics Concern  . 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   Highest level of education 12th grade.   Married.   Lives locally.    Past Surgical History:  Procedure Laterality Date  . KNEE ARTHROSCOPY Right   . PARTIAL KNEE ARTHROPLASTY Right 10/13/2017   Procedure: UNICOMPARTMENTAL KNEE;  Surgeon: Corky Mull, MD;  Location: [facility] ORS;  Service: Orthopedics;  Laterality: Right;    Family History  Problem Relation Age of Onset  . Aneurysm Paternal Grandmother     Allergies  Allergen Reactions  . Amoxicillin Nausea Only    Nausea, anorexia, body aches    Current Outpatient  Medications on File Prior to Visit  Medication Sig Dispense Refill  . acetaminophen (TYLENOL 8 HOUR) 650 MG CR tablet Take 650 mg by mouth every 8 (eight) hours as needed for pain.    . clindamycin (CLEOCIN) 300 MG capsule 2 caps po 1 hour before dental procedures 2 capsule 3  . Multiple Vitamins-Minerals (CENTRUM SILVER PO) Take 1 tablet by mouth daily.    . naproxen sodium (ANAPROX) 220 MG tablet Take 440 mg by mouth 2 (two) times daily.     . sildenafil (REVATIO) 20 MG tablet Take 2-5 tablets 30 minutes prior to intercourse. 30 tablet 0   No current facility-administered medications on file prior to visit.     BP 120/76   Pulse 62   Temp 98 F (36.7 C) (Oral)   Ht 5\' 9"  (1.753 m)   Wt 194 lb (88 kg)   SpO2 96%   BMI  28.65 kg/m    Objective:   Physical Exam  Constitutional: He appears well-nourished.  Cardiovascular: Normal rate.  Respiratory: Effort normal.  Genitourinary: Penis normal. Prostate is enlarged. Prostate is not tender. Right testis shows no swelling. Left testis shows no swelling.  Genitourinary Comments: Mildly enlarged prostate noted on exam. Chaperone present.   Skin: Skin is warm and dry.           Assessment & Plan:

## 2018-07-07 NOTE — Assessment & Plan Note (Signed)
Testosterone level and A1C pending. Exam otherwise unremarkable today.

## 2018-07-07 NOTE — Patient Instructions (Addendum)
Stop by the lab prior to leaving today. I will notify you of your results once received.   Start tamsulosin 0.4 mg for enlarged prostate. Take 1 capsule by mouth 30 minutes before breakfast daily.  Please call me with an update in 2-3 weeks.  It was a pleasure to see you today!

## 2018-07-07 NOTE — Assessment & Plan Note (Signed)
Mildly enlarged prostate without masses noted today. PSA in August unremarkable. Will trial tamsulosin 0.4 mg daily and have him update in 2-3 weeks.

## 2018-07-12 ENCOUNTER — Encounter: Payer: Self-pay | Admitting: *Deleted

## 2018-07-12 ENCOUNTER — Telehealth: Payer: Self-pay | Admitting: *Deleted

## 2018-07-12 DIAGNOSIS — Z85828 Personal history of other malignant neoplasm of skin: Secondary | ICD-10-CM | POA: Diagnosis not present

## 2018-07-12 DIAGNOSIS — D485 Neoplasm of uncertain behavior of skin: Secondary | ICD-10-CM | POA: Diagnosis not present

## 2018-07-12 DIAGNOSIS — D2261 Melanocytic nevi of right upper limb, including shoulder: Secondary | ICD-10-CM | POA: Diagnosis not present

## 2018-07-12 DIAGNOSIS — Z85820 Personal history of malignant melanoma of skin: Secondary | ICD-10-CM | POA: Diagnosis not present

## 2018-07-12 DIAGNOSIS — D2262 Melanocytic nevi of left upper limb, including shoulder: Secondary | ICD-10-CM | POA: Diagnosis not present

## 2018-07-12 DIAGNOSIS — Z08 Encounter for follow-up examination after completed treatment for malignant neoplasm: Secondary | ICD-10-CM | POA: Diagnosis not present

## 2018-07-12 DIAGNOSIS — L821 Other seborrheic keratosis: Secondary | ICD-10-CM | POA: Diagnosis not present

## 2018-07-12 DIAGNOSIS — D2272 Melanocytic nevi of left lower limb, including hip: Secondary | ICD-10-CM | POA: Diagnosis not present

## 2018-07-12 DIAGNOSIS — L820 Inflamed seborrheic keratosis: Secondary | ICD-10-CM | POA: Diagnosis not present

## 2018-07-12 DIAGNOSIS — D2271 Melanocytic nevi of right lower limb, including hip: Secondary | ICD-10-CM | POA: Diagnosis not present

## 2018-07-12 LAB — TESTOS,TOTAL,FREE AND SHBG (FEMALE)
FREE TESTOSTERONE: 46.2 pg/mL (ref 35.0–155.0)
SEX HORMONE BINDING: 24 nmol/L (ref 22–77)
Testosterone, Total, LC-MS-MS: 328 ng/dL (ref 250–1100)

## 2018-07-12 NOTE — Telephone Encounter (Signed)
Message left for patient to return my call.  

## 2018-07-12 NOTE — Telephone Encounter (Signed)
-----   Message from Pleas Koch, NP sent at 07/12/2018  8:11 AM EST ----- Please notify patient:  Kidneys, thyroid function are normal. Testosterone level is normal.  Blood sugar is in the prediabetic range but no worse than before.   How's he doing since we started the tamsulosin (Flomax)?

## 2018-07-13 NOTE — Telephone Encounter (Signed)
[facility] faxed note pt returned call; on call provider advised that labs were normal and pt is to cb to office about FU on Flomax.

## 2018-07-13 NOTE — Telephone Encounter (Signed)
Message left for patient to return my call.  

## 2018-07-14 NOTE — Telephone Encounter (Signed)
Pt returned chan's call. Pt says to contact him at [phone]

## 2018-07-14 NOTE — Telephone Encounter (Signed)
Addressed in results note

## 2018-07-23 ENCOUNTER — Telehealth: Payer: Self-pay | Admitting: Primary Care

## 2018-07-23 DIAGNOSIS — N529 Male erectile dysfunction, unspecified: Secondary | ICD-10-CM

## 2018-07-23 MED ORDER — SILDENAFIL CITRATE 20 MG PO TABS: ORAL_TABLET | ORAL | 0 refills | Status: DC

## 2018-07-23 NOTE — Telephone Encounter (Signed)
Pt called requesting a refill on the sildenafil. Pt uses [facility] representative, [facility] [employer].

## 2018-07-23 NOTE — Telephone Encounter (Signed)
Noted.  Refill sent to pharmacy. 

## 2018-07-23 NOTE — Addendum Note (Signed)
Addended by: Pleas Koch on: 07/23/2018 12:48 PM   Modules accepted: Orders

## 2018-07-26 ENCOUNTER — Other Ambulatory Visit: Payer: Self-pay | Admitting: Primary Care

## 2018-07-26 DIAGNOSIS — N401 Enlarged prostate with lower urinary tract symptoms: Secondary | ICD-10-CM

## 2018-07-26 DIAGNOSIS — R3912 Poor urinary stream: Principal | ICD-10-CM

## 2018-07-30 ENCOUNTER — Telehealth: Payer: Self-pay | Admitting: Primary Care

## 2018-07-30 DIAGNOSIS — R3912 Poor urinary stream: Principal | ICD-10-CM

## 2018-07-30 DIAGNOSIS — N401 Enlarged prostate with lower urinary tract symptoms: Secondary | ICD-10-CM

## 2018-07-30 MED ORDER — TAMSULOSIN HCL 0.4 MG PO CAPS: ORAL_CAPSULE | ORAL | 1 refills | Status: DC

## 2018-07-30 NOTE — Telephone Encounter (Signed)
Pt stated he was going out of town and need to get his tamsulosin early. Please advise pt.

## 2018-07-30 NOTE — Telephone Encounter (Signed)
Noted.  Refill sent to pharmacy. 

## 2018-07-30 NOTE — Telephone Encounter (Signed)
Noted, how's he doing since we started the tamsulosin? Any improvement in symptoms?

## 2018-07-30 NOTE — Telephone Encounter (Signed)
Spoke with pt and he states that his symptoms have improved.

## 2018-08-16 ENCOUNTER — Other Ambulatory Visit: Payer: Self-pay | Admitting: Primary Care

## 2018-08-16 DIAGNOSIS — N529 Male erectile dysfunction, unspecified: Secondary | ICD-10-CM

## 2018-08-16 NOTE — Telephone Encounter (Signed)
Last prescribed on 07/23/2018 #50  with 0 refills. Last office visit on 07/07/2018. No future appointment

## 2018-08-16 NOTE — Telephone Encounter (Signed)
Pt called requesting a refill on the Sildenafil. Pt uses [facility] on [address]

## 2018-08-16 NOTE — Telephone Encounter (Signed)
Message left for patient to return my call.  

## 2018-08-16 NOTE — Telephone Encounter (Signed)
Please notify patient that refilled for 50 tablets on 07/23/18. How often is he taking this medication? How many pills is he taking at a time? This medication isn't intended for daily use.

## 2018-08-17 MED ORDER — SILDENAFIL CITRATE 20 MG PO TABS: ORAL_TABLET | ORAL | 0 refills | Status: DC

## 2018-08-17 NOTE — Telephone Encounter (Signed)
Noted.  Refill sent to pharmacy. 

## 2018-08-17 NOTE — Telephone Encounter (Signed)
Patient called back. He stated that he takes 4 tablets every 3 days. I did inform that this medication is not for daily use. Patient verbalized understand and ask for a refill.

## 2018-08-26 DIAGNOSIS — D485 Neoplasm of uncertain behavior of skin: Secondary | ICD-10-CM | POA: Diagnosis not present

## 2018-08-26 DIAGNOSIS — C44219 Basal cell carcinoma of skin of left ear and external auricular canal: Secondary | ICD-10-CM | POA: Diagnosis not present

## 2018-08-30 ENCOUNTER — Telehealth: Payer: Self-pay | Admitting: Primary Care

## 2018-08-30 NOTE — Telephone Encounter (Signed)
Please notify patient that he cannot take two capsules. Please schedule him for an appointment if he has continued symptoms.

## 2018-08-30 NOTE — Telephone Encounter (Signed)
Pt is taking tamsulosin and want to know is he can take 2 pills because the 1 pill isn't working. Please advise pt.

## 2018-08-31 NOTE — Telephone Encounter (Signed)
Noted  

## 2018-08-31 NOTE — Telephone Encounter (Signed)
Patient stated that not at this time.

## 2018-08-31 NOTE — Telephone Encounter (Signed)
Spoken to patient and he stated that he will stand with just 1 pill. He does not want to come in to be seen for now.

## 2018-08-31 NOTE — Telephone Encounter (Signed)
We can send him to Urology for further evaluation, would he prefer that?

## 2018-09-07 ENCOUNTER — Telehealth: Payer: Self-pay | Admitting: Primary Care

## 2018-09-07 DIAGNOSIS — N529 Male erectile dysfunction, unspecified: Secondary | ICD-10-CM

## 2018-09-07 NOTE — Telephone Encounter (Signed)
It appears that 50 tablets were sent on August 17, 2018. How often is he having to take this medication?  How many tablets at a time is he taking? He should not be taking this medication every day.

## 2018-09-07 NOTE — Telephone Encounter (Signed)
Pt need refill for   Sildenafil 20mg   Sent to [facility]

## 2018-09-07 NOTE — Telephone Encounter (Signed)
Last prescribed on 07/23/2018. Last office visit on 07/07/2018. No future appointment

## 2018-09-08 NOTE — Telephone Encounter (Signed)
Pt returned call to Chan. °

## 2018-09-08 NOTE — Telephone Encounter (Signed)
Message left for patient to return my call.  

## 2018-09-09 MED ORDER — SILDENAFIL CITRATE 20 MG PO TABS: ORAL_TABLET | ORAL | 0 refills | Status: DC

## 2018-09-09 NOTE — Telephone Encounter (Signed)
Pt returning your call

## 2018-09-09 NOTE — Telephone Encounter (Signed)
Message left for patient to return my call.  

## 2018-09-09 NOTE — Telephone Encounter (Signed)
Spoken and notified patient of Tawni Millers comments. Patient stated that he has been taking 5 tablets every 3 days. I did offer referral to urologist but he decline. He stated that he will use less but would still like a refill.

## 2018-09-09 NOTE — Telephone Encounter (Signed)
Noted, refill sent to pharmacy. 

## 2018-09-22 ENCOUNTER — Ambulatory Visit: Payer: Commercial Managed Care - PPO | Admitting: Primary Care

## 2018-09-23 DIAGNOSIS — C44219 Basal cell carcinoma of skin of left ear and external auricular canal: Secondary | ICD-10-CM | POA: Diagnosis not present

## 2018-09-23 DIAGNOSIS — C4441 Basal cell carcinoma of skin of scalp and neck: Secondary | ICD-10-CM | POA: Diagnosis not present

## 2018-09-27 ENCOUNTER — Ambulatory Visit (INDEPENDENT_AMBULATORY_CARE_PROVIDER_SITE_OTHER): Payer: Commercial Managed Care - PPO | Admitting: Primary Care

## 2018-09-27 ENCOUNTER — Encounter: Payer: Self-pay | Admitting: Primary Care

## 2018-09-27 VITALS — BP 120/74 | HR 71 | Temp 98.2°F | Ht 69.0 in | Wt 203.5 lb

## 2018-09-27 DIAGNOSIS — R3912 Poor urinary stream: Secondary | ICD-10-CM | POA: Diagnosis not present

## 2018-09-27 DIAGNOSIS — N401 Enlarged prostate with lower urinary tract symptoms: Secondary | ICD-10-CM | POA: Diagnosis not present

## 2018-09-27 DIAGNOSIS — R7303 Prediabetes: Secondary | ICD-10-CM | POA: Diagnosis not present

## 2018-09-27 DIAGNOSIS — N529 Male erectile dysfunction, unspecified: Secondary | ICD-10-CM | POA: Diagnosis not present

## 2018-09-27 LAB — BASIC METABOLIC PANEL
BUN: 18 mg/dL (ref 6–23)
CO2: 31 mEq/L (ref 19–32)
Calcium: 9.2 mg/dL (ref 8.4–10.5)
Chloride: 105 mEq/L (ref 96–112)
Creatinine, Ser: 1.17 mg/dL (ref 0.40–1.50)
GFR: 62.34 mL/min (ref >60.00)
Glucose, Bld: 94 mg/dL (ref 70–99)
Potassium: 4.8 mEq/L (ref 3.5–5.1)
Sodium: 140 mEq/L (ref 135–145)

## 2018-09-27 LAB — HEMOGLOBIN A1C: Hgb A1c MFr Bld: 5.7 % (ref 4.6–6.5)

## 2018-09-27 NOTE — Assessment & Plan Note (Signed)
Continues, also with ejalucation difficulty.  Check labs today including PSA, TSH, A1C. Referral placed to Urology for further evaluation.

## 2018-09-27 NOTE — Assessment & Plan Note (Signed)
Compliant to Tamsulosin, overall asymptomatic.  Referral placed to Urology. PSA pending.

## 2018-09-27 NOTE — Patient Instructions (Signed)
Stop by the lab prior to leaving today. I will notify you of your results once received.   You will be contacted regarding your referral to Urology.  Please let us know if you have not been contacted within one week.   It was a pleasure to see you today!

## 2018-09-27 NOTE — Progress Notes (Signed)
Subjective:    Patient ID: Cody Pearson, male    DOB: 1951-03-07, 68 y.o.   MRN: [id number]  HPI  Mr. Theil is a 68 year old male with a history of BPH, prediabetes, erectile dysfunction who presents today to discuss Urology referral.  He is currently managed on sildenafil 20 mg for which he is taking 100 mg three to four times weekly. Total Testosterone level of 328 in November 2019. He would like to see Urology due to difficulty obtaining an erection and ejaculation issues.   Also managed on tamsulosin 0.4 mg daily for BPH. Last PSA was completed in August 2019 with level of 0.91. He continues to notice a slower urinary stream in the morning which has been chronic since his youth. His stream is improved and stronger throughout the day. He will wake once during the night on average to urinate, sometimes not at all. He will occasionally having incomplete bladder emptying.   He denies hematuria, dysuria, unexplained weight loss, penile discharge, penile pain.  Review of Systems  Constitutional: Negative for unexpected weight change.  Respiratory: Negative for shortness of breath.   Cardiovascular: Negative for chest pain.  Genitourinary: Negative for decreased urine volume, difficulty urinating, discharge, frequency, hematuria and penile pain.       Slower stream in mornings. Ejaculatory problem.        Past Medical History:  Diagnosis Date  . Arthritis   . Cancer ([place])    melanoma  . History of colonic polyps      Social History   Socioeconomic History  . Marital status: Widowed    Spouse name: Not on file  . Number of children: 3  . Years of education: Not on file  . Highest education level: Not on file  Occupational History  . Occupation: Maintenance    Employer: [employer]   Social Needs  . Financial resource strain: Not on file  . Food insecurity:    Worry: Not on file    Inability: Not on file  . Transportation needs:    Medical: Not on file   Non-medical: Not on file  Tobacco Use  . Smoking status: Never Smoker  . Smokeless tobacco: Never Used  Substance and Sexual Activity  . Alcohol use: No  . Drug use: No  . Sexual activity: Not on file  Lifestyle  . Physical activity:    Days per week: Not on file    Minutes per session: Not on file  . Stress: Not on file  Relationships  . Social connections:    Talks on phone: Not on file    Gets together: Not on file    Attends religious service: Not on file    Active member of club or organization: Not on file    Attends meetings of clubs or organizations: Not on file    Relationship status: Not on file  . Intimate partner violence:    Fear of current or ex partner: Not on file    Emotionally abused: Not on file    Physically abused: Not on file    Forced sexual activity: Not on file  Other Topics Concern  . 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   Highest level of education 12th grade.   Married.   Lives locally.    Past Surgical History:  Procedure Laterality Date  . KNEE ARTHROSCOPY Right   . PARTIAL KNEE ARTHROPLASTY Right 10/13/2017  Procedure: UNICOMPARTMENTAL KNEE;  Surgeon: Corky Mull, MD;  Location: [facility] ORS;  Service: Orthopedics;  Laterality: Right;    Family History  Problem Relation Age of Onset  . Aneurysm Paternal Grandmother     Allergies  Allergen Reactions  . Amoxicillin Nausea Only    Nausea, anorexia, body aches    Current Outpatient Medications on File Prior to Visit  Medication Sig Dispense Refill  . acetaminophen (TYLENOL 8 HOUR) 650 MG CR tablet Take 650 mg by mouth every 8 (eight) hours as needed for pain.    . clindamycin (CLEOCIN) 300 MG capsule 2 caps po 1 hour before dental procedures 2 capsule 3  . Multiple Vitamins-Minerals (CENTRUM SILVER PO) Take 1 tablet by mouth daily.    . naproxen sodium (ANAPROX) 220 MG tablet Take 440 mg by mouth 2 (two) times daily.     .  sildenafil (REVATIO) 20 MG tablet Take 2-5 tablets 30 minutes prior to intercourse. 50 tablet 0  . tamsulosin (FLOMAX) 0.4 MG CAPS capsule Take 1 capsule by mouth 30 minutes before breakfast daily for prostate. 90 capsule 1   No current facility-administered medications on file prior to visit.     BP 120/74   Pulse 71   Temp 98.2 F (36.8 C) (Oral)   Ht 5\' 9"  (1.753 m)   Wt 203 lb 8 oz (92.3 kg)   SpO2 96%   BMI 30.05 kg/m    Objective:   Physical Exam  Constitutional: He appears well-nourished.  Neck: Neck supple.  Cardiovascular: Normal rate and regular rhythm.  Respiratory: Effort normal and breath sounds normal.  Skin: Skin is warm and dry.           Assessment & Plan:

## 2018-09-27 NOTE — Assessment & Plan Note (Signed)
Overall stable. Repeat A1C pending.

## 2018-09-28 ENCOUNTER — Other Ambulatory Visit: Payer: Self-pay | Admitting: Primary Care

## 2018-09-28 DIAGNOSIS — R7989 Other specified abnormal findings of blood chemistry: Secondary | ICD-10-CM

## 2018-09-28 LAB — PSA: PSA: 0.97 ng/mL (ref 0.10–4.00)

## 2018-09-28 LAB — TSH: TSH: 6.06 u[IU]/mL — ABNORMAL HIGH (ref 0.35–4.50)

## 2018-10-04 ENCOUNTER — Telehealth: Payer: Self-pay | Admitting: Primary Care

## 2018-10-04 DIAGNOSIS — N529 Male erectile dysfunction, unspecified: Secondary | ICD-10-CM

## 2018-10-04 MED ORDER — SILDENAFIL CITRATE 20 MG PO TABS: ORAL_TABLET | ORAL | 0 refills | Status: DC

## 2018-10-04 NOTE — Telephone Encounter (Signed)
Noted, refill sent to pharmacy. Patient to establish with Urology.

## 2018-10-04 NOTE — Telephone Encounter (Signed)
Last prescribed on 09/09/2018. Last office visit on 09/27/2018. Next future appointment on 10/20/2018

## 2018-10-04 NOTE — Telephone Encounter (Signed)
Patient uses [facility].  Patient needs a refill on Sildenafil 20 mg.  Patient said he's out of refills.

## 2018-10-05 ENCOUNTER — Ambulatory Visit: Payer: Commercial Managed Care - PPO | Admitting: Primary Care

## 2018-10-07 DIAGNOSIS — L570 Actinic keratosis: Secondary | ICD-10-CM | POA: Diagnosis not present

## 2018-10-20 ENCOUNTER — Ambulatory Visit: Payer: Commercial Managed Care - PPO | Admitting: Primary Care

## 2018-10-22 ENCOUNTER — Ambulatory Visit: Payer: Self-pay | Admitting: Urology

## 2018-10-27 ENCOUNTER — Ambulatory Visit: Payer: Commercial Managed Care - PPO | Admitting: Primary Care

## 2018-11-01 ENCOUNTER — Ambulatory Visit: Payer: Self-pay | Admitting: Urology

## 2018-11-11 ENCOUNTER — Other Ambulatory Visit: Payer: Self-pay | Admitting: Primary Care

## 2018-11-11 DIAGNOSIS — N529 Male erectile dysfunction, unspecified: Secondary | ICD-10-CM

## 2018-11-11 NOTE — Telephone Encounter (Signed)
Last prescribed on 10/04/2018. Last office visit on 09/27/2018. No future appointment

## 2018-11-11 NOTE — Telephone Encounter (Signed)
Did he ever established with urology? If so are there any recommendations regarding his sildenafil? If he has not then when is his appointment?

## 2018-11-12 NOTE — Telephone Encounter (Signed)
Noted, refill sent to pharmacy. 

## 2018-11-12 NOTE — Telephone Encounter (Signed)
He has been moved to 02/09/2019

## 2018-11-15 ENCOUNTER — Ambulatory Visit: Payer: Self-pay | Admitting: Urology

## 2018-11-30 DIAGNOSIS — L738 Other specified follicular disorders: Secondary | ICD-10-CM | POA: Diagnosis not present

## 2018-11-30 DIAGNOSIS — Z85828 Personal history of other malignant neoplasm of skin: Secondary | ICD-10-CM | POA: Diagnosis not present

## 2018-11-30 DIAGNOSIS — D2230 Melanocytic nevi of unspecified part of face: Secondary | ICD-10-CM | POA: Diagnosis not present

## 2018-11-30 DIAGNOSIS — D224 Melanocytic nevi of scalp and neck: Secondary | ICD-10-CM | POA: Diagnosis not present

## 2018-11-30 DIAGNOSIS — L821 Other seborrheic keratosis: Secondary | ICD-10-CM | POA: Diagnosis not present

## 2018-11-30 DIAGNOSIS — L820 Inflamed seborrheic keratosis: Secondary | ICD-10-CM | POA: Diagnosis not present

## 2018-11-30 DIAGNOSIS — Z85820 Personal history of malignant melanoma of skin: Secondary | ICD-10-CM | POA: Diagnosis not present

## 2018-12-08 ENCOUNTER — Telehealth: Payer: Self-pay | Admitting: Primary Care

## 2018-12-08 DIAGNOSIS — N529 Male erectile dysfunction, unspecified: Secondary | ICD-10-CM

## 2018-12-13 MED ORDER — SILDENAFIL CITRATE 20 MG PO TABS: ORAL_TABLET | ORAL | 0 refills | Status: DC

## 2018-12-13 NOTE — Addendum Note (Signed)
Addended by: Pleas Koch on: 12/13/2018 11:22 AM   Modules accepted: Orders

## 2018-12-13 NOTE — Telephone Encounter (Signed)
Noted, refill sent to pharmacy. 

## 2018-12-13 NOTE — Telephone Encounter (Signed)
Spoken to patient and he stated that he does need a refill. He is taking 5 tablets as needed. He has an urology appointment on 02/09/2019. He stated that they kept rescheduling his new patient appointment.

## 2018-12-13 NOTE — Telephone Encounter (Signed)
Pt called concerning refill. He is requesting a call back.

## 2018-12-14 ENCOUNTER — Telehealth: Payer: Self-pay | Admitting: Primary Care

## 2018-12-14 NOTE — Telephone Encounter (Signed)
Copied from [place] [id number]. Topic: Quick Communication - See Telephone Encounter >> Dec 13, 2018  5:00 PM Blase Mess A wrote: CRM for notification. See Telephone encounter for: 12/13/18.  Patient states he missed a call today. [phone] (M)

## 2019-01-12 ENCOUNTER — Other Ambulatory Visit: Payer: Self-pay | Admitting: Primary Care

## 2019-01-12 DIAGNOSIS — N529 Male erectile dysfunction, unspecified: Secondary | ICD-10-CM

## 2019-01-31 DIAGNOSIS — E291 Testicular hypofunction: Secondary | ICD-10-CM | POA: Diagnosis not present

## 2019-01-31 DIAGNOSIS — Z125 Encounter for screening for malignant neoplasm of prostate: Secondary | ICD-10-CM | POA: Diagnosis not present

## 2019-01-31 DIAGNOSIS — N401 Enlarged prostate with lower urinary tract symptoms: Secondary | ICD-10-CM | POA: Diagnosis not present

## 2019-01-31 DIAGNOSIS — N500 Atrophy of testis: Secondary | ICD-10-CM | POA: Diagnosis not present

## 2019-02-09 ENCOUNTER — Ambulatory Visit: Payer: Self-pay | Admitting: Urology

## 2019-02-16 DIAGNOSIS — N5201 Erectile dysfunction due to arterial insufficiency: Secondary | ICD-10-CM | POA: Diagnosis not present

## 2019-02-16 DIAGNOSIS — E291 Testicular hypofunction: Secondary | ICD-10-CM | POA: Diagnosis not present

## 2019-02-16 DIAGNOSIS — Z79899 Other long term (current) drug therapy: Secondary | ICD-10-CM | POA: Diagnosis not present

## 2019-02-16 DIAGNOSIS — N401 Enlarged prostate with lower urinary tract symptoms: Secondary | ICD-10-CM | POA: Diagnosis not present

## 2019-03-15 ENCOUNTER — Ambulatory Visit: Payer: Self-pay | Admitting: Urology

## 2019-04-04 ENCOUNTER — Encounter: Payer: Self-pay | Admitting: Primary Care

## 2019-04-04 ENCOUNTER — Ambulatory Visit (INDEPENDENT_AMBULATORY_CARE_PROVIDER_SITE_OTHER): Payer: Commercial Managed Care - PPO | Admitting: Primary Care

## 2019-04-04 ENCOUNTER — Other Ambulatory Visit: Payer: Self-pay

## 2019-04-04 VITALS — BP 120/82 | HR 72 | Temp 98.2°F | Ht 69.0 in | Wt 204.8 lb

## 2019-04-04 DIAGNOSIS — R9431 Abnormal electrocardiogram [ECG] [EKG]: Secondary | ICD-10-CM

## 2019-04-04 DIAGNOSIS — Z1211 Encounter for screening for malignant neoplasm of colon: Secondary | ICD-10-CM | POA: Diagnosis not present

## 2019-04-04 DIAGNOSIS — R7303 Prediabetes: Secondary | ICD-10-CM

## 2019-04-04 DIAGNOSIS — Z23 Encounter for immunization: Secondary | ICD-10-CM

## 2019-04-04 DIAGNOSIS — R7989 Other specified abnormal findings of blood chemistry: Secondary | ICD-10-CM | POA: Insufficient documentation

## 2019-04-04 DIAGNOSIS — N529 Male erectile dysfunction, unspecified: Secondary | ICD-10-CM

## 2019-04-04 DIAGNOSIS — Z0000 Encounter for general adult medical examination without abnormal findings: Secondary | ICD-10-CM

## 2019-04-04 NOTE — Assessment & Plan Note (Signed)
Following with Urology, now on Cialis 5 mg daily, then sildenafil as needed. Also now on Flomax.  Continue current regimen.

## 2019-04-04 NOTE — Assessment & Plan Note (Signed)
Repeat A1C pending. Encouraged to limit sodas, watch portion sizes.

## 2019-04-04 NOTE — Progress Notes (Signed)
Subjective:    Patient ID: Cody Pearson, male    DOB: 10-10-1950, 68 y.o.   MRN: [id number]  HPI  Cody Pearson is a 68 year old male who presents today for complete physical.  Immunizations: -Tetanus: Completed in 2012 -Influenza: Will get at work -Pneumonia: Never completed, due -Shingles: Never completed. Declines  Diet: He endorses a fair diet. He eats home cooked meals for the most part. Desserts daily. Drinking soda, ussweet tea, water.  Exercise: He is active at work.   Eye exam: Completed in 2019 Dental exam: Completes semi-anally  Colonoscopy: Completed in 2015, due in 2017 and has not scheduled.  PSA: 0.97 in 2020 Hep C Screen: Negative in 2019  BP Readings from Last 3 Encounters:  04/04/19 120/82  09/27/18 120/74  07/07/18 120/76     Review of Systems  Constitutional: Negative for unexpected weight change.  HENT: Negative for rhinorrhea.   Respiratory: Negative for cough and shortness of breath.   Cardiovascular: Negative for chest pain.  Gastrointestinal: Negative for constipation and diarrhea.  Genitourinary: Negative for difficulty urinating.       Erectile dysfuncion  Musculoskeletal: Negative for arthralgias and myalgias.  Skin: Negative for rash.  Allergic/Immunologic: Negative for environmental allergies.  Neurological: Negative for dizziness, numbness and headaches.  Psychiatric/Behavioral: The patient is not nervous/anxious.        Past Medical History:  Diagnosis Date  . Arthritis   . Cancer ([place])    melanoma  . History of colonic polyps      Social History   Socioeconomic History  . Marital status: Widowed    Spouse name: Not on file  . Number of children: 3  . Years of education: Not on file  . Highest education level: Not on file  Occupational History  . Occupation: Maintenance    Employer: [employer]   Social Needs  . Financial resource strain: Not on file  . Food insecurity    Worry: Not on file    Inability: Not on  file  . Transportation needs    Medical: Not on file    Non-medical: Not on file  Tobacco Use  . Smoking status: Never Smoker  . Smokeless tobacco: Never Used  Substance and Sexual Activity  . Alcohol use: No  . Drug use: No  . Sexual activity: Not on file  Lifestyle  . Physical activity    Days per week: Not on file    Minutes per session: Not on file  . Stress: Not on file  Relationships  . Social Herbalist on phone: Not on file    Gets together: Not on file    Attends religious service: Not on file    Active member of club or organization: Not on file    Attends meetings of clubs or organizations: Not on file    Relationship status: Not on file  . Intimate partner violence    Fear of current or ex partner: Not on file    Emotionally abused: Not on file    Physically abused: Not on file    Forced sexual activity: Not on file  Other Topics Concern  . 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   Highest level of education 12th grade.   Married.   Lives locally.    Past Surgical History:  Procedure Laterality Date  . KNEE ARTHROSCOPY Right   . PARTIAL KNEE ARTHROPLASTY Right  10/13/2017   Procedure: UNICOMPARTMENTAL KNEE;  Surgeon: Corky Mull, MD;  Location: [facility] ORS;  Service: Orthopedics;  Laterality: Right;    Family History  Problem Relation Age of Onset  . Aneurysm Paternal Grandmother     Allergies  Allergen Reactions  . Amoxicillin Nausea Only    Nausea, anorexia, body aches    Current Outpatient Medications on File Prior to Visit  Medication Sig Dispense Refill  . acetaminophen (TYLENOL 8 HOUR) 650 MG CR tablet Take 650 mg by mouth every 8 (eight) hours as needed for pain.    . Multiple Vitamins-Minerals (CENTRUM SILVER PO) Take 1 tablet by mouth daily.    . naproxen sodium (ANAPROX) 220 MG tablet Take 440 mg by mouth 2 (two) times daily.     . sildenafil (VIAGRA) 50 MG tablet Take 50 mg  by mouth daily as needed.    . tadalafil (CIALIS) 5 MG tablet Take 5 mg by mouth daily.    . tamsulosin (FLOMAX) 0.4 MG CAPS capsule Take 1 capsule by mouth 30 minutes before breakfast daily for prostate. 90 capsule 1  . Testosterone 20 % CREA by Does not apply route.     No current facility-administered medications on file prior to visit.     BP 120/82   Pulse 72   Temp 98.2 F (36.8 C) (Temporal)   Ht 5\' 9"  (1.753 m)   Wt 204 lb 12 oz (92.9 kg)   SpO2 96%   BMI 30.24 kg/m    Objective:   Physical Exam  Constitutional: He is oriented to person, place, and time. He appears well-nourished.  HENT:  Right Ear: Tympanic membrane and ear canal normal.  Left Ear: Tympanic membrane and ear canal normal.  Mouth/Throat: Oropharynx is clear and moist.  Eyes: Pupils are equal, round, and reactive to light. EOM are normal.  Neck: Neck supple.  Cardiovascular: Normal rate and regular rhythm.  Respiratory: Effort normal and breath sounds normal.  GI: Soft. Bowel sounds are normal. There is no abdominal tenderness.  Musculoskeletal: Normal range of motion.  Neurological: He is alert and oriented to person, place, and time.  Skin: Skin is warm and dry.  Psychiatric: He has a normal mood and affect.           Assessment & Plan:

## 2019-04-04 NOTE — Assessment & Plan Note (Signed)
Will get influenza vaccination at work, Dietitian provided today. Declines Shingles vaccination. PSA UTD. Colonoscopy due, referral placed. Encouraged a healthy diet and regular exercise. Exam today unremarkable. Labs pending. Follow up in 1 year.

## 2019-04-04 NOTE — Assessment & Plan Note (Signed)
Declines repeat ECG given no changes from last two.  Asymptomatic.

## 2019-04-04 NOTE — Addendum Note (Signed)
Addended by: Jacqualin Combes on: 04/04/2019 03:49 PM   Modules accepted: Orders

## 2019-04-04 NOTE — Assessment & Plan Note (Signed)
Noted on labs from February 2020, patient did not return for repeat draw. Repeat TSH pending. Asymptomatic for hypothyroidism.

## 2019-04-04 NOTE — Patient Instructions (Signed)
Stop by the lab prior to leaving today. I will notify you of your results once received.   Start exercising. You should be getting 150 minutes of moderate intensity exercise weekly.  It's important to improve your diet by reducing consumption of fast food, fried food, processed snack foods, sugary drinks. Increase consumption of fresh vegetables and fruits, whole grains, water.  Ensure you are drinking 64 ounces of water daily.  It was a pleasure to see you today!   Preventive Care 67 Years and Older, Male Preventive care refers to lifestyle choices and visits with your health care provider that can promote health and wellness. This includes:  A yearly physical exam. This is also called an annual well check.  Regular dental and eye exams.  Immunizations.  Screening for certain conditions.  Healthy lifestyle choices, such as diet and exercise. What can I expect for my preventive care visit? Physical exam Your health care provider will check:  Height and weight. These may be used to calculate body mass index (BMI), which is a measurement that tells if you are at a healthy weight.  Heart rate and blood pressure.  Your skin for abnormal spots. Counseling Your health care provider may ask you questions about:  Alcohol, tobacco, and drug use.  Emotional well-being.  Home and relationship well-being.  Sexual activity.  Eating habits.  History of falls.  Memory and ability to understand (cognition).  Work and work Statistician. What immunizations do I need?  Influenza (flu) vaccine  This is recommended every year. Tetanus, diphtheria, and pertussis (Tdap) vaccine  You may need a Td booster every 10 years. Varicella (chickenpox) vaccine  You may need this vaccine if you have not already been vaccinated. Zoster (shingles) vaccine  You may need this after age 38. Pneumococcal conjugate (PCV13) vaccine  One dose is recommended after age 58. Pneumococcal  polysaccharide (PPSV23) vaccine  One dose is recommended after age 71. Measles, mumps, and rubella (MMR) vaccine  You may need at least one dose of MMR if you were born in 1957 or later. You may also need a second dose. Meningococcal conjugate (MenACWY) vaccine  You may need this if you have certain conditions. Hepatitis A vaccine  You may need this if you have certain conditions or if you travel or work in places where you may be exposed to hepatitis A. Hepatitis B vaccine  You may need this if you have certain conditions or if you travel or work in places where you may be exposed to hepatitis B. Haemophilus influenzae type b (Hib) vaccine  You may need this if you have certain conditions. You may receive vaccines as individual doses or as more than one vaccine together in one shot (combination vaccines). Talk with your health care provider about the risks and benefits of combination vaccines. What tests do I need? Blood tests  Lipid and cholesterol levels. These may be checked every 5 years, or more frequently depending on your overall health.  Hepatitis C test.  Hepatitis B test. Screening  Lung cancer screening. You may have this screening every year starting at age 71 if you have a 30-pack-year history of smoking and currently smoke or have quit within the past 15 years.  Colorectal cancer screening. All adults should have this screening starting at age 65 and continuing until age 74. Your health care provider may recommend screening at age 41 if you are at increased risk. You will have tests every 1-10 years, depending on your results and  the type of screening test.  Prostate cancer screening. Recommendations will vary depending on your family history and other risks.  Diabetes screening. This is done by checking your blood sugar (glucose) after you have not eaten for a while (fasting). You may have this done every 1-3 years.  Abdominal aortic aneurysm (AAA) screening. You  may need this if you are a current or former smoker.  Sexually transmitted disease (STD) testing. Follow these instructions at home: Eating and drinking  Eat a diet that includes fresh fruits and vegetables, whole grains, lean protein, and low-fat dairy products. Limit your intake of foods with high amounts of sugar, saturated fats, and salt.  Take vitamin and mineral supplements as recommended by your health care provider.  Do not drink alcohol if your health care provider tells you not to drink.  If you drink alcohol: ? Limit how much you have to 0-2 drinks a day. ? Be aware of how much alcohol is in your drink. In the [place] one drink equals one 12 oz bottle of beer (355 mL), one 5 oz glass of wine (148 mL), or one 1 oz glass of hard liquor (44 mL). Lifestyle  Take daily care of your teeth and gums.  Stay active. Exercise for at least 30 minutes on 5 or more days each week.  Do not use any products that contain nicotine or tobacco, such as cigarettes, e-cigarettes, and chewing tobacco. If you need help quitting, ask your health care provider.  If you are sexually active, practice safe sex. Use a condom or other form of protection to prevent STIs (sexually transmitted infections).  Talk with your health care provider about taking a low-dose aspirin or statin. What's next?  Visit your health care provider once a year for a well check visit.  Ask your health care provider how often you should have your eyes and teeth checked.  Stay up to date on all vaccines. This information is not intended to replace advice given to you by your health care provider. Make sure you discuss any questions you have with your health care provider. Document Released: 08/24/2015 Document Revised: 07/22/2018 Document Reviewed: 07/22/2018 Elsevier Patient Education  2020 [employer].

## 2019-04-05 LAB — COMPREHENSIVE METABOLIC PANEL
ALT: 16 U/L (ref 0–53)
AST: 18 U/L (ref 0–37)
Albumin: 4.7 g/dL (ref 3.5–5.2)
Alkaline Phosphatase: 60 U/L (ref 39–117)
BUN: 16 mg/dL (ref 6–23)
CO2: 29 mEq/L (ref 19–32)
Calcium: 9.5 mg/dL (ref 8.4–10.5)
Chloride: 103 mEq/L (ref 96–112)
Creatinine, Ser: 1.17 mg/dL (ref 0.40–1.50)
GFR: 62.24 mL/min (ref >60.00)
Glucose, Bld: 96 mg/dL (ref 70–99)
Potassium: 4.6 mEq/L (ref 3.5–5.1)
Sodium: 140 mEq/L (ref 135–145)
Total Bilirubin: 0.5 mg/dL (ref 0.2–1.2)
Total Protein: 6.8 g/dL (ref 6.0–8.3)

## 2019-04-05 LAB — T4, FREE: Free T4: 0.67 ng/dL (ref 0.60–1.60)

## 2019-04-05 LAB — LIPID PANEL
Cholesterol: 222 mg/dL — ABNORMAL HIGH (ref 0–200)
HDL: 41.8 mg/dL (ref >39.00)
LDL Cholesterol: 162 mg/dL — ABNORMAL HIGH (ref 0–99)
NonHDL: 180.55
Total CHOL/HDL Ratio: 5
Triglycerides: 95 mg/dL (ref 0.0–149.0)
VLDL: 19 mg/dL (ref 0.0–40.0)

## 2019-04-05 LAB — HEMOGLOBIN A1C: Hgb A1c MFr Bld: 5.8 % (ref 4.6–6.5)

## 2019-04-05 LAB — TSH: TSH: 3.42 u[IU]/mL (ref 0.35–4.50)

## 2019-04-07 ENCOUNTER — Other Ambulatory Visit: Payer: Self-pay | Admitting: Primary Care

## 2019-04-07 DIAGNOSIS — E785 Hyperlipidemia, unspecified: Secondary | ICD-10-CM

## 2019-04-07 MED ORDER — ATORVASTATIN CALCIUM 20 MG PO TABS: 20.0000 mg | ORAL_TABLET | Freq: Every day | ORAL | 3 refills | Status: DC

## 2019-04-08 ENCOUNTER — Telehealth: Payer: Self-pay | Admitting: Primary Care

## 2019-04-08 DIAGNOSIS — R9431 Abnormal electrocardiogram [ECG] [EKG]: Secondary | ICD-10-CM | POA: Diagnosis not present

## 2019-04-08 DIAGNOSIS — R1013 Epigastric pain: Secondary | ICD-10-CM | POA: Diagnosis not present

## 2019-04-08 DIAGNOSIS — R5383 Other fatigue: Secondary | ICD-10-CM | POA: Diagnosis not present

## 2019-04-08 DIAGNOSIS — R110 Nausea: Secondary | ICD-10-CM | POA: Diagnosis not present

## 2019-04-08 NOTE — Telephone Encounter (Signed)
Does he have any other symptoms? Is his employer requiring that he get tested? If not and if he doesn't have any other symptoms and hasn't been exposed then his risk is low and I don't recommend testing. If his employer is requiring it then okay to test.  I'm not sure how to get an order to [facility]. Please find out if we end up testing.

## 2019-04-08 NOTE — Telephone Encounter (Signed)
Patient stayed home from work today due to a tooth infection.  Patient has a headache. Patient's dentist at [facility] prescribed Amoxicillin and headache started after he started taking the Amoxicillin.  Patient doesn't have a fever. Patient's work wants patient tested for Covid since he stayed home from work today.  His work has the testing done at [facility] and patient needs an order to have the Covid test done at [facility].

## 2019-04-08 NOTE — Telephone Encounter (Signed)
Message left for patient to return my call.  

## 2019-04-11 ENCOUNTER — Telehealth: Payer: Self-pay | Admitting: Primary Care

## 2019-04-11 NOTE — Telephone Encounter (Signed)
Monday patient went to dentist and saw Allie Bossier also. He said the dentist gave him amoxicillin and he is allergic to Penicillin. He went to work but wasn't feeling well and on Thursday his supervisor made him go home. On Friday he went to the [place] walk-in clinic and saw Dr. Caryl Pina. He then states he needs a note for work saying he does not have the corona virus and that it was the amoxicillin that was making him sick? I asked him had he contacted [facility] since he saw them on Friday he said no he wanted to get this from [place].  CB [phone]

## 2019-04-11 NOTE — Telephone Encounter (Signed)
Spoken to patient and he stated that he already got tested at [facility] clinic. He still getting over the stomach issue with Amoxicillin. Patient stated that he knew that he had some intolerance but forgot at the time.

## 2019-04-12 ENCOUNTER — Other Ambulatory Visit: Payer: Self-pay | Admitting: Family Medicine

## 2019-04-12 NOTE — Telephone Encounter (Signed)
Message left for patient to return my call.  

## 2019-04-12 NOTE — Telephone Encounter (Signed)
Please notify patient that since I did not see him I cannot provide him a note stating that amoxicillin causes symptoms. I do see where he tested negative for coronavirus so I am happy to write a note saying that he is negative for COVID-19. He is welcome to call the [facility] clinic office with this request.

## 2019-04-14 NOTE — Telephone Encounter (Signed)
Message left for patient to return my call.  

## 2019-04-25 DIAGNOSIS — E785 Hyperlipidemia, unspecified: Secondary | ICD-10-CM | POA: Diagnosis not present

## 2019-04-25 DIAGNOSIS — R9431 Abnormal electrocardiogram [ECG] [EKG]: Secondary | ICD-10-CM | POA: Diagnosis not present

## 2019-05-12 ENCOUNTER — Encounter: Payer: Self-pay | Admitting: Primary Care

## 2019-05-18 DIAGNOSIS — R9431 Abnormal electrocardiogram [ECG] [EKG]: Secondary | ICD-10-CM | POA: Diagnosis not present

## 2019-05-18 DIAGNOSIS — E785 Hyperlipidemia, unspecified: Secondary | ICD-10-CM | POA: Diagnosis not present

## 2019-06-08 DIAGNOSIS — R9431 Abnormal electrocardiogram [ECG] [EKG]: Secondary | ICD-10-CM | POA: Diagnosis not present

## 2019-06-08 DIAGNOSIS — E785 Hyperlipidemia, unspecified: Secondary | ICD-10-CM | POA: Diagnosis not present

## 2019-08-15 ENCOUNTER — Ambulatory Visit: Payer: Commercial Managed Care - PPO | Attending: Internal Medicine

## 2019-08-15 DIAGNOSIS — Z20822 Contact with and (suspected) exposure to covid-19: Secondary | ICD-10-CM

## 2019-08-16 LAB — NOVEL CORONAVIRUS, NAA: SARS-CoV-2, NAA: NOT DETECTED

## 2019-08-18 ENCOUNTER — Ambulatory Visit: Payer: Commercial Managed Care - PPO | Attending: Internal Medicine

## 2019-08-18 DIAGNOSIS — Z20822 Contact with and (suspected) exposure to covid-19: Secondary | ICD-10-CM

## 2019-08-20 ENCOUNTER — Ambulatory Visit: Payer: Self-pay

## 2019-08-20 LAB — NOVEL CORONAVIRUS, NAA: SARS-CoV-2, NAA: DETECTED — AB

## 2019-08-20 NOTE — Telephone Encounter (Signed)
See under covid results.  Reason for Disposition . Health Information question, no triage required and triager able to answer question  Answer Assessment - Initial Assessment Questions 1. REASON FOR CALL or QUESTION: "What is your reason for calling today?" or "How can I best help you?" or "What question do you have that I can help answer?"     Results  Protocols used: INFORMATION ONLY CALL - NO TRIAGE-A-AH

## 2019-12-21 ENCOUNTER — Other Ambulatory Visit: Payer: Self-pay

## 2019-12-21 MED ORDER — TAVABOROLE 5 % EX SOLN: 1.0000 "application " | Freq: Every day | CUTANEOUS | 1 refills | Status: DC

## 2019-12-21 MED ORDER — KETOCONAZOLE 2 % EX CREA: 1.0000 "application " | TOPICAL_CREAM | Freq: Every day | CUTANEOUS | 0 refills | Status: AC

## 2019-12-21 NOTE — Telephone Encounter (Signed)
Patient came into the office yesterday asking for RFs of Ketoconazole Cream and Kerydin. Patient has made a 1 year appt in August.

## 2019-12-22 ENCOUNTER — Other Ambulatory Visit: Payer: Self-pay

## 2019-12-22 MED ORDER — CICLOPIROX OLAMINE 0.77 % EX SUSP: 1.0000 "application " | Freq: Every day | CUTANEOUS | 2 refills | Status: DC

## 2019-12-22 NOTE — Telephone Encounter (Signed)
[facility] requesting a change of medication, [place] Mon is not covered by insurance,   Okay for Ciclopirox solution erx'd

## 2020-03-28 ENCOUNTER — Other Ambulatory Visit: Payer: Self-pay

## 2020-03-28 ENCOUNTER — Ambulatory Visit (INDEPENDENT_AMBULATORY_CARE_PROVIDER_SITE_OTHER): Payer: Commercial Managed Care - PPO | Admitting: Dermatology

## 2020-03-28 DIAGNOSIS — L820 Inflamed seborrheic keratosis: Secondary | ICD-10-CM

## 2020-03-28 DIAGNOSIS — D1800 Hemangioma unspecified site: Secondary | ICD-10-CM | POA: Diagnosis not present

## 2020-03-28 DIAGNOSIS — L570 Actinic keratosis: Secondary | ICD-10-CM

## 2020-03-28 DIAGNOSIS — Z85820 Personal history of malignant melanoma of skin: Secondary | ICD-10-CM | POA: Diagnosis not present

## 2020-03-28 DIAGNOSIS — Z1283 Encounter for screening for malignant neoplasm of skin: Secondary | ICD-10-CM

## 2020-03-28 DIAGNOSIS — L814 Other melanin hyperpigmentation: Secondary | ICD-10-CM | POA: Diagnosis not present

## 2020-03-28 DIAGNOSIS — D229 Melanocytic nevi, unspecified: Secondary | ICD-10-CM

## 2020-03-28 DIAGNOSIS — D485 Neoplasm of uncertain behavior of skin: Secondary | ICD-10-CM | POA: Diagnosis not present

## 2020-03-28 DIAGNOSIS — L578 Other skin changes due to chronic exposure to nonionizing radiation: Secondary | ICD-10-CM | POA: Diagnosis not present

## 2020-03-28 DIAGNOSIS — L821 Other seborrheic keratosis: Secondary | ICD-10-CM | POA: Diagnosis not present

## 2020-03-28 HISTORY — DX: Actinic keratosis: L57.0

## 2020-03-28 NOTE — Patient Instructions (Signed)

## 2020-03-28 NOTE — Progress Notes (Signed)
Follow-Up Visit   Subjective  Cody Pearson is a 69 y.o. male who presents for the following: Annual Exam (Hx MM ). Lesion on the R nose that has been there for about a month - crusted, bleeds. The patient presents for Total-Body Skin Exam (TBSE) for skin cancer screening and mole check.  The following portions of the chart were reviewed this encounter and updated as appropriate:  Tobacco  Allergies  Meds  Problems  Med Hx  Surg Hx  Fam Hx     Review of Systems:  No other skin or systemic complaints except as noted in HPI or Assessment and Plan.  Objective  Well appearing patient in no apparent distress; mood and affect are within normal limits.  A full examination was performed including scalp, head, eyes, ears, nose, lips, neck, chest, axillae, abdomen, back, buttocks, bilateral upper extremities, bilateral lower extremities, hands, feet, fingers, toes, fingernails, and toenails. All findings within normal limits unless otherwise noted below.  Objective  L crown scalp: Well healed scar with no evidence of recurrence, no lymphadenopathy.   Objective  R nasal bridge: Crust 0.8 x 0. 5 cm      Objective  Face and scalp (25): Erythematous keratotic or waxy stuck-on papule or plaque.   Assessment & Plan  History of melanoma L crown scalp No lymphadenopathy. Clear. Observe for recurrence. Call clinic for new or changing lesions.  Recommend regular skin exams, daily broad-spectrum spf 30+ sunscreen use, and photoprotection.     Neoplasm of uncertain behavior of skin R nasal bridge  Skin / nail biopsy Type of biopsy: tangential   Informed consent: discussed and consent obtained   Timeout: patient name, date of birth, surgical site, and procedure verified   Procedure prep:  Patient was prepped and draped in usual sterile fashion Prep type:  Isopropyl alcohol Anesthesia: the lesion was anesthetized in a standard fashion   Anesthetic:  1% lidocaine w/ epinephrine  1-100,000 buffered w/ 8.4% NaHCO3 Instrument used: flexible razor blade   Hemostasis achieved with: pressure, aluminum chloride and electrodesiccation   Outcome: patient tolerated procedure well   Post-procedure details: sterile dressing applied and wound care instructions given   Dressing type: bandage and petrolatum    Specimen 1 - Surgical pathology Differential Diagnosis: D48.5 r/o CA vs other  Check Margins: No Crust 0.8 x 0. 5 cm  Inflamed seborrheic keratosis (25) Face and scalp  Destruction of lesion - Face and scalp Complexity: simple   Destruction method: cryotherapy   Informed consent: discussed and consent obtained   Timeout:  patient name, date of birth, surgical site, and procedure verified Lesion destroyed using liquid nitrogen: Yes   Region frozen until ice ball extended beyond lesion: Yes   Outcome: patient tolerated procedure well with no complications   Post-procedure details: wound care instructions given    Lentigines - Scattered tan macules - Discussed due to sun exposure - Benign, observe - Call for any changes  Seborrheic Keratoses - Stuck-on, waxy, tan-brown papules and plaques  - Discussed benign etiology and prognosis. - Observe - Call for any changes  Melanocytic Nevi - Tan-brown and/or pink-flesh-colored symmetric macules and papules - Benign appearing on exam today - Observation - Call clinic for new or changing moles - Recommend daily use of broad spectrum spf 30+ sunscreen to sun-exposed areas.   Hemangiomas - Red papules - Discussed benign nature - Observe - Call for any changes  Actinic Damage - diffuse scaly erythematous macules with underlying dyspigmentation -  Recommend daily broad spectrum sunscreen SPF 30+ to sun-exposed areas, reapply every 2 hours as needed.  - Call for new or changing lesions.  Skin cancer screening performed today.  Return in about 3 months (around 06/28/2020) for ISK follow up - treat more lesions on  the face and scalp.  Luther Redo, CMA, am acting as scribe for Sarina Ser, MD .  Documentation: I have reviewed the above documentation for accuracy and completeness, and I agree with the above.  Sarina Ser, MD

## 2020-04-04 ENCOUNTER — Encounter: Payer: Self-pay | Admitting: Dermatology

## 2020-04-09 ENCOUNTER — Telehealth: Payer: Self-pay

## 2020-04-09 NOTE — Telephone Encounter (Signed)
-----   Message from Ralene Bathe, MD sent at 04/05/2020  1:01 PM EDT ----- Skin , right nasal bridge ACTINIC KERATOSIS, EXCORIATED  Pre-Cancer  Schedule for treatment (Ln2)

## 2020-04-09 NOTE — Telephone Encounter (Signed)
Patient informed of results and appointment already scheduled.

## 2020-04-20 ENCOUNTER — Other Ambulatory Visit: Payer: Self-pay

## 2020-04-20 ENCOUNTER — Encounter: Payer: Self-pay | Admitting: Primary Care

## 2020-04-20 ENCOUNTER — Ambulatory Visit (INDEPENDENT_AMBULATORY_CARE_PROVIDER_SITE_OTHER): Payer: Commercial Managed Care - PPO | Admitting: Primary Care

## 2020-04-20 VITALS — BP 118/80 | HR 80 | Ht 69.0 in | Wt 222.0 lb

## 2020-04-20 DIAGNOSIS — Z23 Encounter for immunization: Secondary | ICD-10-CM

## 2020-04-20 DIAGNOSIS — Z0000 Encounter for general adult medical examination without abnormal findings: Secondary | ICD-10-CM

## 2020-04-20 DIAGNOSIS — R7303 Prediabetes: Secondary | ICD-10-CM | POA: Diagnosis not present

## 2020-04-20 DIAGNOSIS — N529 Male erectile dysfunction, unspecified: Secondary | ICD-10-CM | POA: Diagnosis not present

## 2020-04-20 DIAGNOSIS — E785 Hyperlipidemia, unspecified: Secondary | ICD-10-CM | POA: Diagnosis not present

## 2020-04-20 DIAGNOSIS — N401 Enlarged prostate with lower urinary tract symptoms: Secondary | ICD-10-CM | POA: Diagnosis not present

## 2020-04-20 DIAGNOSIS — R3912 Poor urinary stream: Secondary | ICD-10-CM

## 2020-04-20 LAB — LIPID PANEL
Cholesterol: 110 mg/dL (ref 0–200)
HDL: 38.8 mg/dL — ABNORMAL LOW (ref >39.00)
LDL Cholesterol: 60 mg/dL (ref 0–99)
NonHDL: 70.77
Total CHOL/HDL Ratio: 3
Triglycerides: 55 mg/dL (ref 0.0–149.0)
VLDL: 11 mg/dL (ref 0.0–40.0)

## 2020-04-20 LAB — CBC
HCT: 47.6 % (ref 39.0–52.0)
Hemoglobin: 16.1 g/dL (ref 13.0–17.0)
MCHC: 33.8 g/dL (ref 30.0–36.0)
MCV: 92.9 fl (ref 78.0–100.0)
Platelets: 173 10*3/uL (ref 150.0–400.0)
RBC: 5.12 Mil/uL (ref 4.22–5.81)
RDW: 13.8 % (ref 11.5–15.5)
WBC: 4.5 10*3/uL (ref 4.0–10.5)

## 2020-04-20 LAB — COMPREHENSIVE METABOLIC PANEL
ALT: 20 U/L (ref 0–53)
AST: 18 U/L (ref 0–37)
Albumin: 4.3 g/dL (ref 3.5–5.2)
Alkaline Phosphatase: 64 U/L (ref 39–117)
BUN: 13 mg/dL (ref 6–23)
CO2: 30 mEq/L (ref 19–32)
Calcium: 9.1 mg/dL (ref 8.4–10.5)
Chloride: 106 mEq/L (ref 96–112)
Creatinine, Ser: 1.21 mg/dL (ref 0.40–1.50)
GFR: 59.68 mL/min — ABNORMAL LOW (ref >60.00)
Glucose, Bld: 103 mg/dL — ABNORMAL HIGH (ref 70–99)
Potassium: 4.7 mEq/L (ref 3.5–5.1)
Sodium: 141 mEq/L (ref 135–145)
Total Bilirubin: 0.8 mg/dL (ref 0.2–1.2)
Total Protein: 6.4 g/dL (ref 6.0–8.3)

## 2020-04-20 LAB — PSA, MEDICARE: PSA: 0.93 ng/ml (ref 0.10–4.00)

## 2020-04-20 LAB — HEMOGLOBIN A1C: Hgb A1c MFr Bld: 5.8 % (ref 4.6–6.5)

## 2020-04-20 NOTE — Assessment & Plan Note (Signed)
Compliant to atorvastatin, repeat lipids pending.

## 2020-04-20 NOTE — Addendum Note (Signed)
Addended by: Amado Coe on: 04/20/2020 08:21 AM   Modules accepted: Orders

## 2020-04-20 NOTE — Assessment & Plan Note (Signed)
Improved on tamsulosin, following with Urology. Continue same. PSA pending.

## 2020-04-20 NOTE — Assessment & Plan Note (Signed)
Doing well on Cialis, follows with Urology.

## 2020-04-20 NOTE — Progress Notes (Signed)
Subjective:    Patient ID: Cody Pearson, male    DOB: January 25, 1951, 69 y.o.   MRN: [id number]  HPI  This visit occurred during the SARS-CoV-2 public health emergency.  Safety protocols were in place, including screening questions prior to the visit, additional usage of staff PPE, and extensive cleaning of exam room while observing appropriate contact time as indicated for disinfecting solutions.   Cody Pearson is a 69 year old male who presents today for complete physical.  Immunizations: -Tetanus: Completed in 2012 -Influenza: Due, will get at work. -Shingles: Declines, never completed  -Pneumonia: Completed in 2020 -Covid-19: Completed series   Diet: He endorses a poor diet.  Exercise: He is active at work  Eye exam: No recent visit Dental exam: Completes annually   Colonoscopy: Overdue, was due in 2017. Declines today. PSA: 0.97 in 2020 Hep C Screen: Negative   BP Readings from Last 3 Encounters:  04/04/19 120/82  09/27/18 120/74  07/07/18 120/76   Wt Readings from Last 3 Encounters:  04/20/20 222 lb (100.7 kg)  04/04/19 204 lb 12 oz (92.9 kg)  09/27/18 203 lb 8 oz (92.3 kg)     Review of Systems  Constitutional: Negative for unexpected weight change.  HENT: Negative for rhinorrhea.   Respiratory: Negative for cough and shortness of breath.   Cardiovascular: Negative for chest pain.  Gastrointestinal: Negative for constipation and diarrhea.  Genitourinary: Negative for difficulty urinating.  Musculoskeletal: Negative for arthralgias and myalgias.  Skin: Negative for rash.  Allergic/Immunologic: Negative for environmental allergies.  Neurological: Negative for dizziness, numbness and headaches.  Psychiatric/Behavioral: The patient is not nervous/anxious.        Past Medical History:  Diagnosis Date  . Arthritis   . Cancer ([place])    melanoma  . History of colonic polyps      Social History   Socioeconomic History  . Marital status: Widowed     Spouse name: Not on file  . Number of children: 3  . Years of education: Not on file  . Highest education level: Not on file  Occupational History  . Occupation: Maintenance    Employer: [employer]   Tobacco Use  . Smoking status: Never Smoker  . Smokeless tobacco: Never Used  Vaping Use  . Vaping Use: Never used  Substance and Sexual Activity  . Alcohol use: No  . Drug use: No  . Sexual activity: Not on file  Other Topics Concern  . 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   Highest level of education 12th grade.   Married.   Lives locally.   Social Determinants of Health   Financial Resource Strain:   . Difficulty of Paying Living Expenses: Not on file  Food Insecurity:   . Worried About Charity fundraiser in the Last Year: Not on file  . Ran Out of Food in the Last Year: Not on file  Transportation Needs:   . Lack of Transportation (Medical): Not on file  . Lack of Transportation (Non-Medical): Not on file  Physical Activity:   . Days of Exercise per Week: Not on file  . Minutes of Exercise per Session: Not on file  Stress:   . Feeling of Stress : Not on file  Social Connections:   . Frequency of Communication with Friends and Family: Not on file  . Frequency of Social Gatherings with Friends and Family: Not on file  . Attends  Religious Services: Not on file  . Active Member of Clubs or Organizations: Not on file  . Attends Archivist Meetings: Not on file  . Marital Status: Not on file  Intimate Partner Violence:   . Fear of Current or Ex-Partner: Not on file  . Emotionally Abused: Not on file  . Physically Abused: Not on file  . Sexually Abused: Not on file    Past Surgical History:  Procedure Laterality Date  . KNEE ARTHROSCOPY Right   . PARTIAL KNEE ARTHROPLASTY Right 10/13/2017   Procedure: UNICOMPARTMENTAL KNEE;  Surgeon: Corky Mull, MD;  Location: [facility] ORS;  Service: Orthopedics;   Laterality: Right;    Family History  Problem Relation Age of Onset  . Aneurysm Paternal Grandmother     Allergies  Allergen Reactions  . Amoxicillin Nausea Only    Nausea, anorexia, body aches    Current Outpatient Medications on File Prior to Visit  Medication Sig Dispense Refill  . acetaminophen (TYLENOL 8 HOUR) 650 MG CR tablet Take 650 mg by mouth every 8 (eight) hours as needed for pain.    Marland Kitchen atorvastatin (LIPITOR) 20 MG tablet Take 1 tablet (20 mg total) by mouth daily. For cholesterol. 90 tablet 3  . ciclopirox (LOPROX) 0.77 % SUSP Apply 1 application topically at bedtime. 4 mL 2  . Multiple Vitamins-Minerals (CENTRUM SILVER PO) Take 1 tablet by mouth daily.    . naproxen sodium (ANAPROX) 220 MG tablet Take 440 mg by mouth 2 (two) times daily.     . tadalafil (CIALIS) 5 MG tablet Take 5 mg by mouth daily.    . tamsulosin (FLOMAX) 0.4 MG CAPS capsule Take 1 capsule by mouth 30 minutes before breakfast daily for prostate. 90 capsule 1  . Tavaborole (KERYDIN) 5 % SOLN Apply 1 application topically daily. 4 mL 1  . Testosterone 20 % CREA by Does not apply route.     No current facility-administered medications on file prior to visit.    BP 118/80   Pulse 80   Ht 5\' 9"  (1.753 m)   Wt 222 lb (100.7 kg)   SpO2 95%   BMI 32.78 kg/m    Objective:   Physical Exam HENT:     Right Ear: Tympanic membrane and ear canal normal.     Left Ear: Tympanic membrane and ear canal normal.  Eyes:     Pupils: Pupils are equal, round, and reactive to light.  Cardiovascular:     Rate and Rhythm: Normal rate and regular rhythm.  Pulmonary:     Effort: Pulmonary effort is normal.     Breath sounds: Normal breath sounds.  Abdominal:     General: Bowel sounds are normal.     Palpations: Abdomen is soft.     Tenderness: There is no abdominal tenderness.  Musculoskeletal:        General: Normal range of motion.     Cervical back: Neck supple.  Skin:    General: Skin is warm and dry.    Neurological:     Mental Status: He is alert and oriented to person, place, and time.     Cranial Nerves: No cranial nerve deficit.     Deep Tendon Reflexes:     Reflex Scores:      Patellar reflexes are 2+ on the right side and 2+ on the left side. Psychiatric:        Mood and Affect: Mood normal.  Assessment & Plan:

## 2020-04-20 NOTE — Patient Instructions (Signed)
Stop by the lab prior to leaving today. I will notify you of your results once received.   Start exercising. You should be getting 150 minutes of moderate intensity exercise weekly.  It's important to improve your diet by reducing consumption of fast food, fried food, processed snack foods, sugary drinks. Increase consumption of fresh vegetables and fruits, whole grains, water.  Ensure you are drinking 64 ounces of water daily.  Please reconsider the colonoscopy, notify me if you change your mind.  It was a pleasure to see you today!   Preventive Care 68 Years and Older, Male Preventive care refers to lifestyle choices and visits with your health care provider that can promote health and wellness. This includes:  A yearly physical exam. This is also called an annual well check.  Regular dental and eye exams.  Immunizations.  Screening for certain conditions.  Healthy lifestyle choices, such as diet and exercise. What can I expect for my preventive care visit? Physical exam Your health care provider will check:  Height and weight. These may be used to calculate body mass index (BMI), which is a measurement that tells if you are at a healthy weight.  Heart rate and blood pressure.  Your skin for abnormal spots. Counseling Your health care provider may ask you questions about:  Alcohol, tobacco, and drug use.  Emotional well-being.  Home and relationship well-being.  Sexual activity.  Eating habits.  History of falls.  Memory and ability to understand (cognition).  Work and work Statistician. What immunizations do I need?  Influenza (flu) vaccine  This is recommended every year. Tetanus, diphtheria, and pertussis (Tdap) vaccine  You may need a Td booster every 10 years. Varicella (chickenpox) vaccine  You may need this vaccine if you have not already been vaccinated. Zoster (shingles) vaccine  You may need this after age 74. Pneumococcal conjugate (PCV13)  vaccine  One dose is recommended after age 6. Pneumococcal polysaccharide (PPSV23) vaccine  One dose is recommended after age 54. Measles, mumps, and rubella (MMR) vaccine  You may need at least one dose of MMR if you were born in 1957 or later. You may also need a second dose. Meningococcal conjugate (MenACWY) vaccine  You may need this if you have certain conditions. Hepatitis A vaccine  You may need this if you have certain conditions or if you travel or work in places where you may be exposed to hepatitis A. Hepatitis B vaccine  You may need this if you have certain conditions or if you travel or work in places where you may be exposed to hepatitis B. Haemophilus influenzae type b (Hib) vaccine  You may need this if you have certain conditions. You may receive vaccines as individual doses or as more than one vaccine together in one shot (combination vaccines). Talk with your health care provider about the risks and benefits of combination vaccines. What tests do I need? Blood tests  Lipid and cholesterol levels. These may be checked every 5 years, or more frequently depending on your overall health.  Hepatitis C test.  Hepatitis B test. Screening  Lung cancer screening. You may have this screening every year starting at age 21 if you have a 30-pack-year history of smoking and currently smoke or have quit within the past 15 years.  Colorectal cancer screening. All adults should have this screening starting at age 41 and continuing until age 71. Your health care provider may recommend screening at age 19 if you are at increased risk.  You will have tests every 1-10 years, depending on your results and the type of screening test.  Prostate cancer screening. Recommendations will vary depending on your family history and other risks.  Diabetes screening. This is done by checking your blood sugar (glucose) after you have not eaten for a while (fasting). You may have this done  every 1-3 years.  Abdominal aortic aneurysm (AAA) screening. You may need this if you are a current or former smoker.  Sexually transmitted disease (STD) testing. Follow these instructions at home: Eating and drinking  Eat a diet that includes fresh fruits and vegetables, whole grains, lean protein, and low-fat dairy products. Limit your intake of foods with high amounts of sugar, saturated fats, and salt.  Take vitamin and mineral supplements as recommended by your health care provider.  Do not drink alcohol if your health care provider tells you not to drink.  If you drink alcohol: ? Limit how much you have to 0-2 drinks a day. ? Be aware of how much alcohol is in your drink. In the [place] one drink equals one 12 oz bottle of beer (355 mL), one 5 oz glass of wine (148 mL), or one 1 oz glass of hard liquor (44 mL). Lifestyle  Take daily care of your teeth and gums.  Stay active. Exercise for at least 30 minutes on 5 or more days each week.  Do not use any products that contain nicotine or tobacco, such as cigarettes, e-cigarettes, and chewing tobacco. If you need help quitting, ask your health care provider.  If you are sexually active, practice safe sex. Use a condom or other form of protection to prevent STIs (sexually transmitted infections).  Talk with your health care provider about taking a low-dose aspirin or statin. What's next?  Visit your health care provider once a year for a well check visit.  Ask your health care provider how often you should have your eyes and teeth checked.  Stay up to date on all vaccines. This information is not intended to replace advice given to you by your health care provider. Make sure you discuss any questions you have with your health care provider. Document Revised: 07/22/2018 Document Reviewed: 07/22/2018 Elsevier Patient Education  2020 [employer].

## 2020-04-20 NOTE — Assessment & Plan Note (Signed)
Shingrix due, he continues to decline. Will get flu shot at work. Pneumovax due, provided today. PSA due, pending. Colonoscopy overdue, strongly advise he have this done, he declines.   Discussed the importance of a healthy diet and regular exercise in order for weight loss, and to reduce the risk of any potential medical problems.  Exam today unremarkable. Labs pending.

## 2020-04-20 NOTE — Assessment & Plan Note (Signed)
Discussed the importance of a healthy diet and regular exercise in order for weight loss, and to reduce the risk of any potential medical problems.  Weight gain noted over the last year.   Repeat A1C pending.

## 2020-04-21 ENCOUNTER — Telehealth: Payer: Self-pay

## 2020-04-21 NOTE — Telephone Encounter (Signed)
-----   Message from Pleas Koch, NP sent at 04/20/2020  4:22 PM EDT ----- Please notify patient: I do not believe he uses MyChart.  Blood sugars are still in the prediabetic range, overall stable from 1 year ago.  Prostate level appears normal.  Cholesterol, liver, electrolytes, blood counts look good. Kidney function appears stable

## 2020-04-21 NOTE — Telephone Encounter (Signed)
Informed patient of results

## 2020-04-24 ENCOUNTER — Other Ambulatory Visit: Payer: Self-pay | Admitting: Primary Care

## 2020-04-24 DIAGNOSIS — E785 Hyperlipidemia, unspecified: Secondary | ICD-10-CM

## 2020-05-08 DIAGNOSIS — N401 Enlarged prostate with lower urinary tract symptoms: Secondary | ICD-10-CM | POA: Diagnosis not present

## 2020-05-08 DIAGNOSIS — Z79899 Other long term (current) drug therapy: Secondary | ICD-10-CM | POA: Diagnosis not present

## 2020-05-08 DIAGNOSIS — E291 Testicular hypofunction: Secondary | ICD-10-CM | POA: Diagnosis not present

## 2020-05-08 DIAGNOSIS — N5201 Erectile dysfunction due to arterial insufficiency: Secondary | ICD-10-CM | POA: Diagnosis not present

## 2020-06-22 DIAGNOSIS — Z23 Encounter for immunization: Secondary | ICD-10-CM | POA: Diagnosis not present

## 2020-06-26 ENCOUNTER — Other Ambulatory Visit: Payer: Self-pay

## 2020-06-26 ENCOUNTER — Ambulatory Visit (INDEPENDENT_AMBULATORY_CARE_PROVIDER_SITE_OTHER): Payer: Commercial Managed Care - PPO | Admitting: Dermatology

## 2020-06-26 ENCOUNTER — Encounter: Payer: Self-pay | Admitting: Dermatology

## 2020-06-26 DIAGNOSIS — L578 Other skin changes due to chronic exposure to nonionizing radiation: Secondary | ICD-10-CM | POA: Diagnosis not present

## 2020-06-26 DIAGNOSIS — L821 Other seborrheic keratosis: Secondary | ICD-10-CM | POA: Diagnosis not present

## 2020-06-26 DIAGNOSIS — L820 Inflamed seborrheic keratosis: Secondary | ICD-10-CM

## 2020-06-26 NOTE — Progress Notes (Signed)
   Follow-Up Visit   Subjective  Cody Pearson is a 69 y.o. male who presents for the following: ISK f/u (58m f/u, face, scalp, LN2 x 25 last visit) and growth (L side, irritating). He has other areas to be evaluated today.  The following portions of the chart were reviewed this encounter and updated as appropriate:  Tobacco  Allergies  Meds  Problems  Med Hx  Surg Hx  Fam Hx     Review of Systems:  No other skin or systemic complaints except as noted in HPI or Assessment and Plan.  Objective  Well appearing patient in no apparent distress; mood and affect are within normal limits.  A focused examination was performed including face, scalp, L side. Relevant physical exam findings are noted in the Assessment and Plan.  Objective  Left Flank x 4, scalp x 35 (39): Erythematous keratotic or waxy stuck-on papule or plaque.    Assessment & Plan  Inflamed seborrheic keratosis (39) Left Flank x 4, scalp x 35  Destruction of lesion - Left Flank x 4, scalp x 35 Complexity: simple   Destruction method: cryotherapy   Informed consent: discussed and consent obtained   Timeout:  patient name, date of birth, surgical site, and procedure verified Lesion destroyed using liquid nitrogen: Yes   Region frozen until ice ball extended beyond lesion: Yes   Outcome: patient tolerated procedure well with no complications   Post-procedure details: wound care instructions given    Seborrheic Keratoses - Stuck-on, waxy, tan-brown papules and plaques  - Discussed benign etiology and prognosis. - Observe - Call for any changes  Actinic Damage - chronic, secondary to cumulative UV radiation exposure/sun exposure over time - diffuse scaly erythematous macules with underlying dyspigmentation - Recommend daily broad spectrum sunscreen SPF 30+ to sun-exposed areas, reapply every 2 hours as needed.  - Call for new or changing lesions.  Return in about 6 months (around 12/24/2020) for TBSE, hx of  melanoma.   I, [name], RMA, am acting as scribe for Sarina Ser, MD .  Documentation: I have reviewed the above documentation for accuracy and completeness, and I agree with the above.  Sarina Ser, MD

## 2020-06-29 ENCOUNTER — Encounter: Payer: Self-pay | Admitting: Dermatology

## 2020-10-07 ENCOUNTER — Other Ambulatory Visit: Payer: Self-pay | Admitting: Dermatology

## 2020-10-09 ENCOUNTER — Other Ambulatory Visit: Payer: Self-pay

## 2020-10-09 MED ORDER — CICLOPIROX OLAMINE 0.77 % EX SUSP
1.0000 | Freq: Every day | CUTANEOUS | 2 refills | Status: DC
Start: 2020-10-09 — End: 2024-06-03

## 2020-10-09 NOTE — Progress Notes (Signed)
RX RF request

## 2020-10-26 ENCOUNTER — Other Ambulatory Visit: Payer: Self-pay

## 2020-10-26 ENCOUNTER — Ambulatory Visit (INDEPENDENT_AMBULATORY_CARE_PROVIDER_SITE_OTHER): Payer: Commercial Managed Care - PPO | Admitting: Primary Care

## 2020-10-26 ENCOUNTER — Encounter: Payer: Self-pay | Admitting: Primary Care

## 2020-10-26 VITALS — BP 118/78 | HR 82 | Temp 98.4°F | Ht 69.0 in | Wt 224.0 lb

## 2020-10-26 DIAGNOSIS — R0683 Snoring: Secondary | ICD-10-CM | POA: Diagnosis not present

## 2020-10-26 DIAGNOSIS — Z1211 Encounter for screening for malignant neoplasm of colon: Secondary | ICD-10-CM | POA: Diagnosis not present

## 2020-10-26 NOTE — Assessment & Plan Note (Signed)
Chronic for years, new significant other has noticed periods of apnea and snoring during sleep. Epworth Sleepiness Scale of 6 today.   Referral placed to pulmonology.

## 2020-10-26 NOTE — Patient Instructions (Signed)
You will be contacted regarding your referral to pulmonology for the sleep study and to GI for the colonoscopy.  Please let us know if you have not been contacted within two weeks.   It was a pleasure to see you today!

## 2020-10-26 NOTE — Progress Notes (Signed)
Subjective:    Patient ID: Cody Pearson, male    DOB: 1950-10-03, 70 y.o.   MRN: [id number]  HPI  Cody Pearson is a very pleasant 70 y.o. male with a history of prediabetes, erectile dysfunction, hyperlipidemia who presents today to discuss snoring. He is also due for repeat colonoscopy.   Chronic snoring for years, recently became involved with a relationship and his significant other has noticed snoring, periods of apnea during the night with gasping for air. He does notice daytime tiredness if resting.   He's never had a sleep study, his oldest son has a diagnosis of sleep apnea. He denies chest pain, headaches, shortness of breath.   BP Readings from Last 3 Encounters:  10/26/20 118/78  04/20/20 118/80  04/04/19 120/82      Review of Systems  Respiratory: Negative for shortness of breath.   Cardiovascular: Negative for chest pain.  Neurological: Negative for dizziness and headaches.         Past Medical History:  Diagnosis Date  . Actinic keratosis 03/28/2020  . Arthritis   . Cancer ([place])    melanoma  . History of colonic polyps   . Melanoma ([place]) ~2017   scalp, txted at [facility] History   Socioeconomic History  . Marital status: Widowed    Spouse name: Not on file  . Number of children: 3  . Years of education: Not on file  . Highest education level: Not on file  Occupational History  . Occupation: Maintenance    Employer: [employer]   Tobacco Use  . Smoking status: Never Smoker  . Smokeless tobacco: Never Used  Vaping Use  . Vaping Use: Never used  Substance and Sexual Activity  . Alcohol use: No  . Drug use: No  . Sexual activity: Not on file  Other Topics Concern  . 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   Highest level of education 12th grade.   Married.   Lives locally.   Social Determinants of Health   Financial Resource Strain: Not on file  Food Insecurity:  Not on file  Transportation Needs: Not on file  Physical Activity: Not on file  Stress: Not on file  Social Connections: Not on file  Intimate Partner Violence: Not on file    Past Surgical History:  Procedure Laterality Date  . KNEE ARTHROSCOPY Right   . PARTIAL KNEE ARTHROPLASTY Right 10/13/2017   Procedure: UNICOMPARTMENTAL KNEE;  Surgeon: Corky Mull, MD;  Location: [facility] ORS;  Service: Orthopedics;  Laterality: Right;    Family History  Problem Relation Age of Onset  . Aneurysm Paternal Grandmother     Allergies  Allergen Reactions  . Amoxicillin Nausea Only    Nausea, anorexia, body aches    Current Outpatient Medications on File Prior to Visit  Medication Sig Dispense Refill  . acetaminophen (TYLENOL) 650 MG CR tablet Take 650 mg by mouth every 8 (eight) hours as needed for pain.    Marland Kitchen atorvastatin (LIPITOR) 20 MG tablet TAKE 1 TABLET(20 MG) BY MOUTH DAILY FOR CHOLESTEROL 90 tablet 3  . ciclopirox (LOPROX) 0.77 % SUSP Apply 1 application topically at bedtime. 4 mL 2  . ketoconazole (NIZORAL) 2 % cream APPLY TO BOTH FEET, BETWEEN TOES EVERY NIGHT AT BEDTIME 60 g 1  . Multiple Vitamins-Minerals (CENTRUM SILVER PO) Take 1 tablet by mouth daily.    . naproxen sodium (ANAPROX)  220 MG tablet Take 440 mg by mouth 2 (two) times daily.     . tadalafil (CIALIS) 5 MG tablet Take 5 mg by mouth daily.    Corrie Dandy (KERYDIN) 5 % SOLN Apply 1 application topically daily. 4 mL 1  . Testosterone 20 % CREA by Does not apply route.     No current facility-administered medications on file prior to visit.    BP 118/78   Pulse 82   Temp 98.4 F (36.9 C) (Temporal)   Ht 5\' 9"  (1.753 m)   Wt 224 lb (101.6 kg)   SpO2 96%   BMI 33.08 kg/m  Objective:   Physical Exam Cardiovascular:     Rate and Rhythm: Normal rate and regular rhythm.  Pulmonary:     Effort: Pulmonary effort is normal.     Breath sounds: Normal breath sounds. No wheezing or rales.  Musculoskeletal:     Cervical  back: Neck supple.  Skin:    General: Skin is warm and dry.  Neurological:     Mental Status: He is alert and oriented to person, place, and time.           Assessment & Plan:      This visit occurred during the SARS-CoV-2 public health emergency.  Safety protocols were in place, including screening questions prior to the visit, additional usage of staff PPE, and extensive cleaning of exam room while observing appropriate contact time as indicated for disinfecting solutions.

## 2020-12-04 NOTE — Patient Instructions (Addendum)
Untreated sleep apnea put you at higher risk for cardiovascular disease, cardiac arrhythmias, pulmonary hypertension, stroke, diabetes and increased risk for daytime accidents  Treatment options for sleep apnea include weight loss, side sleeping position, oral appliance, CPAP therapy or referral to ear nose and throat for possible surgical options  Orders: Home sleep study   Follow-up: 4-6 week televisit with Beth     Sleep Apnea Sleep apnea affects breathing during sleep. It causes breathing to stop for a short time or to become shallow. It can also increase the risk of:  Heart attack.  Stroke.  Being very overweight (obese).  Diabetes.  Heart failure.  Irregular heartbeat. The goal of treatment is to help you breathe normally again. What are the causes? There are three kinds of sleep apnea:  Obstructive sleep apnea. This is caused by a blocked or collapsed airway.  Central sleep apnea. This happens when the brain does not send the right signals to the muscles that control breathing.  Mixed sleep apnea. This is a combination of obstructive and central sleep apnea. The most common cause of this condition is a collapsed or blocked airway. This can happen if:  Your throat muscles are too relaxed.  Your tongue and tonsils are too large.  You are overweight.  Your airway is too small.   What increases the risk?  Being overweight.  Smoking.  Having a small airway.  Being older.  Being male.  Drinking alcohol.  Taking medicines to calm yourself (sedatives or tranquilizers).  Having family members with the condition. What are the signs or symptoms?  Trouble staying asleep.  Being sleepy or tired during the day.  Getting angry a lot.  Loud snoring.  Headaches in the morning.  Not being able to focus your mind (concentrate).  Forgetting things.  Less interest in sex.  Mood swings.  Personality changes.  Feelings of sadness  (depression).  Waking up a lot during the night to pee (urinate).  Dry mouth.  Sore throat. How is this diagnosed?  Your medical history.  A physical exam.  A test that is done when you are sleeping (sleep study). The test is most often done in a sleep lab but may also be done at home. How is this treated?  Sleeping on your side.  Using a medicine to get rid of mucus in your nose (decongestant).  Avoiding the use of alcohol, medicines to help you relax, or certain pain medicines (narcotics).  Losing weight, if needed.  Changing your diet.  Not smoking.  Using a machine to open your airway while you sleep, such as: ? An oral appliance. This is a mouthpiece that shifts your lower jaw forward. ? A CPAP device. This device blows air through a mask when you breathe out (exhale). ? An EPAP device. This has valves that you put in each nostril. ? A BPAP device. This device blows air through a mask when you breathe in (inhale) and breathe out.  Having surgery if other treatments do not work. It is important to get treatment for sleep apnea. Without treatment, it can lead to:  High blood pressure.  Coronary artery disease.  In men, not being able to have an erection (impotence).  Reduced thinking ability.   Follow these instructions at home: Lifestyle  Make changes that your doctor recommends.  Eat a healthy diet.  Lose weight if needed.  Avoid alcohol, medicines to help you relax, and some pain medicines.  Do not use any products that  contain nicotine or tobacco, such as cigarettes, e-cigarettes, and chewing tobacco. If you need help quitting, ask your doctor. General instructions  Take over-the-counter and prescription medicines only as told by your doctor.  If you were given a machine to use while you sleep, use it only as told by your doctor.  If you are having surgery, make sure to tell your doctor you have sleep apnea. You may need to bring your device with  you.  Keep all follow-up visits as told by your doctor. This is important. Contact a doctor if:  The machine that you were given to use during sleep bothers you or does not seem to be working.  You do not get better.  You get worse. Get help right away if:  Your chest hurts.  You have trouble breathing in enough air.  You have an uncomfortable feeling in your back, arms, or stomach.  You have trouble talking.  One side of your body feels weak.  A part of your face is hanging down. These symptoms may be an emergency. Do not wait to see if the symptoms will go away. Get medical help right away. Call your local emergency services (911 in the [place] Do not drive yourself to the hospital. Summary  This condition affects breathing during sleep.  The most common cause is a collapsed or blocked airway.  The goal of treatment is to help you breathe normally while you sleep. This information is not intended to replace advice given to you by your health care provider. Make sure you discuss any questions you have with your health care provider. Document Revised: 05/14/2018 Document Reviewed: 03/23/2018 Elsevier Patient Education  [place].

## 2020-12-04 NOTE — Progress Notes (Signed)
@Patient  ID: Cody Pearson, male    DOB: [birth year], 70 y.o.   MRN: [id number]  Chief Complaint  Patient presents with  . Consult    Sleep consult.    Referring provider: Pleas Koch, NP  HPI: 70 year old male, never smoked.  Past medical history significant for hyperlipidemia, borderline diabetes.  Patient referred to [facility] pulmonary for sleep consult by PCP.  12/05/2020 Patient presents today for sleep consults. He reports symptoms of snoring, restless sleep and witness apnea. No prior sleep study. He goes to bed between 10:30-11pm and gets out of bed at 6am. He wakes up three times at night to use restroom. His weight is up 10-20lbs. Goal would be around 190-200lbs. He had symptoms prior to weight gain. His son has sleep apnea.   Sleep questionnaire Prior sleep study- None Symptoms- Restless sleep, witness apnea, loud snoring  Bedtime-10:30-11pm Nocturnal awakenings- 3 Time out of bed in morning- 6am Epworth score- 3  Allergies  Allergen Reactions  . Amoxicillin Nausea Only    Nausea, anorexia, body aches    Immunization History  Administered Date(s) Administered  . Fluad Quad(high Dose 65+) 05/25/2020  . Influenza,inj,Quad PF,6+ Mos 05/12/2017  . Moderna Sars-Covid-2 Vaccination 09/19/2019, 10/17/2019, 06/22/2020  . Pneumococcal Conjugate-13 04/04/2019  . Pneumococcal Polysaccharide-23 04/20/2020  . Tdap 12/12/2010    Past Medical History:  Diagnosis Date  . Actinic keratosis 03/28/2020  . Arthritis   . Cancer ([place])    melanoma  . History of colonic polyps   . Melanoma ([place]) ~2017   scalp, txted at [place] History: Social History   Tobacco Use  Smoking Status Never Smoker  Smokeless Tobacco Never Used   Counseling given: Not Answered   Outpatient Medications Prior to Visit  Medication Sig Dispense Refill  . acetaminophen (TYLENOL) 650 MG CR tablet Take 650 mg by mouth every 8 (eight) hours as needed for pain.    Marland Kitchen  atorvastatin (LIPITOR) 20 MG tablet TAKE 1 TABLET(20 MG) BY MOUTH DAILY FOR CHOLESTEROL 90 tablet 3  . ciclopirox (LOPROX) 0.77 % SUSP Apply 1 application topically at bedtime. 4 mL 2  . ketoconazole (NIZORAL) 2 % cream APPLY TO BOTH FEET, BETWEEN TOES EVERY NIGHT AT BEDTIME 60 g 1  . Multiple Vitamins-Minerals (CENTRUM SILVER PO) Take 1 tablet by mouth daily.    . naproxen sodium (ANAPROX) 220 MG tablet Take 440 mg by mouth 2 (two) times daily.     . tadalafil (CIALIS) 5 MG tablet Take 5 mg by mouth daily.    Corrie Dandy (KERYDIN) 5 % SOLN Apply 1 application topically daily. 4 mL 1  . Testosterone 20 % CREA by Does not apply route.     No facility-administered medications prior to visit.   Review of Systems  Review of Systems  Constitutional: Negative.   Respiratory: Negative.   Cardiovascular: Negative.   Psychiatric/Behavioral: Positive for sleep disturbance.   Physical Exam  BP 132/80 (BP Location: Left Arm, Patient Position: Sitting, Cuff Size: Normal)   Pulse 91   Temp (!) 97.5 F (36.4 C) (Temporal)   Ht 5' 8.11" (1.73 m)   Wt 224 lb 9.6 oz (101.9 kg)   SpO2 97%   BMI 34.04 kg/m  Physical Exam Constitutional:      Appearance: Normal appearance.  HENT:     Head: Normocephalic and atraumatic.     Mouth/Throat:     Mouth: Mucous membranes are moist.     Pharynx: Oropharynx is  clear.     Comments: Mallampati class I-II Cardiovascular:     Rate and Rhythm: Normal rate and regular rhythm.  Pulmonary:     Effort: Pulmonary effort is normal.     Breath sounds: Normal breath sounds. No wheezing, rhonchi or rales.  Musculoskeletal:        General: Normal range of motion.  Skin:    General: Skin is warm and dry.  Neurological:     General: No focal deficit present.     Mental Status: He is alert and oriented to person, place, and time. Mental status is at baseline.  Psychiatric:        Mood and Affect: Mood normal.        Behavior: Behavior normal.        Thought  Content: Thought content normal.        Judgment: Judgment normal.      Lab Results:  CBC    Component Value Date/Time   WBC 4.5 04/20/2020 0823   RBC 5.12 04/20/2020 0823   HGB 16.1 04/20/2020 0823   HCT 47.6 04/20/2020 0823   PLT 173.0 04/20/2020 0823   MCV 92.9 04/20/2020 0823   MCH 31.1 10/14/2017 0301   MCHC 33.8 04/20/2020 0823   RDW 13.8 04/20/2020 0823   LYMPHSABS 0.6 (L) 10/14/2017 0301   MONOABS 1.1 (H) 10/14/2017 0301   EOSABS 0.0 10/14/2017 0301   BASOSABS 0.0 10/14/2017 0301    BMET    Component Value Date/Time   NA 141 04/20/2020 0823   K 4.7 04/20/2020 0823   CL 106 04/20/2020 0823   CO2 30 04/20/2020 0823   GLUCOSE 103 (H) 04/20/2020 0823   BUN 13 04/20/2020 0823   CREATININE 1.21 04/20/2020 0823   CALCIUM 9.1 04/20/2020 0823   GFRNONAA >60 10/14/2017 0301   GFRAA >60 10/14/2017 0301    BNP No results found for: BNP  ProBNP No results found for: PROBNP  Imaging: No results found.   Assessment & Plan:   Snoring - Patient has symptoms of snoring, witnessed apnea and restless sleep. Weight is up 10-20lbs. Epworth 3/24. Moderate suspicion patient could have underlying sleep apnea. Needs home sleep study to evaluate.  - Discussed how untreated sleep apnea puts patient at higher risk or cardiovascular disease, cardiac arrhythmias, stroke, pulmonary HTN, diabetes and increased risk for daytime accidents. Briefly reviewed treatment options including weight loss, oral appliance, CPAP therapy and referral to ENT for possible surgical options.  - FU in 4-6 weeks televisit to review sleep studies    Martyn Ehrich, NP 12-12-20

## 2020-12-05 ENCOUNTER — Ambulatory Visit (INDEPENDENT_AMBULATORY_CARE_PROVIDER_SITE_OTHER): Payer: Commercial Managed Care - PPO | Admitting: Primary Care

## 2020-12-05 ENCOUNTER — Encounter: Payer: Self-pay | Admitting: Primary Care

## 2020-12-05 ENCOUNTER — Other Ambulatory Visit: Payer: Self-pay

## 2020-12-05 VITALS — BP 132/80 | HR 91 | Temp 97.5°F | Ht 68.11 in | Wt 224.6 lb

## 2020-12-05 DIAGNOSIS — R0681 Apnea, not elsewhere classified: Secondary | ICD-10-CM

## 2020-12-05 DIAGNOSIS — R0683 Snoring: Secondary | ICD-10-CM

## 2020-12-05 NOTE — Progress Notes (Signed)
Reviewed and agree with assessment/plan.   Chesley Mires, MD [facility] Pulmonary/Critical Care 12/05/2020, 10:07 AM Pager:  [phone]

## 2020-12-05 NOTE — Assessment & Plan Note (Addendum)
-   Patient has symptoms of snoring, witnessed apnea and restless sleep. Weight is up 10-20lbs. Epworth 3/24. Moderate suspicion patient could have underlying sleep apnea. Needs home sleep study to evaluate.  - Discussed how untreated sleep apnea puts patient at higher risk or cardiovascular disease, cardiac arrhythmias, stroke, pulmonary HTN, diabetes and increased risk for daytime accidents. Briefly reviewed treatment options including weight loss, oral appliance, CPAP therapy and referral to ENT for possible surgical options.  - FU in 4-6 weeks televisit to review sleep studies

## 2020-12-13 ENCOUNTER — Other Ambulatory Visit: Payer: Self-pay

## 2020-12-13 ENCOUNTER — Ambulatory Visit: Payer: Commercial Managed Care - PPO

## 2020-12-13 DIAGNOSIS — R0683 Snoring: Secondary | ICD-10-CM

## 2020-12-13 DIAGNOSIS — G4733 Obstructive sleep apnea (adult) (pediatric): Secondary | ICD-10-CM | POA: Diagnosis not present

## 2020-12-13 DIAGNOSIS — R0681 Apnea, not elsewhere classified: Secondary | ICD-10-CM

## 2020-12-14 DIAGNOSIS — G4733 Obstructive sleep apnea (adult) (pediatric): Secondary | ICD-10-CM | POA: Diagnosis not present

## 2020-12-20 ENCOUNTER — Ambulatory Visit: Payer: Commercial Managed Care - PPO | Admitting: Dermatology

## 2021-01-10 ENCOUNTER — Ambulatory Visit (INDEPENDENT_AMBULATORY_CARE_PROVIDER_SITE_OTHER): Payer: Commercial Managed Care - PPO | Admitting: Dermatology

## 2021-01-10 ENCOUNTER — Other Ambulatory Visit: Payer: Self-pay

## 2021-01-10 ENCOUNTER — Encounter: Payer: Self-pay | Admitting: Dermatology

## 2021-01-10 DIAGNOSIS — D1800 Hemangioma unspecified site: Secondary | ICD-10-CM

## 2021-01-10 DIAGNOSIS — L570 Actinic keratosis: Secondary | ICD-10-CM | POA: Diagnosis not present

## 2021-01-10 DIAGNOSIS — L578 Other skin changes due to chronic exposure to nonionizing radiation: Secondary | ICD-10-CM | POA: Diagnosis not present

## 2021-01-10 DIAGNOSIS — Z1283 Encounter for screening for malignant neoplasm of skin: Secondary | ICD-10-CM | POA: Diagnosis not present

## 2021-01-10 DIAGNOSIS — L820 Inflamed seborrheic keratosis: Secondary | ICD-10-CM | POA: Diagnosis not present

## 2021-01-10 DIAGNOSIS — D229 Melanocytic nevi, unspecified: Secondary | ICD-10-CM

## 2021-01-10 DIAGNOSIS — Z85820 Personal history of malignant melanoma of skin: Secondary | ICD-10-CM

## 2021-01-10 DIAGNOSIS — L814 Other melanin hyperpigmentation: Secondary | ICD-10-CM

## 2021-01-10 DIAGNOSIS — L821 Other seborrheic keratosis: Secondary | ICD-10-CM

## 2021-01-10 NOTE — Progress Notes (Signed)
Follow-Up Visit   Subjective  Cody Pearson is a 70 y.o. male who presents for the following: Annual Exam (Hx MM on the scalp - tx in 2017 with MOHS ). The patient presents for Total-Body Skin Exam (TBSE) for skin cancer screening and mole check.  The following portions of the chart were reviewed this encounter and updated as appropriate:   Tobacco  Allergies  Meds  Problems  Med Hx  Surg Hx  Fam Hx     Review of Systems:  No other skin or systemic complaints except as noted in HPI or Assessment and Plan.  Objective  Well appearing patient in no apparent distress; mood and affect are within normal limits.  A full examination was performed including scalp, head, eyes, ears, nose, lips, neck, chest, axillae, abdomen, back, buttocks, bilateral upper extremities, bilateral lower extremities, hands, feet, fingers, toes, fingernails, and toenails. All findings within normal limits unless otherwise noted below.  Objective  Scalp x 16 (16): Erythematous keratotic or waxy stuck-on papule or plaque.   Objective  Post scalp x 1: Erythematous thin papules/macules with gritty scale.   Assessment & Plan  Inflamed seborrheic keratosis (16) Scalp x 16  Destruction of lesion - Scalp x 16 Complexity: simple   Destruction method: cryotherapy   Informed consent: discussed and consent obtained   Timeout:  patient name, date of birth, surgical site, and procedure verified Lesion destroyed using liquid nitrogen: Yes   Region frozen until ice ball extended beyond lesion: Yes   Outcome: patient tolerated procedure well with no complications   Post-procedure details: wound care instructions given    AK (actinic keratosis) Post scalp x 1  Destruction of lesion - Post scalp x 1 Complexity: simple   Destruction method: cryotherapy   Informed consent: discussed and consent obtained   Timeout:  patient name, date of birth, surgical site, and procedure verified Lesion destroyed using  liquid nitrogen: Yes   Region frozen until ice ball extended beyond lesion: Yes   Outcome: patient tolerated procedure well with no complications   Post-procedure details: wound care instructions given    Skin cancer screening   Lentigines - Scattered tan macules - Due to sun exposure - Benign-appering, observe - Recommend daily broad spectrum sunscreen SPF 30+ to sun-exposed areas, reapply every 2 hours as needed. - Call for any changes  Seborrheic Keratoses - Stuck-on, waxy, tan-brown papules and/or plaques  - Benign-appearing - Discussed benign etiology and prognosis. - Observe - Call for any changes  Melanocytic Nevi - Tan-brown and/or pink-flesh-colored symmetric macules and papules - Benign appearing on exam today - Observation - Call clinic for new or changing moles - Recommend daily use of broad spectrum spf 30+ sunscreen to sun-exposed areas.   Hemangiomas - Red papules - Discussed benign nature - Observe - Call for any changes  Actinic Damage - Chronic condition, secondary to cumulative UV/sun exposure - diffuse scaly erythematous macules with underlying dyspigmentation - Recommend daily broad spectrum sunscreen SPF 30+ to sun-exposed areas, reapply every 2 hours as needed.  - Staying in the shade or wearing long sleeves, sun glasses (UVA+UVB protection) and wide brim hats (4-inch brim around the entire circumference of the hat) are also recommended for sun protection.  - Call for new or changing lesions.  History of Melanoma - No evidence of recurrence today - No lymphadenopathy - Recommend regular full body skin exams - Recommend daily broad spectrum sunscreen SPF 30+ to sun-exposed areas, reapply every 2 hours as  needed.  - Call if any new or changing lesions are noted between office visits  Skin cancer screening performed today.  Return in about 1 year (around 01/10/2022) for TBSE.  Luther Redo, CMA, am acting as scribe for Sarina Ser, MD  .  Documentation: I have reviewed the above documentation for accuracy and completeness, and I agree with the above.  Sarina Ser, MD

## 2021-01-10 NOTE — Patient Instructions (Signed)

## 2021-01-11 ENCOUNTER — Encounter: Payer: Self-pay | Admitting: Dermatology

## 2021-01-28 ENCOUNTER — Telehealth: Payer: Self-pay | Admitting: Primary Care

## 2021-01-28 NOTE — Telephone Encounter (Signed)
Called and spoke to patient, who is requesting HST results from 12/13/2020.  Beth, please advise. Thanks

## 2021-01-29 NOTE — Telephone Encounter (Signed)
Patient is willing to try a mychart visit, but requested that appt be changed to a Tuesday so his wife could help. Appt rescheduled to 02/19/2021 at 10:30. Nothing further needed at this time.

## 2021-01-29 NOTE — Telephone Encounter (Signed)
I had wanted him to follow-up in 4-6 weeks. He has severe OSA, needs virtual visit to review results and treatment options

## 2021-01-29 NOTE — Telephone Encounter (Signed)
Rov scheduled for 02/14/2021 a t 10:00.  Patient prefers phone visit.   Beth, please advise if okay to do phone vs video ?

## 2021-01-29 NOTE — Telephone Encounter (Signed)
Video if he has mychart or smart phone- we can send him a link through text message

## 2021-01-31 ENCOUNTER — Encounter: Payer: Self-pay | Admitting: Internal Medicine

## 2021-02-14 ENCOUNTER — Telehealth: Payer: Commercial Managed Care - PPO | Admitting: Primary Care

## 2021-02-18 NOTE — Progress Notes (Signed)
Virtual Visit via Video Note  I connected with Cody Pearson on 02/18/21 at 10:30 AM EDT by a video enabled telemedicine application and verified that I am speaking with the correct person using two identifiers.  Location: Patient: Home Provider: Office    I discussed the limitations of evaluation and management by telemedicine and the availability of in person appointments. The patient expressed understanding and agreed to proceed.  History of Present Illness: 69 year old, never smoked. PMH significant for hyperlipidemia, snoring, borderline diabetes. Patient of Dr. Halford Chessman, seen on 12/05/20 for sleep consults.   Previous [facility] pulmonary encounter:  12/05/2020 Patient presents today for sleep consults. He reports symptoms of snoring, restless sleep and witness apnea. No prior sleep study. He goes to bed between 10:30-11pm and gets out of bed at 6am. He wakes up three times at night to use restroom. His weight is up 10-20lbs. Goal would be around 190-200lbs. He had symptoms prior to weight gain. His son has sleep apnea.   Sleep questionnaire Prior sleep study- None Symptoms- Restless sleep, witness apnea, loud snoring  Bedtime-10:30-11pm Nocturnal awakenings- 3 Time out of bed in morning- 6am Epworth score- 3  02/19/2021- Interim hx  Patient contacted today to review sleep study results. Patient continues to have symptoms of apnea which has been waking him up at night. He had a home sleep study on 12/13/20 that showed severe OSA, average AHI 35.9 with SpO2 low 77%. We reviewed sleep study results and discussed treatment options including weight loss, side sleeping position, CPAP or referral to ENT for possible surgical options. Due to severity of his sleep apnea recommending he be started on CPAP therapy, he is agreeing to proceed with this. 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   Observations/Objective:  - Able to speak in full sentences; no overt shortness of breath  or wheezing  Assessment and Plan:  Severe OSA: - HST 12/13/20 >> AHI 35.9 with SpO2 low 77% - Start auto CPAP 5-20cm h20 with mask of choice - Advised he aim to wear CPAP every night for min 4-6 hours or longer - Recommend he work on weight loss and focus on side sleeping position  - FU 31-90 days after receiving CPAP device for compliance check   Follow Up Instructions:  - 3 month recall placed    I discussed the assessment and treatment plan with the patient. The patient was provided an opportunity to ask questions and all were answered. The patient agreed with the plan and demonstrated an understanding of the instructions.   The patient was advised to call back or seek an in-person evaluation if the symptoms worsen or if the condition fails to improve as anticipated.  I provided 22 minutes of non-face-to-face time during this encounter.   Martyn Ehrich, NP

## 2021-02-19 ENCOUNTER — Encounter: Payer: Self-pay | Admitting: Primary Care

## 2021-02-19 ENCOUNTER — Ambulatory Visit (INDEPENDENT_AMBULATORY_CARE_PROVIDER_SITE_OTHER): Payer: Commercial Managed Care - PPO | Admitting: Primary Care

## 2021-02-19 ENCOUNTER — Other Ambulatory Visit: Payer: Self-pay

## 2021-02-19 VITALS — Ht 68.0 in

## 2021-02-19 DIAGNOSIS — G4730 Sleep apnea, unspecified: Secondary | ICD-10-CM | POA: Diagnosis not present

## 2021-02-19 DIAGNOSIS — R0683 Snoring: Secondary | ICD-10-CM | POA: Diagnosis not present

## 2021-02-19 NOTE — Patient Instructions (Signed)
Home sleep study showed severe sleep apnea, due to severity recommending starting you on CPAP  Aim to wear CPAP every night for minimum 4-6 hours or longer Work on weight loss and focus on side sleeping position while not wearing CPAP   Order: Auto CPAP 5-20cm h20, mask of choice  Follow-up: 31-90 days after starting CPAP   CPAP and BPAP Information CPAP and BPAP (also called BiPAP) are methods that use air pressure to keep your airways open and to help you breathe well. CPAP and BPAP use different amounts of pressure. Your health care provider will tell you whether CPAP or BPAP would be more helpful for you. CPAP stands for "continuous positive airway pressure." With CPAP, the amount of pressure stays the same while you breathe in (inhale) and out (exhale). BPAP stands for "bi-level positive airway pressure." With BPAP, the amount of pressure will be higher when you inhale and lower when you exhale. This allows you to take larger breaths. CPAP or BPAP may be used in the hospital, or your health care provider may want you to use it at home. You may need to have a sleep study before your healthcare provider can order a machine for you to use at home. What are the advantages? CPAP or BPAP can be helpful if you have: Sleep apnea. Chronic obstructive pulmonary disease (COPD). Heart failure. Medical conditions that cause muscle weakness, including muscular dystrophy or amyotrophic lateral sclerosis (ALS). Other problems that cause breathing to be shallow, weak, abnormal, or difficult. CPAP and BPAP are most commonly used for obstructive sleep apnea (OSA) to keepthe airways from collapsing when the muscles relax during sleep. What are the risks? Generally, this is a safe treatment. However, problems may occur, including: Irritated skin or skin sores if the mask does not fit properly. Dry or stuffy nose or nosebleeds. Dry mouth. Feeling gassy or bloated. Sinus or lung infection if the  equipment is not cleaned properly. When should CPAP or BPAP be used? In most cases, the mask only needs to be worn during sleep. Generally, the mask needs to be worn throughout the night and during any daytime naps. People with certain medical conditions may also need to wear the mask at other times, such as when they are awake. Follow instructions from your health care providerabout when to use the machine. What happens during CPAP or BPAP?  Both CPAP and BPAP are provided by a small machine with a flexible plastic tube that attaches to a plastic mask that you wear. Air is blown through the mask into your nose or mouth. The amount of pressure that is used to blow the air can be adjusted on the machine. Your health care provider will set the pressuresetting and help you find the best mask for you. Tips for using the mask Because the mask needs to be snug, some people feel trapped or closed-in (claustrophobic) when first using the mask. If you feel this way, you may need to get used to the mask. One way to do this is to hold the mask loosely over your nose or mouth and then gradually apply the mask more snugly. You can also gradually increase the amount of time that you use the mask. Masks are available in various types and sizes. If your mask does not fit well, talk with your health care provider about getting a different one. Some common types of masks include: Full face masks, which fit over the mouth and nose. Nasal masks, which fit over  the nose. Nasal pillow or prong masks, which fit into the nostrils. If you are using a mask that fits over your nose and you tend to breathe through your mouth, a chin strap may be applied to help keep your mouth closed. Use a skin barrier to protect your skin as told by your health care provider. Some CPAP and BPAP machines have alarms that may sound if the mask comes off or develops a leak. If you have trouble with the mask, it is very important that you talk  with your health care provider about finding a way to make the mask easier to tolerate. Do not stop using the mask. There could be a negative impact on your health if you stop using the mask. Tips for using the machine Place your CPAP or BPAP machine on a secure table or stand near an electrical outlet. Know where the on/off switch is on the machine. Follow instructions from your health care provider about how to set the pressure on your machine and when you should use it. Do not eat or drink while the CPAP or BPAP machine is on. Food or fluids could get pushed into your lungs by the pressure of the CPAP or BPAP. For home use, CPAP and BPAP machines can be rented or purchased through home health care companies. Many different brands of machines are available. Renting a machine before purchasing may help you find out which particular machine works well for you. Your health insurance company may also decide which machine you may get. Keep the CPAP or BPAP machine and attachments clean. Ask your health care provider for specific instructions. Check the humidifier if you have a dry stuffy nose or nosebleeds. Make sure it is working correctly. Follow these instructions at home: Take over-the-counter and prescription medicines only as told by your health care provider. Ask if you can take sinus medicine if your sinuses are blocked. Do not use any products that contain nicotine or tobacco. These products include cigarettes, chewing tobacco, and vaping devices, such as e-cigarettes. If you need help quitting, ask your health care provider. Keep all follow-up visits. This is important. Contact a health care provider if: You have redness or pressure sores on your head, face, mouth, or nose from the mask or head gear. You have trouble using the CPAP or BPAP machine. You cannot tolerate wearing the CPAP or BPAP mask. Someone tells you that you snore even when wearing your CPAP or BPAP. Get help right away  if: You have trouble breathing. You feel confused. Summary CPAP and BPAP are methods that use air pressure to keep your airways open and to help you breathe well. If you have trouble with the mask, it is very important that you talk with your health care provider about finding a way to make the mask easier to tolerate. Do not stop using the mask. There could be a negative impact to your health if you stop using the mask. Follow instructions from your health care provider about when to use the machine. This information is not intended to replace advice given to you by your health care provider. Make sure you discuss any questions you have with your healthcare provider. Document Revised: 07/06/2020 Document Reviewed: 07/06/2020 Elsevier Patient Education  2022 [employer].

## 2021-02-19 NOTE — Progress Notes (Signed)
Reviewed and agree with assessment/plan.   Chesley Mires, MD [facility] Pulmonary/Critical Care 02/19/2021, 1:02 PM Pager:  [phone]

## 2021-03-14 ENCOUNTER — Ambulatory Visit (AMBULATORY_SURGERY_CENTER): Payer: Self-pay | Admitting: *Deleted

## 2021-03-14 ENCOUNTER — Other Ambulatory Visit: Payer: Self-pay

## 2021-03-14 VITALS — Ht 69.0 in | Wt 220.0 lb

## 2021-03-14 DIAGNOSIS — Z86010 Personal history of colonic polyps: Secondary | ICD-10-CM

## 2021-03-14 MED ORDER — NA SULFATE-K SULFATE-MG SULF 17.5-3.13-1.6 GM/177ML PO SOLN: 1.0000 | Freq: Once | ORAL | 0 refills | Status: AC

## 2021-03-14 NOTE — Progress Notes (Signed)
  No trouble with anesthesia except spinal block,  denies being told they were difficult to intubate or trouble moving neck, or hx/fam hx of malignant hyperthermia per pt   No egg or soy allergy  No home oxygen use   No medications for weight loss taken  Pt denies constipation issues  Pt informed that we do not do prior authorizations for prep

## 2021-03-27 ENCOUNTER — Ambulatory Visit (AMBULATORY_SURGERY_CENTER): Payer: Commercial Managed Care - PPO | Admitting: Internal Medicine

## 2021-03-27 ENCOUNTER — Encounter: Payer: Self-pay | Admitting: Internal Medicine

## 2021-03-27 ENCOUNTER — Other Ambulatory Visit: Payer: Self-pay

## 2021-03-27 VITALS — BP 114/72 | HR 73 | Temp 97.3°F | Resp 14 | Ht 69.0 in | Wt 220.0 lb

## 2021-03-27 DIAGNOSIS — Z86010 Personal history of colonic polyps: Secondary | ICD-10-CM | POA: Diagnosis not present

## 2021-03-27 DIAGNOSIS — D123 Benign neoplasm of transverse colon: Secondary | ICD-10-CM | POA: Diagnosis not present

## 2021-03-27 MED ORDER — SODIUM CHLORIDE 0.9 % IV SOLN: 500.0000 mL | Freq: Once | INTRAVENOUS | Status: DC

## 2021-03-27 NOTE — Op Note (Signed)
[place] Patient Name: Cody Pearson Procedure Date: 03/27/2021 3:13 PM MRN: [id number] Endoscopist: Jerene Bears , MD Age: 70 Referring MD:  Date of Birth: [birth year] Gender: Male Account #: [id number] Procedure:                Colonoscopy Indications:              High risk colon cancer surveillance: Personal                            history of sessile serrated colon polyps (less than                            10 mm in size) with no dysplasia, Last colonoscopy:                            January 2015 (3 SSPs at last exam) Medicines:                Monitored Anesthesia Care Procedure:                Pre-Anesthesia Assessment:                           - Prior to the procedure, a History and Physical                            was performed, and patient medications and                            allergies were reviewed. The patient's tolerance of                            previous anesthesia was also reviewed. The risks                            and benefits of the procedure and the sedation                            options and risks were discussed with the patient.                            All questions were answered, and informed consent                            was obtained. Prior Anticoagulants: The patient has                            taken no previous anticoagulant or antiplatelet                            agents. ASA Grade Assessment: II - A patient with                            mild systemic disease. After reviewing the risks  and benefits, the patient was deemed in                            satisfactory condition to undergo the procedure.                           After obtaining informed consent, the colonoscope                            was passed under direct vision. Throughout the                            procedure, the patient's blood pressure, pulse, and                            oxygen saturations were  monitored continuously. The                            Olympus CF-HQ190L (Serial# [id number]) Colonoscope was                            introduced through the anus and advanced to the                            cecum, identified by appendiceal orifice and                            ileocecal valve. The colonoscopy was performed                            without difficulty. The patient tolerated the                            procedure well. The quality of the bowel                            preparation was good. The ileocecal valve,                            appendiceal orifice, and rectum were photographed. Scope In: 3:22:34 PM Scope Out: 3:35:42 PM Scope Withdrawal Time: 0 hours 7 minutes 2 seconds  Total Procedure Duration: 0 hours 13 minutes 8 seconds  Findings:                 The digital rectal exam was normal.                           A 5 mm polyp was found in the transverse colon. The                            polyp was sessile. The polyp was removed with a                            cold snare. Resection and retrieval were complete.  Internal hemorrhoids were found during                            retroflexion. The hemorrhoids were small.                           The exam was otherwise without abnormality. Complications:            No immediate complications. Estimated Blood Loss:     Estimated blood loss: none. Impression:               - One 5 mm polyp in the transverse colon, removed                            with a cold snare. Resected and retrieved.                           - Internal hemorrhoids.                           - The examination was otherwise normal. Recommendation:           - Patient has a contact number available for                            emergencies. The signs and symptoms of potential                            delayed complications were discussed with the                            patient. Return to normal activities  tomorrow.                            Written discharge instructions were provided to the                            patient.                           - Resume previous diet.                           - Continue present medications.                           - Await pathology results.                           - Repeat colonoscopy is recommended for                            surveillance. The colonoscopy date will be                            determined after pathology results from today's  exam become available for review. Jerene Bears, MD 03/27/2021 3:39:51 PM This report has been signed electronically.

## 2021-03-27 NOTE — Progress Notes (Signed)
Called to room to assist during endoscopic procedure.  Patient ID and intended procedure confirmed with present staff. Received instructions for my participation in the procedure from the performing physician.  

## 2021-03-27 NOTE — Progress Notes (Signed)
Pt's states no medical or surgical changes since previsit or office visit. VS assessed by AS 

## 2021-03-27 NOTE — Patient Instructions (Signed)
Handouts given for polyps and hemorrhoids.  Await pathology results.  YOU HAD AN ENDOSCOPIC PROCEDURE TODAY AT [facility]:   Refer to the procedure report that was given to you for any specific questions about what was found during the examination.  If the procedure report does not answer your questions, please call your gastroenterologist to clarify.  If you requested that your care partner not be given the details of your procedure findings, then the procedure report has been included in a sealed envelope for you to review at your convenience later.  YOU SHOULD EXPECT: Some feelings of bloating in the abdomen. Passage of more gas than usual.  Walking can help get rid of the air that was put into your GI tract during the procedure and reduce the bloating. If you had a lower endoscopy (such as a colonoscopy or flexible sigmoidoscopy) you may notice spotting of blood in your stool or on the toilet paper. If you underwent a bowel prep for your procedure, you may not have a normal bowel movement for a few days.  Please Note:  You might notice some irritation and congestion in your nose or some drainage.  This is from the oxygen used during your procedure.  There is no need for concern and it should clear up in a day or so.  SYMPTOMS TO REPORT IMMEDIATELY:  Following lower endoscopy (colonoscopy or flexible sigmoidoscopy):  Excessive amounts of blood in the stool  Significant tenderness or worsening of abdominal pains  Swelling of the abdomen that is new, acute  Fever of 100F or higher For urgent or emergent issues, a gastroenterologist can be reached at any hour by calling [phone]. Do not use MyChart messaging for urgent concerns.    DIET:  We do recommend a small meal at first, but then you may proceed to your regular diet.  Drink plenty of fluids but you should avoid alcoholic beverages for 24 hours.  ACTIVITY:  You should plan to take it easy for the rest of today and  you should NOT DRIVE or use heavy machinery until tomorrow (because of the sedation medicines used during the test).    FOLLOW UP: Our staff will call the number listed on your records 48-72 hours following your procedure to check on you and address any questions or concerns that you may have regarding the information given to you following your procedure. If we do not reach you, we will leave a message.  We will attempt to reach you two times.  During this call, we will ask if you have developed any symptoms of COVID 19. If you develop any symptoms (ie: fever, flu-like symptoms, shortness of breath, cough etc.) before then, please call [phone].  If you test positive for Covid 19 in the 2 weeks post procedure, please call and report this information to [place].    If any biopsies were taken you will be contacted by phone or by letter within the next 1-3 weeks.  Please call us at [phone] if you have not heard about the biopsies in 3 weeks.    SIGNATURES/CONFIDENTIALITY: You and/or your care partner have signed paperwork which will be entered into your electronic medical record.  These signatures attest to the fact that that the information above on your After Visit Summary has been reviewed and is understood.  Full responsibility of the confidentiality of this discharge information lies with you and/or your care-partner.

## 2021-03-27 NOTE — Progress Notes (Signed)
A and O x3. Report to RN. Tolerated MAC anesthesia well. 

## 2021-03-27 NOTE — Progress Notes (Signed)
GASTROENTEROLOGY PROCEDURE H&P NOTE   Primary Care Physician: Pleas Koch, NP    Reason for Procedure:   Polyp surveillance  Plan:    colonoscopy  Patient is appropriate for endoscopic procedure(s) in the ambulatory ([place]) setting.  The nature of the procedure, as well as the risks, benefits, and alternatives were carefully and thoroughly reviewed with the patient. Ample time for discussion and questions allowed. The patient understood, was satisfied, and agreed to proceed.     HPI: Cody Pearson is a 70 y.o. male who presents for surveillance colonoscopy.  Medical history as below.  No issues or complaints today including no abd pain, chest pain or dyspnea.  Past Medical History:  Diagnosis Date   Actinic keratosis 03/28/2020   Arthritis    Cancer ([place])    melanoma   History of colonic polyps    Hyperlipidemia    Melanoma ([place]) ~2017   scalp, txted at [place] apnea    hasn't started using CPAP yet    Past Surgical History:  Procedure Laterality Date   COLONOSCOPY     KNEE ARTHROSCOPY Right    MEDIAL PARTIAL KNEE REPLACEMENT Right    PARTIAL KNEE ARTHROPLASTY Right 10/13/2017   Procedure: UNICOMPARTMENTAL KNEE;  Surgeon: Corky Mull, MD;  Location: [facility] ORS;  Service: Orthopedics;  Laterality: Right;    Prior to Admission medications   Medication Sig Start Date End Date Taking? Authorizing Provider  atorvastatin (LIPITOR) 20 MG tablet TAKE 1 TABLET(20 MG) BY MOUTH DAILY FOR CHOLESTEROL 04/25/20  Yes Pleas Koch, NP  Testosterone 20 % CREA by Does not apply route.   Yes [provider]  acetaminophen (TYLENOL) 650 MG CR tablet Take 650 mg by mouth every 8 (eight) hours as needed for pain. Patient not taking: Reported on 03/27/2021    [provider]  ciclopirox (LOPROX) 0.77 % SUSP Apply 1 application topically at bedtime. Patient not taking: Reported on 03/27/2021 10/09/20   Ralene Bathe, MD  CLINDAMYCIN HCL PO Take  by mouth. Prior to dental procedures Patient not taking: Reported on 03/27/2021    [provider]  ketoconazole (NIZORAL) 2 % cream APPLY TO BOTH FEET, BETWEEN TOES EVERY NIGHT AT BEDTIME Patient not taking: Reported on 03/27/2021 10/08/20   Brendolyn Patty, MD  Multiple Vitamins-Minerals (CENTRUM SILVER PO) Take 1 tablet by mouth daily. Patient not taking: No sig reported    [provider]  naproxen sodium (ANAPROX) 220 MG tablet Take 440 mg by mouth 2 (two) times daily.  Patient not taking: No sig reported    [provider]  tadalafil (CIALIS) 5 MG tablet Take 5 mg by mouth daily. Patient not taking: Reported on 03/27/2021    [provider]  Tavaborole (KERYDIN) 5 % SOLN Apply 1 application topically daily. Patient not taking: Reported on 03/27/2021 12/21/19   Ralene Bathe, MD    Current Outpatient Medications  Medication Sig Dispense Refill   atorvastatin (LIPITOR) 20 MG tablet TAKE 1 TABLET(20 MG) BY MOUTH DAILY FOR CHOLESTEROL 90 tablet 3   Testosterone 20 % CREA by Does not apply route.     acetaminophen (TYLENOL) 650 MG CR tablet Take 650 mg by mouth every 8 (eight) hours as needed for pain. (Patient not taking: Reported on 03/27/2021)     ciclopirox (LOPROX) 0.77 % SUSP Apply 1 application topically at bedtime. (Patient not taking: Reported on 03/27/2021) 4 mL 2   CLINDAMYCIN HCL PO Take by  mouth. Prior to dental procedures (Patient not taking: Reported on 03/27/2021)     ketoconazole (NIZORAL) 2 % cream APPLY TO BOTH FEET, BETWEEN TOES EVERY NIGHT AT BEDTIME (Patient not taking: Reported on 03/27/2021) 60 g 1   Multiple Vitamins-Minerals (CENTRUM SILVER PO) Take 1 tablet by mouth daily. (Patient not taking: No sig reported)     naproxen sodium (ANAPROX) 220 MG tablet Take 440 mg by mouth 2 (two) times daily.  (Patient not taking: No sig reported)     tadalafil (CIALIS) 5 MG tablet Take 5 mg by mouth daily. (Patient not taking: Reported on 03/27/2021)      Tavaborole (KERYDIN) 5 % SOLN Apply 1 application topically daily. (Patient not taking: Reported on 03/27/2021) 4 mL 1   Current Facility-Administered Medications  Medication Dose Route Frequency Provider Last Rate Last Admin   0.9 %  sodium chloride infusion  500 mL Intravenous Once Jahrell Pearson, Cody Lines, MD        Allergies as of 03/27/2021 - Review Complete 03/27/2021  Allergen Reaction Noted   Penicillins  03/14/2021   Amoxicillin Nausea Only 03/10/2018    Family History  Problem Relation Age of Onset   Aneurysm Paternal Grandmother    Colon cancer Neg Hx    Esophageal cancer Neg Hx    Rectal cancer Neg Hx    Stomach cancer Neg Hx     Social History   Socioeconomic History   Marital status: Married    Spouse name: Not on file   Number of children: 3   Years of education: Not on file   Highest education level: Not on file  Occupational History   Occupation: Maintenance    Employer: [employer]   Tobacco Use   Smoking status: Never   Smokeless tobacco: Never  Vaping Use   Vaping Use: Never used  Substance and Sexual Activity   Alcohol use: Yes    Comment: wine occ   Drug use: No   Sexual activity: Not on file  Other Topics Concern   Not on file  Social History Narrative   Works at [employer] with maintenance group.   Highest level of education 12th grade.   Married.   Lives locally.   Social Determinants of Health   Financial Resource Strain: Not on file  Food Insecurity: Not on file  Transportation Needs: Not on file  Physical Activity: Not on file  Stress: Not on file  Social Connections: Not on file  Intimate Partner Violence: Not on file    Physical Exam: Vital signs in last 24 hours: '@BP'$  112/78   Pulse 67   Temp (!) 97.3 F (36.3 C) (Skin)   Ht '5\' 9"'$  (1.753 m)   Wt 220 lb (99.8 kg)   SpO2 97%   BMI 32.49 kg/m  GEN: NAD EYE: Sclerae anicteric ENT: MMM CV: Non-tachycardic Pulm: CTA b/l GI: Soft, NT/ND NEURO:  Alert & Oriented x  3   Zenovia Jarred, MD [facility]  03/27/2021 3:14 PM

## 2021-03-29 ENCOUNTER — Telehealth: Payer: Self-pay | Admitting: *Deleted

## 2021-03-29 NOTE — Telephone Encounter (Signed)
  Follow up Call-  Call back number 03/27/2021  Post procedure Call Back phone  # [phone]  Permission to leave phone message Yes  Some recent data might be hidden     Patient questions:  Do you have a fever, pain , or abdominal swelling? No. Pain Score  0 *  Have you tolerated food without any problems? Yes.    Have you been able to return to your normal activities? Yes.    Do you have any questions about your discharge instructions: Diet   No. Medications  No. Follow up visit  No.  Do you have questions or concerns about your Care? No.  Actions: * If pain score is 4 or above: No action needed, pain <4.  Have you developed a fever since your procedure? no  2.   Have you had an respiratory symptoms (SOB or cough) since your procedure? no  3.   Have you tested positive for COVID 19 since your procedure no  4.   Have you had any family members/close contacts diagnosed with the COVID 19 since your procedure?  no   If yes to any of these questions please route to Joylene John, RN and Joella Prince, RN

## 2021-04-02 ENCOUNTER — Encounter: Payer: Self-pay | Admitting: Internal Medicine

## 2021-05-15 DIAGNOSIS — N401 Enlarged prostate with lower urinary tract symptoms: Secondary | ICD-10-CM | POA: Diagnosis not present

## 2021-05-15 DIAGNOSIS — Z79899 Other long term (current) drug therapy: Secondary | ICD-10-CM | POA: Diagnosis not present

## 2021-05-15 DIAGNOSIS — E291 Testicular hypofunction: Secondary | ICD-10-CM | POA: Diagnosis not present

## 2021-06-06 DIAGNOSIS — Z23 Encounter for immunization: Secondary | ICD-10-CM | POA: Diagnosis not present

## 2021-06-07 ENCOUNTER — Other Ambulatory Visit: Payer: Self-pay | Admitting: Primary Care

## 2021-06-07 DIAGNOSIS — E785 Hyperlipidemia, unspecified: Secondary | ICD-10-CM

## 2021-06-20 DIAGNOSIS — Z23 Encounter for immunization: Secondary | ICD-10-CM | POA: Diagnosis not present

## 2021-06-25 ENCOUNTER — Telehealth: Payer: Self-pay

## 2021-06-25 NOTE — Progress Notes (Signed)
@Patient  ID: Cody Pearson, male    DOB: 08/15/1950, 70 y.o.   MRN: [id number]  No chief complaint on file.   Referring provider: Pleas Koch, NP  HPI: 70 year old, never smoked. PMH significant for  OSA, hyperlipidemia, borderline diabetes. Patient of Dr. Halford Chessman, seen on 12/05/20 for sleep consult. Home sleep study on 12/13/20 showed severe OSA, average AHI 35.9/hour. DME company is [employer].   Previous [facility] pulmonary encounter:  12/05/2020 Patient presents today for sleep consults. He reports symptoms of snoring, restless sleep and witness apnea. No prior sleep study. He goes to bed between 10:30-11pm and gets out of bed at 6am. He wakes up three times at night to use restroom. His weight is up 10-20lbs. Goal would be around 190-200lbs. He had symptoms prior to weight gain. His son has sleep apnea.   Sleep questionnaire Prior sleep study- None Symptoms- Restless sleep, witness apnea, loud snoring  Bedtime-10:30-11pm Nocturnal awakenings- 3 Time out of bed in morning- 6am Epworth score- 3  02/19/2021 Patient contacted today to review sleep study results. Patient continues to have symptoms of apnea which has been waking him up at night. He had a home sleep study on 12/13/20 that showed severe OSA, average AHI 35.9 with SpO2 low 77%. We reviewed sleep study results and discussed treatment options including weight loss, side sleeping position, CPAP or referral to ENT for possible surgical options. Due to severity of his sleep apnea recommending he be started on CPAP therapy, he is agreeing to proceed with this. 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   06/26/2021- Interim hx  Patient presents today for 3-4 month follow-up after CPAP start. He is doing well, it took him a little bit of time to get used to wearing CPAP. No issues with mask or pressure setting. He is 100% complaint with use. He uses full face mask. He is sleeping though the night, wakes up on average 1-2  times a night to use restroom. He does feel somewhat more rested in the morning. He is still taking naps during the day, mostly on weekends. His wife is happier since he starting wearing CPAP as he is no longer snoring.   Airview download 05/27/21-06/25/21 Usage 30/30 days used (100%); 93% > 4 hours Average usage 7 hours 16 mins Pressure 5-20cm h20 (12.1cm h20- 95%) Airleaks 6.3L/min (95%) AHI 1.6   Allergies  Allergen Reactions   Penicillins     nausea   Amoxicillin Nausea Only    Nausea, anorexia, body aches    Immunization History  Administered Date(s) Administered   Fluad Quad(high Dose 65+) 05/25/2020   Influenza,inj,Quad PF,6+ Mos 05/12/2017   Moderna Sars-Covid-2 Vaccination 09/19/2019, 10/17/2019, 06/22/2020   Pneumococcal Conjugate-13 04/04/2019   Pneumococcal Polysaccharide-23 04/20/2020   Tdap 12/12/2010    Past Medical History:  Diagnosis Date   Actinic keratosis 03/28/2020   Arthritis    Cancer ([place])    melanoma   History of colonic polyps    Hyperlipidemia    Melanoma ([place]) ~2017   scalp, txted at [place] apnea    hasn't started using CPAP yet    Tobacco History: Social History   Tobacco Use  Smoking Status Never  Smokeless Tobacco Never   Counseling given: Not Answered   Outpatient Medications Prior to Visit  Medication Sig Dispense Refill   acetaminophen (TYLENOL) 650 MG CR tablet Take 650 mg by mouth every 8 (eight) hours as needed for  pain.     atorvastatin (LIPITOR) 20 MG tablet Take 1 tablet (20 mg total) by mouth daily. For cholesterol. Office visit required for further refills. 30 tablet 0   ciclopirox (LOPROX) 0.77 % SUSP Apply 1 application topically at bedtime. 4 mL 2   CLINDAMYCIN HCL PO Take by mouth. Prior to dental procedures     ketoconazole (NIZORAL) 2 % cream APPLY TO BOTH FEET, BETWEEN TOES EVERY NIGHT AT BEDTIME 60 g 1   Multiple Vitamins-Minerals (CENTRUM SILVER PO) Take 1 tablet by mouth daily.     naproxen sodium  (ANAPROX) 220 MG tablet Take 440 mg by mouth 2 (two) times daily.     tadalafil (CIALIS) 5 MG tablet Take 5 mg by mouth daily.     Tavaborole (KERYDIN) 5 % SOLN Apply 1 application topically daily. 4 mL 1   Testosterone 20 % CREA by Does not apply route.     No facility-administered medications prior to visit.   Review of Systems  Review of Systems  Constitutional: Negative.  Negative for fatigue.  Respiratory: Negative.    Psychiatric/Behavioral:  Negative for sleep disturbance.     Physical Exam  BP 116/70 (BP Location: Left Arm, Patient Position: Sitting, Cuff Size: Normal)   Pulse 73   Temp (!) 97.5 F (36.4 C) (Oral)   Ht 5\' 9"  (1.753 m)   Wt 232 lb 3.2 oz (105.3 kg)   SpO2 95%   BMI 34.29 kg/m  Physical Exam Constitutional:      Appearance: Normal appearance.  HENT:     Head: Normocephalic and atraumatic.     Mouth/Throat:     Mouth: Mucous membranes are moist.     Pharynx: Oropharynx is clear.  Cardiovascular:     Rate and Rhythm: Normal rate and regular rhythm.  Pulmonary:     Effort: Pulmonary effort is normal.     Breath sounds: Normal breath sounds. No wheezing, rhonchi or rales.  Musculoskeletal:        General: Normal range of motion.  Skin:    General: Skin is warm and dry.  Neurological:     General: No focal deficit present.     Mental Status: He is alert and oriented to person, place, and time. Mental status is at baseline.  Psychiatric:        Mood and Affect: Mood normal.        Behavior: Behavior normal.        Thought Content: Thought content normal.        Judgment: Judgment normal.     Lab Results:  CBC    Component Value Date/Time   WBC 4.5 04/20/2020 0823   RBC 5.12 04/20/2020 0823   HGB 16.1 04/20/2020 0823   HCT 47.6 04/20/2020 0823   PLT 173.0 04/20/2020 0823   MCV 92.9 04/20/2020 0823   MCH 31.1 10/14/2017 0301   MCHC 33.8 04/20/2020 0823   RDW 13.8 04/20/2020 0823   LYMPHSABS 0.6 (L) 10/14/2017 0301   MONOABS 1.1 (H)  10/14/2017 0301   EOSABS 0.0 10/14/2017 0301   BASOSABS 0.0 10/14/2017 0301    BMET    Component Value Date/Time   NA 141 04/20/2020 0823   K 4.7 04/20/2020 0823   CL 106 04/20/2020 0823   CO2 30 04/20/2020 0823   GLUCOSE 103 (H) 04/20/2020 0823   BUN 13 04/20/2020 0823   CREATININE 1.21 04/20/2020 0823   CALCIUM 9.1 04/20/2020 0823   GFRNONAA >60 10/14/2017 0301   GFRAA >  60 10/14/2017 0301    BNP No results found for: BNP  ProBNP No results found for: PROBNP  Imaging: No results found.   Assessment & Plan:   OSA on CPAP - Home sleep study on 12/13/20 showed severe OSA, average AHI 35.9/hour  - Patient is 93% compliant with CPAP use > 4 hours and reports benefit from use - Current pressure 5-20cm h20; Residual AHI 1.6/hr  - Plan to lower pressure setting 5-15cm h20, DME order sent to [employer]  - Encourage patient continue to wear CPAP every night for 4-6 hours or longer - Advised against driving if experiencing excessive daytime sleepiness and work on weight loss efforts  - Follow-up in 6 months or sooner if needed    Martyn Ehrich, NP 06/26/2021

## 2021-06-25 NOTE — Telephone Encounter (Signed)
Unable to reach patient in regards to upcoming appt, need patient to bring sd card in tomorrow but unable to reach him.

## 2021-06-26 ENCOUNTER — Ambulatory Visit (INDEPENDENT_AMBULATORY_CARE_PROVIDER_SITE_OTHER): Payer: Medicare Other | Admitting: Primary Care

## 2021-06-26 ENCOUNTER — Encounter: Payer: Self-pay | Admitting: Primary Care

## 2021-06-26 ENCOUNTER — Other Ambulatory Visit: Payer: Self-pay

## 2021-06-26 VITALS — BP 116/70 | HR 73 | Temp 97.5°F | Ht 69.0 in | Wt 232.2 lb

## 2021-06-26 DIAGNOSIS — Z9989 Dependence on other enabling machines and devices: Secondary | ICD-10-CM | POA: Diagnosis not present

## 2021-06-26 DIAGNOSIS — G4730 Sleep apnea, unspecified: Secondary | ICD-10-CM | POA: Diagnosis not present

## 2021-06-26 DIAGNOSIS — G4733 Obstructive sleep apnea (adult) (pediatric): Secondary | ICD-10-CM | POA: Diagnosis not present

## 2021-06-26 NOTE — Patient Instructions (Addendum)
Home sleep study on 12/13/20 that showed severe OSA, average AHI 35.9/hour  When wearing your CPAP you apnea score is down to 1.6/hour   Recommendations: Continue to wear CPAP every night for 4-6 hours or longer Do not drive if experiencing excessive daytime sleepiness  Maintained healthy weight range   Orders: Change CPAP pressure 5-15cm h20 (from 5-20)  Follow-up: 6 month visit with Eustaquio Maize NP

## 2021-06-26 NOTE — Progress Notes (Signed)
Reviewed and agree with assessment/plan.   Chesley Mires, MD [facility] Pulmonary/Critical Care 06/26/2021, 10:23 AM Pager:  [phone]

## 2021-06-26 NOTE — Assessment & Plan Note (Addendum)
-   Home sleep study on 12/13/20 showed severe OSA, average AHI 35.9/hour  - Patient is 93% compliant with CPAP use > 4 hours and reports benefit from use - Current pressure 5-20cm h20 (12.1cm h20- 95%); Residual AHI 1.6/hr  - Plan to lower pressure setting 5-15cm h20, DME order sent to [employer]  - Encourage patient continue to wear CPAP every night for 4-6 hours or longer - Advised against driving if experiencing excessive daytime sleepiness and work on weight loss efforts  - Follow-up in 6 months or sooner if needed

## 2021-07-20 ENCOUNTER — Other Ambulatory Visit: Payer: Self-pay | Admitting: Primary Care

## 2021-07-20 DIAGNOSIS — E785 Hyperlipidemia, unspecified: Secondary | ICD-10-CM

## 2021-07-30 ENCOUNTER — Encounter: Payer: Self-pay | Admitting: Primary Care

## 2021-07-30 ENCOUNTER — Other Ambulatory Visit: Payer: Self-pay

## 2021-07-30 ENCOUNTER — Ambulatory Visit (INDEPENDENT_AMBULATORY_CARE_PROVIDER_SITE_OTHER): Payer: Medicare Other | Admitting: Primary Care

## 2021-07-30 DIAGNOSIS — R7303 Prediabetes: Secondary | ICD-10-CM | POA: Diagnosis not present

## 2021-07-30 DIAGNOSIS — N529 Male erectile dysfunction, unspecified: Secondary | ICD-10-CM | POA: Diagnosis not present

## 2021-07-30 DIAGNOSIS — R3912 Poor urinary stream: Secondary | ICD-10-CM

## 2021-07-30 DIAGNOSIS — G4733 Obstructive sleep apnea (adult) (pediatric): Secondary | ICD-10-CM

## 2021-07-30 DIAGNOSIS — Z9989 Dependence on other enabling machines and devices: Secondary | ICD-10-CM

## 2021-07-30 DIAGNOSIS — N401 Enlarged prostate with lower urinary tract symptoms: Secondary | ICD-10-CM | POA: Diagnosis not present

## 2021-07-30 DIAGNOSIS — E785 Hyperlipidemia, unspecified: Secondary | ICD-10-CM

## 2021-07-30 LAB — LIPID PANEL
Cholesterol: 139 mg/dL (ref 0–200)
HDL: 38.8 mg/dL — ABNORMAL LOW (ref >39.00)
LDL Cholesterol: 83 mg/dL (ref 0–99)
NonHDL: 100.15
Total CHOL/HDL Ratio: 4
Triglycerides: 85 mg/dL (ref 0.0–149.0)
VLDL: 17 mg/dL (ref 0.0–40.0)

## 2021-07-30 LAB — CBC
HCT: 48 % (ref 39.0–52.0)
Hemoglobin: 16 g/dL (ref 13.0–17.0)
MCHC: 33.4 g/dL (ref 30.0–36.0)
MCV: 94.2 fl (ref 78.0–100.0)
Platelets: 172 10*3/uL (ref 150.0–400.0)
RBC: 5.1 Mil/uL (ref 4.22–5.81)
RDW: 13.4 % (ref 11.5–15.5)
WBC: 4.2 10*3/uL (ref 4.0–10.5)

## 2021-07-30 LAB — COMPREHENSIVE METABOLIC PANEL
ALT: 25 U/L (ref 0–53)
AST: 22 U/L (ref 0–37)
Albumin: 4.3 g/dL (ref 3.5–5.2)
Alkaline Phosphatase: 50 U/L (ref 39–117)
BUN: 11 mg/dL (ref 6–23)
CO2: 30 mEq/L (ref 19–32)
Calcium: 9.6 mg/dL (ref 8.4–10.5)
Chloride: 104 mEq/L (ref 96–112)
Creatinine, Ser: 1.26 mg/dL (ref 0.40–1.50)
GFR: 58.39 mL/min — ABNORMAL LOW (ref >60.00)
Glucose, Bld: 104 mg/dL — ABNORMAL HIGH (ref 70–99)
Potassium: 4.7 mEq/L (ref 3.5–5.1)
Sodium: 139 mEq/L (ref 135–145)
Total Bilirubin: 0.6 mg/dL (ref 0.2–1.2)
Total Protein: 6.8 g/dL (ref 6.0–8.3)

## 2021-07-30 LAB — HEMOGLOBIN A1C: Hgb A1c MFr Bld: 5.7 % (ref 4.6–6.5)

## 2021-07-30 MED ORDER — ATORVASTATIN CALCIUM 20 MG PO TABS: 20.0000 mg | ORAL_TABLET | Freq: Every day | ORAL | 3 refills | Status: DC

## 2021-07-30 NOTE — Progress Notes (Signed)
Subjective:    Patient ID: Cody Pearson, male    DOB: 1950/08/24, 70 y.o.   MRN: [id number]  HPI  Cody Pearson is a very pleasant 70 y.o. male with a history of OSA on CPAP, BPH, prediabetes, erectile dysfunction, hyperlipidemia who presents today for follow up of chronic conditions and medication refills.  Managed on atorvastatin 20 mg for hyperlipidemia, compliant daily. He is due for labs today. He is needing refills, has been using his wife's atorvastatin.   Follows with Urology annually who follows PSA and prescribes tadalafil 5 mg daily for BPH and testosterone 20% cream daily. He endorses a recent visit a few months ago, had PSA checked.   Following with dermatology for tinea pedis, using ketoconazole PRN. Overall improved.   Immunizations: -Tetanus: 2012 -Influenza: Completed this season  -Covid-19: 3 vaccines -Shingles: Declines  -Pneumonia:  Prevnar 13 in 2020, pneumovax 23 in 2021   Diet: Steen.  Exercise: No regular exercise.  Eye exam: Completes annually  Dental exam: Completes semi-annually   Colonoscopy: Completed in 2022, due 2029 PSA: Due  BP Readings from Last 3 Encounters:  07/30/21 110/74  06/26/21 116/70  03/27/21 114/72       Review of Systems  Respiratory:  Negative for shortness of breath.   Cardiovascular:  Negative for chest pain.  Gastrointestinal:  Negative for constipation and diarrhea.  Neurological:  Negative for dizziness and headaches.  Psychiatric/Behavioral:  The patient is not nervous/anxious.         Past Medical History:  Diagnosis Date   Actinic keratosis 03/28/2020   Arthritis    Cancer ([place])    melanoma   History of colonic polyps    Hyperlipidemia    Melanoma ([place]) ~2017   scalp, txted at [place] apnea    hasn't started using CPAP yet    Social History   Socioeconomic History   Marital status: Married    Spouse name: Not on file   Number of children: 3   Years of education: Not on  file   Highest education level: Not on file  Occupational History   Occupation: Maintenance    Employer: [employer]   Tobacco Use   Smoking status: Never   Smokeless tobacco: Never  Vaping Use   Vaping Use: Never used  Substance and Sexual Activity   Alcohol use: Yes    Comment: wine occ   Drug use: No   Sexual activity: Not on file  Other Topics Concern   Not on file  Social History Narrative   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   Highest level of education 12th grade.   Married.   Lives locally.   Social Determinants of Health   Financial Resource Strain: Not on file  Food Insecurity: Not on file  Transportation Needs: Not on file  Physical Activity: Not on file  Stress: Not on file  Social Connections: Not on file  Intimate Partner Violence: Not on file    Past Surgical History:  Procedure Laterality Date   COLONOSCOPY     KNEE ARTHROSCOPY Right    MEDIAL PARTIAL KNEE REPLACEMENT Right    PARTIAL KNEE ARTHROPLASTY Right 10/13/2017   Procedure: UNICOMPARTMENTAL KNEE;  Surgeon: Corky Mull, MD;  Location: [facility] ORS;  Service: Orthopedics;  Laterality: Right;    Family History  Problem Relation Age of Onset   Aneurysm Paternal Grandmother    Colon cancer Neg Hx    Esophageal  cancer Neg Hx    Rectal cancer Neg Hx    Stomach cancer Neg Hx     Allergies  Allergen Reactions   Penicillins     nausea   Amoxicillin Nausea Only    Nausea, anorexia, body aches    Current Outpatient Medications on File Prior to Visit  Medication Sig Dispense Refill   acetaminophen (TYLENOL) 650 MG CR tablet Take 650 mg by mouth every 8 (eight) hours as needed for pain.     atorvastatin (LIPITOR) 20 MG tablet Take 1 tablet (20 mg total) by mouth daily. For cholesterol. Office visit required for further refills. 30 tablet 0   CLINDAMYCIN HCL PO Take by mouth. Prior to dental procedures     ketoconazole (NIZORAL) 2 % cream APPLY TO BOTH FEET, BETWEEN TOES EVERY  NIGHT AT BEDTIME 60 g 1   Multiple Vitamins-Minerals (CENTRUM SILVER PO) Take 1 tablet by mouth daily.     naproxen sodium (ANAPROX) 220 MG tablet Take 440 mg by mouth 2 (two) times daily.     tadalafil (CIALIS) 5 MG tablet Take 5 mg by mouth daily.     Tavaborole (KERYDIN) 5 % SOLN Apply 1 application topically daily. 4 mL 1   Testosterone 20 % CREA by Does not apply route.     ciclopirox (LOPROX) 0.77 % SUSP Apply 1 application topically at bedtime. (Patient not taking: Reported on 07/30/2021) 4 mL 2   No current facility-administered medications on file prior to visit.    BP 110/74    Pulse 97    Temp 97.7 F (36.5 C) (Temporal)    Ht 5\' 9"  (1.753 m)    Wt 225 lb (102.1 kg)    SpO2 97%    BMI 33.23 kg/m  Objective:   Physical Exam Cardiovascular:     Rate and Rhythm: Normal rate and regular rhythm.  Pulmonary:     Effort: Pulmonary effort is normal.     Breath sounds: Normal breath sounds. No wheezing or rales.  Abdominal:     General: Bowel sounds are normal.     Palpations: Abdomen is soft.     Tenderness: There is no abdominal tenderness.  Musculoskeletal:     Cervical back: Neck supple.  Skin:    General: Skin is warm and dry.  Neurological:     Mental Status: He is alert and oriented to person, place, and time.          Assessment & Plan:      This visit occurred during the SARS-CoV-2 public health emergency.  Safety protocols were in place, including screening questions prior to the visit, additional usage of staff PPE, and extensive cleaning of exam room while observing appropriate contact time as indicated for disinfecting solutions.

## 2021-07-30 NOTE — Assessment & Plan Note (Signed)
Compliant to CPAP nightly, following with pulmonology. Office notes from November 2022 reviewed.

## 2021-07-30 NOTE — Patient Instructions (Signed)
Stop by the lab prior to leaving today. I will notify you of your results once received.   It was a pleasure to see you today!  

## 2021-07-30 NOTE — Assessment & Plan Note (Signed)
Doing well on Cialis 5 mg, continue same. Follows with Urology. PSA UTD per patient.

## 2021-07-30 NOTE — Assessment & Plan Note (Signed)
Doing well on Cialis 5 mg, continue same. Following with Urology.

## 2021-07-30 NOTE — Assessment & Plan Note (Signed)
Discussed the importance of a healthy diet and regular exercise in order for weight loss, and to reduce the risk of further co-morbidity. ? ?Repeat A1C pending. ?

## 2021-07-30 NOTE — Assessment & Plan Note (Signed)
Compliant to atorvastatin 20 mg, continue same.  Repeat lipid panel pending.

## 2021-10-24 ENCOUNTER — Encounter: Payer: Self-pay | Admitting: Family

## 2021-10-24 ENCOUNTER — Other Ambulatory Visit: Payer: Self-pay

## 2021-10-24 ENCOUNTER — Ambulatory Visit (INDEPENDENT_AMBULATORY_CARE_PROVIDER_SITE_OTHER): Payer: Medicare Other | Admitting: Family

## 2021-10-24 VITALS — BP 99/86 | HR 80 | Temp 98.3°F | Ht 69.0 in | Wt 228.0 lb

## 2021-10-24 DIAGNOSIS — H9202 Otalgia, left ear: Secondary | ICD-10-CM | POA: Diagnosis not present

## 2021-10-24 DIAGNOSIS — H9312 Tinnitus, left ear: Secondary | ICD-10-CM | POA: Diagnosis not present

## 2021-10-24 DIAGNOSIS — I499 Cardiac arrhythmia, unspecified: Secondary | ICD-10-CM | POA: Diagnosis not present

## 2021-10-24 NOTE — Assessment & Plan Note (Addendum)
ekg in office today ?Compared with prior 2019, stable no acute changes. ? ?

## 2021-10-24 NOTE — Assessment & Plan Note (Signed)
Referring to ent for evaluation ?Very narrow ear canals unable to fully visualize canal ?Possible impacted cerumen contributing to fullness ?

## 2021-10-24 NOTE — Progress Notes (Signed)
? ?Established Patient Office Visit ? ?Subjective:  ?Patient ID: Cody Pearson, male    DOB: 1951-02-25  Age: 71 y.o. MRN: [id number] ? ?CC:  ?Chief Complaint  ?Patient presents with  ? Ear Pain  ?  Left ear--aching, ringing, worse when laying down--3 months.  ? ? ?HPI ?Cody Pearson is here today with concerns.  ? ?Left ear has been causing him some discomfort including itching and irritation inside the ear, especially when lying down. He does also note some ringing in the ears (feels pulsating sensation in the ear), maybe just left side states hard to tell. Has had ringing for some time, ringing has been about a year or so. Puts finger in ear and wiggles around ear and feels slightly better after that.  ? ?No drainage from the ear.  ? ?No chest pain no heart racing. No palpitations. Denies sob.  ? ? ?Past Medical History:  ?Diagnosis Date  ? Actinic keratosis 03/28/2020  ? Arthritis   ? Cancer [facility])   ? melanoma  ? History of colonic polyps   ? Hyperlipidemia   ? Melanoma ([place]) ~2017  ? scalp, [facility] at [facility]  ? Sleep apnea   ? hasn't started using CPAP yet  ? ? ?Past Surgical History:  ?Procedure Laterality Date  ? COLONOSCOPY    ? KNEE ARTHROSCOPY Right   ? MEDIAL PARTIAL KNEE REPLACEMENT Right   ? PARTIAL KNEE ARTHROPLASTY Right 10/13/2017  ? Procedure: UNICOMPARTMENTAL KNEE;  Surgeon: Corky Mull, MD;  Location: [facility] ORS;  Service: Orthopedics;  Laterality: Right;  ? ? ?Family History  ?Problem Relation Age of Onset  ? Aneurysm Paternal Grandmother   ? Colon cancer Neg Hx   ? Esophageal cancer Neg Hx   ? Rectal cancer Neg Hx   ? Stomach cancer Neg Hx   ? ? ?Social History  ? ?Socioeconomic History  ? Marital status: Married  ?  Spouse name: Not on file  ? Number of children: 3  ? Years of education: Not on file  ? Highest education level: Not on file  ?Occupational History  ? Occupation: Maintenance  ?  Employer: [employer]   ?Tobacco Use  ? Smoking status: Never  ? Smokeless tobacco: Never  ?Vaping  Use  ? Vaping Use: Never used  ?Substance and Sexual Activity  ? Alcohol use: Yes  ?  Comment: wine occ  ? Drug use: No  ? Sexual activity: Not on file  ?Other Topics Concern  ? Not on file  ?Social History Narrative  ? 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  ? Highest level of education 12th grade.  ? Married.  ? Lives locally.  ? ?Social Determinants of Health  ? ?Financial Resource Strain: Not on file  ?Food Insecurity: Not on file  ?Transportation Needs: Not on file  ?Physical Activity: Not on file  ?Stress: Not on file  ?Social Connections: Not on file  ?Intimate Partner Violence: Not on file  ? ? ?Outpatient Medications Prior to Visit  ?Medication Sig Dispense Refill  ? acetaminophen (TYLENOL) 650 MG CR tablet Take 650 mg by mouth every 8 (eight) hours as needed for pain.    ? atorvastatin (LIPITOR) 20 MG tablet Take 1 tablet (20 mg total) by mouth daily. For cholesterol. 90 tablet 3  ? ciclopirox (LOPROX) 0.77 % SUSP Apply 1 application topically at bedtime. 4 mL 2  ? CLINDAMYCIN HCL PO Take by mouth. Prior to dental procedures    ?  ketoconazole (NIZORAL) 2 % cream APPLY TO BOTH FEET, BETWEEN TOES EVERY NIGHT AT BEDTIME 60 g 1  ? Multiple Vitamins-Minerals (CENTRUM SILVER PO) Take 1 tablet by mouth daily.    ? naproxen sodium (ANAPROX) 220 MG tablet Take 440 mg by mouth 2 (two) times daily.    ? tadalafil (CIALIS) 5 MG tablet Take 5 mg by mouth daily.    ? Tavaborole (KERYDIN) 5 % SOLN Apply 1 application topically daily. 4 mL 1  ? Testosterone 20 % CREA by Does not apply route.    ? ?No facility-administered medications prior to visit.  ? ? ?Allergies  ?Allergen Reactions  ? Penicillins   ?  nausea  ? Amoxicillin Nausea Only  ?  Nausea, anorexia, body aches  ? ? ?ROS ?Review of Systems  ?Constitutional:  Negative for chills and fever.  ?HENT:  Positive for ear pain (aching, with tinnitus, feels fullness) and tinnitus (left > right). Negative for congestion, sinus pain, sore throat and trouble  swallowing.   ?Respiratory:  Negative for cough, shortness of breath and wheezing.   ?Cardiovascular:  Negative for chest pain, palpitations and leg swelling.  ? ?  ?Objective:  ?  ?Physical Exam ?Constitutional:   ?   General: He is awake. He is not in acute distress. ?   Appearance: Normal appearance. He is normal weight. He is not ill-appearing.  ?HENT:  ?   Right Ear: External ear normal. No decreased hearing noted. No drainage or swelling.  ?   Left Ear: External ear normal. Decreased hearing noted. Tenderness (middle of ear canal) present. No drainage or swelling.  ?   Ears:  ?   Comments: Unable to visualize TM due to very narrow ear canal bil  ?Suspected possible impaction contributing to tinnitus ? ?   Nose: Nose normal.  ?   Right Turbinates: Not enlarged or swollen.  ?   Left Turbinates: Not enlarged or swollen.  ?   Right Sinus: No maxillary sinus tenderness or frontal sinus tenderness.  ?   Left Sinus: No maxillary sinus tenderness or frontal sinus tenderness.  ?   Mouth/Throat:  ?   Mouth: Mucous membranes are moist.  ?   Pharynx: No pharyngeal swelling, oropharyngeal exudate or posterior oropharyngeal erythema.  ?Eyes:  ?   Extraocular Movements: Extraocular movements intact.  ?   Pupils: Pupils are equal, round, and reactive to light.  ?Cardiovascular:  ?   Rate and Rhythm: Normal rate. Rhythm irregular.  ?Pulmonary:  ?   Effort: Pulmonary effort is normal.  ?   Breath sounds: Normal breath sounds. No wheezing.  ?Neurological:  ?   Mental Status: He is alert.  ? ? ?BP 99/86   Pulse 80   Temp 98.3 ?F (36.8 ?C)   Ht '5\' 9"'$  (1.753 m)   Wt 228 lb (103.4 kg)   SpO2 96%   BMI 33.67 kg/m?  ?Wt Readings from Last 3 Encounters:  ?10/24/21 228 lb (103.4 kg)  ?07/30/21 225 lb (102.1 kg)  ?06/26/21 232 lb 3.2 oz (105.3 kg)  ? ? ? ?Health Maintenance Due  ?Topic Date Due  ? Zoster Vaccines- Shingrix (1 of 2) Never done  ? COVID-19 Vaccine (4 - Booster for Moderna series) 08/17/2020  ? TETANUS/TDAP   12/11/2020  ? ? ?There are no preventive care reminders to display for this patient. ? ?Lab Results  ?Component Value Date  ? TSH 3.42 04/04/2019  ? ?Lab Results  ?Component Value Date  ? WBC  4.2 07/30/2021  ? HGB 16.0 07/30/2021  ? HCT 48.0 07/30/2021  ? MCV 94.2 07/30/2021  ? PLT 172.0 07/30/2021  ? ?Lab Results  ?Component Value Date  ? NA 139 07/30/2021  ? K 4.7 07/30/2021  ? CO2 30 07/30/2021  ? GLUCOSE 104 (H) 07/30/2021  ? BUN 11 07/30/2021  ? CREATININE 1.26 07/30/2021  ? BILITOT 0.6 07/30/2021  ? ALKPHOS 50 07/30/2021  ? AST 22 07/30/2021  ? ALT 25 07/30/2021  ? PROT 6.8 07/30/2021  ? ALBUMIN 4.3 07/30/2021  ? CALCIUM 9.6 07/30/2021  ? ANIONGAP 5 10/14/2017  ? GFR 58.39 (L) 07/30/2021  ? ?Lab Results  ?Component Value Date  ? HGBA1C 5.7 07/30/2021  ? ? ?  ?Assessment & Plan:  ? ?Problem List Items Addressed This Visit   ? ?  ? Other  ? Tinnitus of left ear  ?  Referring to ent for evaluation ?Very narrow ear canals unable to fully visualize canal ?Possible impacted cerumen contributing to fullness ?  ?  ? Irregular heart rate - Primary  ?  ekg in office today ?Compared with prior 2019, stable no acute changes. ? ?  ?  ? Relevant Orders  ? EKG 12-Lead (Completed)  ? ? ?No orders of the defined types were placed in this encounter. ? ? ?Follow-up: Return if symptoms worsen or fail to improve.  ? ? ?Eugenia Pancoast, FNP ?

## 2021-10-24 NOTE — Patient Instructions (Signed)
A referral was placed today for ENT.  ?Please let us know if you have not heard back within 1 week about your referral. ? ?It was a pleasure seeing you today! Please do not hesitate to reach out with any questions and or concerns. ? ?Regards,  ? ?Lamis Behrmann ?FNP-C ? ? ?

## 2021-10-30 ENCOUNTER — Encounter: Payer: Self-pay | Admitting: *Deleted

## 2021-11-13 DIAGNOSIS — E291 Testicular hypofunction: Secondary | ICD-10-CM | POA: Diagnosis not present

## 2021-11-13 DIAGNOSIS — Z79899 Other long term (current) drug therapy: Secondary | ICD-10-CM | POA: Diagnosis not present

## 2021-11-13 DIAGNOSIS — N401 Enlarged prostate with lower urinary tract symptoms: Secondary | ICD-10-CM | POA: Diagnosis not present

## 2021-11-13 DIAGNOSIS — N5201 Erectile dysfunction due to arterial insufficiency: Secondary | ICD-10-CM | POA: Diagnosis not present

## 2021-11-15 ENCOUNTER — Encounter: Payer: Self-pay | Admitting: Family Medicine

## 2021-11-15 ENCOUNTER — Ambulatory Visit (INDEPENDENT_AMBULATORY_CARE_PROVIDER_SITE_OTHER): Payer: Medicare Other | Admitting: Family Medicine

## 2021-11-15 ENCOUNTER — Other Ambulatory Visit: Payer: Self-pay | Admitting: Family Medicine

## 2021-11-15 VITALS — BP 120/64 | HR 66 | Ht 69.0 in | Wt 225.0 lb

## 2021-11-15 DIAGNOSIS — E782 Mixed hyperlipidemia: Secondary | ICD-10-CM

## 2021-11-15 DIAGNOSIS — R7309 Other abnormal glucose: Secondary | ICD-10-CM | POA: Diagnosis not present

## 2021-11-15 DIAGNOSIS — N401 Enlarged prostate with lower urinary tract symptoms: Secondary | ICD-10-CM

## 2021-11-15 DIAGNOSIS — R3912 Poor urinary stream: Secondary | ICD-10-CM | POA: Diagnosis not present

## 2021-11-15 DIAGNOSIS — G4733 Obstructive sleep apnea (adult) (pediatric): Secondary | ICD-10-CM

## 2021-11-15 DIAGNOSIS — Z9989 Dependence on other enabling machines and devices: Secondary | ICD-10-CM | POA: Diagnosis not present

## 2021-11-15 DIAGNOSIS — Z7689 Persons encountering health services in other specified circumstances: Secondary | ICD-10-CM | POA: Diagnosis not present

## 2021-11-15 DIAGNOSIS — R7989 Other specified abnormal findings of blood chemistry: Secondary | ICD-10-CM

## 2021-11-15 NOTE — Patient Instructions (Addendum)
Thank you for coming to the office today. ? ?Keep up the good work! ? ? ?DUE for FASTING BLOOD WORK (no food or drink after midnight before the lab appointment, only water or coffee without cream/sugar on the morning of) ? ?SCHEDULE "Lab Only" visit in the morning at the clinic for lab draw in 8 MONTHS  ? ?- Make sure Lab Only appointment is at about 1 week before your next appointment, so that results will be available ? ?For Lab Results, once available within 2-3 days of blood draw, you can can log in to MyChart online to view your results and a brief explanation. Also, we can discuss results at next follow-up visit. ? ? ? ?Please schedule a Follow-up Appointment to: Return in about 8 months (around 07/17/2022) for 8 month fasting lab only then 1 week later [facility]. ? ?If you have any other questions or concerns, please feel free to call the office or send a message through [place]. You may also schedule an earlier appointment if necessary. ? ?Additionally, you may be receiving a survey about your experience at our office within a few days to 1 week by e-mail or mail. We value your feedback. ? ?Nobie Putnam, DO ?[place] ?

## 2021-11-15 NOTE — Progress Notes (Signed)
? ?Subjective:  ? ? Patient ID: Cody Pearson, male    DOB: [birth year], 71 y.o.   MRN: [id number] ? ?Cody Pearson is a 71 y.o. male presenting on 11/15/2021 for Establish Care ? ?Here to establish care, previous PCP Alma Friendly ? ?HPI ? ?OSA on CPAP ?- Patient reports prior history of dx OSA and on CPAP, prior to treatment initial symptoms were snoring, daytime sleepiness and fatigue, has had several sleep studies in the past. Most recent followed by [facility] - with sleep management ? ?- Today reports that sleep apnea is well controlled. He uses the CPAP machine every night. Tolerates the machine well, and thinks that sleeps better with it and feels good. No new concerns or symptoms. ? ?BPH ?ED ?Hypogonadism ?Followed by Dr Maryan Puls [facility] ?Continues to take Testosterone cream topical ?On Tadalafil '5mg'$  daily for both BPH and for ED ? ?S/p Partial Knee Replacement R Knee ?Takes antibiotic prophylaxis Clindamycin (prior had allergic reaction and nausea vomiting on Amoxicillin) prior to dentist ? ?Elevated A1c ?Lab result 5.7 to 5.8 recently. ? ?HYPERLIPIDEMIA: ?- Reports no concerns. Last lipid panel 07/2021, controlled on statin ?- Currently taking Atorvastatin '20mg'$ , tolerating well without side effects or myalgias ? ? ?12/11/21 ENT visit for ear tingling and some throbbing. ? ? ? ?  11/15/2021  ? 10:15 AM 10/26/2020  ? 10:00 AM 03/26/2018  ?  7:23 AM  ?Depression screen PHQ 2/9  ?Decreased Interest 0 0 0  ?Down, Depressed, Hopeless 0 0 0  ?PHQ - 2 Score 0 0 0  ?Altered sleeping 0 0   ?Tired, decreased energy 0 0   ?Change in appetite 0 0   ?Feeling bad or failure about yourself  0 0   ?Trouble concentrating 0 0   ?Moving slowly or fidgety/restless 0 0   ?Suicidal thoughts 0 0   ?PHQ-9 Score 0 0   ?Difficult doing work/chores Not difficult at all Not difficult at all   ? ? ?Past Medical History:  ?Diagnosis Date  ? Actinic keratosis 03/28/2020  ? Arthritis   ? [facility]   ? melanoma  ?  History of colonic polyps   ? Hyperlipidemia   ? Melanoma ([place]) ~2017  ? scalp, txted at [facility]  ? Sleep apnea   ? hasn't started using CPAP yet  ? ?Past Surgical History:  ?Procedure Laterality Date  ? COLONOSCOPY    ? KNEE ARTHROSCOPY Right   ? MEDIAL PARTIAL KNEE REPLACEMENT Right   ? PARTIAL KNEE ARTHROPLASTY Right 10/13/2017  ? Procedure: UNICOMPARTMENTAL KNEE;  Surgeon: Corky Mull, MD;  Location: [facility] ORS;  Service: Orthopedics;  Laterality: Right;  ? ?Social History  ? ?Socioeconomic History  ? Marital status: Married  ?  Spouse name: Not on file  ? Number of children: 3  ? Years of education: Not on file  ? Highest education level: Not on file  ?Occupational History  ? Occupation: Maintenance  ?  Employer: [employer]   ?Tobacco Use  ? Smoking status: Never  ? Smokeless tobacco: Never  ?Vaping Use  ? Vaping Use: Never used  ?Substance and Sexual Activity  ? Alcohol use: Yes  ?  Comment: wine occ  ? Drug use: No  ? Sexual activity: Not on file  ?Other Topics Concern  ? Not on file  ?Social History Narrative  ? 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  ? Highest level of education 12th grade.  ? Married.  ?  Lives locally.  ? ?Social Determinants of Health  ? ?Financial Resource Strain: Not on file  ?Food Insecurity: Not on file  ?Transportation Needs: Not on file  ?Physical Activity: Not on file  ?Stress: Not on file  ?Social Connections: Not on file  ?Intimate Partner Violence: Not on file  ? ?Family History  ?Problem Relation Age of Onset  ? Aneurysm Paternal Grandmother   ? Colon cancer Neg Hx   ? Esophageal cancer Neg Hx   ? Rectal cancer Neg Hx   ? Stomach cancer Neg Hx   ? ?Current Outpatient Medications on File Prior to Visit  ?Medication Sig  ? acetaminophen (TYLENOL) 650 MG CR tablet Take 650 mg by mouth every 8 (eight) hours as needed for pain.  ? atorvastatin (LIPITOR) 20 MG tablet Take 1 tablet (20 mg total) by mouth daily. For cholesterol.  ? ciclopirox (LOPROX) 0.77 % SUSP Apply  1 application topically at bedtime.  ? CLINDAMYCIN HCL PO Take by mouth. Prior to dental procedures  ? ketoconazole (NIZORAL) 2 % cream APPLY TO BOTH FEET, BETWEEN TOES EVERY NIGHT AT BEDTIME  ? Multiple Vitamins-Minerals (CENTRUM SILVER PO) Take 1 tablet by mouth daily.  ? naproxen sodium (ANAPROX) 220 MG tablet Take 440 mg by mouth 2 (two) times daily.  ? tadalafil (CIALIS) 5 MG tablet Take 5 mg by mouth daily.  ? Tavaborole (KERYDIN) 5 % SOLN Apply 1 application topically daily.  ? Testosterone 20 % CREA by Does not apply route.  ? ?No current facility-administered medications on file prior to visit.  ? ? ?Review of Systems ?Per HPI unless specifically indicated above ? ? ?   ?Objective:  ?  ?BP 120/64   Pulse 66   Ht '5\' 9"'$  (1.753 m)   Wt 225 lb (102.1 kg)   SpO2 95%   BMI 33.23 kg/m?   ?Wt Readings from Last 3 Encounters:  ?11/15/21 225 lb (102.1 kg)  ?10/24/21 228 lb (103.4 kg)  ?07/30/21 225 lb (102.1 kg)  ?  ?Physical Exam ?Vitals and nursing note reviewed.  ?Constitutional:   ?   General: He is not in acute distress. ?   Appearance: Normal appearance. He is well-developed. He is not diaphoretic.  ?   Comments: Well-appearing, comfortable, cooperative  ?HENT:  ?   Head: Normocephalic and atraumatic.  ?Eyes:  ?   General:     ?   Right eye: No discharge.     ?   Left eye: No discharge.  ?   Conjunctiva/sclera: Conjunctivae normal.  ?Cardiovascular:  ?   Rate and Rhythm: Normal rate.  ?Pulmonary:  ?   Effort: Pulmonary effort is normal.  ?Skin: ?   General: Skin is warm and dry.  ?   Findings: No erythema or rash.  ?Neurological:  ?   Mental Status: He is alert and oriented to person, place, and time.  ?Psychiatric:     ?   Mood and Affect: Mood normal.     ?   Behavior: Behavior normal.     ?   Thought Content: Thought content normal.  ?   Comments: Well groomed, good eye contact, normal speech and thoughts  ? ?Results for orders placed or performed in visit on 07/30/21  ?Lipid panel  ?Result Value Ref  Range  ? Cholesterol 139 0 - 200 mg/dL  ? Triglycerides 85.0 0.0 - 149.0 mg/dL  ? HDL 38.80 (L) >39.00 mg/dL  ? VLDL 17.0 0.0 - 40.0 mg/dL  ? LDL  Cholesterol 83 0 - 99 mg/dL  ? Total CHOL/HDL Ratio 4   ? NonHDL 100.15   ?Hemoglobin A1c  ?Result Value Ref Range  ? Hgb A1c MFr Bld 5.7 4.6 - 6.5 %  ?Comprehensive metabolic panel  ?Result Value Ref Range  ? Sodium 139 135 - 145 mEq/L  ? Potassium 4.7 3.5 - 5.1 mEq/L  ? Chloride 104 96 - 112 mEq/L  ? CO2 30 19 - 32 mEq/L  ? Glucose, Bld 104 (H) 70 - 99 mg/dL  ? BUN 11 6 - 23 mg/dL  ? Creatinine, Ser 1.26 0.40 - 1.50 mg/dL  ? Total Bilirubin 0.6 0.2 - 1.2 mg/dL  ? Alkaline Phosphatase 50 39 - 117 U/L  ? AST 22 0 - 37 U/L  ? ALT 25 0 - 53 U/L  ? Total Protein 6.8 6.0 - 8.3 g/dL  ? Albumin 4.3 3.5 - 5.2 g/dL  ? GFR 58.39 (L) >60.00 mL/min  ? Calcium 9.6 8.4 - 10.5 mg/dL  ?CBC  ?Result Value Ref Range  ? WBC 4.2 4.0 - 10.5 K/uL  ? RBC 5.10 4.22 - 5.81 Mil/uL  ? Platelets 172.0 150.0 - 400.0 K/uL  ? Hemoglobin 16.0 13.0 - 17.0 g/dL  ? HCT 48.0 39.0 - 52.0 %  ? MCV 94.2 78.0 - 100.0 fl  ? MCHC 33.4 30.0 - 36.0 g/dL  ? RDW 13.4 11.5 - 15.5 %  ? ?   ?Assessment & Plan:  ? ?Problem List Items Addressed This Visit   ? ? OSA on CPAP  ?  Followed by [facility] ?Well controlled, chronic OSA on CPAP ?- Good adherence to CPAP nightly ?- Continue current CPAP therapy, patient seems to be benefiting from therapy ?  ?  ? Hyperlipidemia - Primary  ? Elevated hemoglobin A1c  ? Benign prostatic hyperplasia with weak urinary stream  ? ?Other Visit Diagnoses   ? ? Encounter to establish care with new doctor      ? ?  ?  ?Establish care ?Reviewed prior PCP labs and specialist evaluations ?Updated chart and medical history ? ?BPH ?ED ?Followed by Urology Dr Yves Dill ?Continue current therapy. ? ?HLD ?Continue Atorvastatin '20mg'$  daily currently. No change. ? ?No orders of the defined types were placed in this encounter. ? ? ? ? ?Follow up plan: ?Return in about 8 months (around 07/17/2022) for 8  month fasting lab only then 1 week later [facility] ? ?Future labs 07/11/22 ? ?Nobie Putnam, DO ?[facility] ?[facility] ?11/15/2021, 10:39 AM ?

## 2021-11-15 NOTE — Assessment & Plan Note (Signed)
Followed by  [facility] Well controlled, chronic OSA on CPAP - Good adherence to CPAP nightly - Continue current CPAP therapy, patient seems to be benefiting from therapy 

## 2021-12-11 ENCOUNTER — Other Ambulatory Visit: Payer: Self-pay | Admitting: Otolaryngology

## 2021-12-11 DIAGNOSIS — H93A2 Pulsatile tinnitus, left ear: Secondary | ICD-10-CM | POA: Diagnosis not present

## 2021-12-11 DIAGNOSIS — H6983 Other specified disorders of Eustachian tube, bilateral: Secondary | ICD-10-CM | POA: Diagnosis not present

## 2021-12-11 DIAGNOSIS — H903 Sensorineural hearing loss, bilateral: Secondary | ICD-10-CM | POA: Diagnosis not present

## 2021-12-12 ENCOUNTER — Other Ambulatory Visit: Payer: Self-pay | Admitting: Otolaryngology

## 2021-12-12 DIAGNOSIS — H93A2 Pulsatile tinnitus, left ear: Secondary | ICD-10-CM

## 2021-12-26 ENCOUNTER — Ambulatory Visit
Admission: RE | Admit: 2021-12-26 | Discharge: 2021-12-26 | Disposition: A | Discharge status: Home / Self Care | Payer: Medicare Other | Source: Ambulatory Visit | Attending: Otolaryngology | Admitting: Otolaryngology

## 2021-12-26 DIAGNOSIS — I639 Cerebral infarction, unspecified: Secondary | ICD-10-CM | POA: Diagnosis not present

## 2021-12-26 DIAGNOSIS — H93A2 Pulsatile tinnitus, left ear: Secondary | ICD-10-CM | POA: Diagnosis not present

## 2021-12-26 MED ORDER — GADOBENATE DIMEGLUMINE 529 MG/ML IV SOLN
20.0000 mL | Freq: Once | INTRAVENOUS | Status: AC | PRN
  Administered 2021-12-26: 20 mL via INTRAVENOUS

## 2022-01-13 ENCOUNTER — Ambulatory Visit: Payer: Commercial Managed Care - PPO | Admitting: Dermatology

## 2022-01-22 ENCOUNTER — Ambulatory Visit (INDEPENDENT_AMBULATORY_CARE_PROVIDER_SITE_OTHER): Payer: Medicare Other | Admitting: Dermatology

## 2022-01-22 ENCOUNTER — Encounter: Payer: Self-pay | Admitting: Dermatology

## 2022-01-22 DIAGNOSIS — D229 Melanocytic nevi, unspecified: Secondary | ICD-10-CM

## 2022-01-22 DIAGNOSIS — Z1283 Encounter for screening for malignant neoplasm of skin: Secondary | ICD-10-CM | POA: Diagnosis not present

## 2022-01-22 DIAGNOSIS — L821 Other seborrheic keratosis: Secondary | ICD-10-CM

## 2022-01-22 DIAGNOSIS — L820 Inflamed seborrheic keratosis: Secondary | ICD-10-CM

## 2022-01-22 DIAGNOSIS — L918 Other hypertrophic disorders of the skin: Secondary | ICD-10-CM | POA: Diagnosis not present

## 2022-01-22 DIAGNOSIS — L814 Other melanin hyperpigmentation: Secondary | ICD-10-CM

## 2022-01-22 DIAGNOSIS — Z85820 Personal history of malignant melanoma of skin: Secondary | ICD-10-CM | POA: Diagnosis not present

## 2022-01-22 DIAGNOSIS — D1800 Hemangioma unspecified site: Secondary | ICD-10-CM | POA: Diagnosis not present

## 2022-01-22 DIAGNOSIS — L578 Other skin changes due to chronic exposure to nonionizing radiation: Secondary | ICD-10-CM | POA: Diagnosis not present

## 2022-01-22 NOTE — Patient Instructions (Addendum)
Cryotherapy Aftercare  Wash gently with soap and water everyday.   Apply Vaseline and Band-Aid daily until healed.     Due to recent changes in healthcare laws, you may see results of your pathology and/or laboratory studies on MyChart before the doctors have had a chance to review them. We understand that in some cases there may be results that are confusing or concerning to you. Please understand that not all results are received at the same time and often the doctors may need to interpret multiple results in order to provide you with the best plan of care or course of treatment. Therefore, we ask that you please give us 2 business days to thoroughly review all your results before contacting the office for clarification. Should we see a critical lab result, you will be contacted sooner.   If You Need Anything After Your Visit  If you have any questions or concerns for your doctor, please call our main line at [phone] and press option 4 to reach your doctor's medical assistant. If no one answers, please leave a voicemail as directed and we will return your call as soon as possible. Messages left after 4 pm will be answered the following business day.   You may also send us a message via MyChart. We typically respond to MyChart messages within 1-2 business days.  For prescription refills, please ask your pharmacy to contact our office. Our fax number is [phone].  If you have an urgent issue when the clinic is closed that cannot wait until the next business day, you can page your doctor at the number below.    Please note that while we do our best to be available for urgent issues outside of office hours, we are not available 24/7.   If you have an urgent issue and are unable to reach us, you may choose to seek medical care at your doctor's office, retail clinic, urgent care center, or emergency room.  If you have a medical emergency, please immediately call 911 or go to the  emergency department.  Pager Numbers  - Dr. Kowalski: [phone]  - Dr. Moye: [phone]  - Dr. Stewart: [phone]  In the event of inclement weather, please call our main line at [phone] for an update on the status of any delays or closures.  Dermatology Medication Tips: Please keep the boxes that topical medications come in in order to help keep track of the instructions about where and how to use these. Pharmacies typically print the medication instructions only on the boxes and not directly on the medication tubes.   If your medication is too expensive, please contact our office at [phone] option 4 or send us a message through MyChart.   We are unable to tell what your co-pay for medications will be in advance as this is different depending on your insurance coverage. However, we may be able to find a substitute medication at lower cost or fill out paperwork to get insurance to cover a needed medication.   If a prior authorization is required to get your medication covered by your insurance company, please allow us 1-2 business days to complete this process.  Drug prices often vary depending on where the prescription is filled and some pharmacies may offer cheaper prices.  The website www.goodrx.com contains coupons for medications through different pharmacies. The prices here do not account for what the cost may be with help from insurance (it may be cheaper with your insurance), but the website can   give you the price if you did not use any insurance.  - You can print the associated coupon and take it with your prescription to the pharmacy.  - You may also stop by our office during regular business hours and pick up a GoodRx coupon card.  - If you need your prescription sent electronically to a different pharmacy, notify our office through [facility] MyChart or by phone at [phone] option 4.     Si Usted Necesita Algo Despus de Su Visita  Tambin puede  enviarnos un mensaje a travs de MyChart. Por lo general respondemos a los mensajes de MyChart en el transcurso de 1 a 2 das hbiles.  Para renovar recetas, por favor pida a su farmacia que se ponga en contacto con nuestra oficina. Nuestro nmero de fax es el [phone].  Si tiene un asunto urgente cuando la clnica est cerrada y que no puede esperar hasta el siguiente da hbil, puede llamar/localizar a su doctor(a) al nmero que aparece a continuacin.   Por favor, tenga en cuenta que aunque hacemos todo lo posible para estar disponibles para asuntos urgentes fuera del horario de oficina, no estamos disponibles las 24 horas del da, los 7 das de la semana.   Si tiene un problema urgente y no puede comunicarse con nosotros, puede optar por buscar atencin mdica  en el consultorio de su doctor(a), en una clnica privada, en un centro de atencin urgente o en una sala de emergencias.  Si tiene una emergencia mdica, por favor llame inmediatamente al 911 o vaya a la sala de emergencias.  Nmeros de bper  - Dr. Kowalski: [phone]  - Dra. Moye: [phone]  - Dra. Stewart: [phone]  En caso de inclemencias del tiempo, por favor llame a nuestra lnea principal al [phone] para una actualizacin sobre el estado de cualquier retraso o cierre.  Consejos para la medicacin en dermatologa: Por favor, guarde las cajas en las que vienen los medicamentos de uso tpico para ayudarle a seguir las instrucciones sobre dnde y cmo usarlos. Las farmacias generalmente imprimen las instrucciones del medicamento slo en las cajas y no directamente en los tubos del medicamento.   Si su medicamento es muy caro, por favor, pngase en contacto con nuestra oficina llamando al [phone] y presione la opcin 4 o envenos un mensaje a travs de MyChart.   No podemos decirle cul ser su copago por los medicamentos por adelantado ya que esto es diferente dependiendo de la cobertura de su seguro.  Sin embargo, es posible que podamos encontrar un medicamento sustituto a menor costo o llenar un formulario para que el seguro cubra el medicamento que se considera necesario.   Si se requiere una autorizacin previa para que su compaa de seguros cubra su medicamento, por favor permtanos de 1 a 2 das hbiles para completar este proceso.  Los precios de los medicamentos varan con frecuencia dependiendo del lugar de dnde se surte la receta y alguna farmacias pueden ofrecer precios ms baratos.  El sitio web www.goodrx.com tiene cupones para medicamentos de diferentes farmacias. Los precios aqu no tienen en cuenta lo que podra costar con la ayuda del seguro (puede ser ms barato con su seguro), pero el sitio web puede darle el precio si no utiliz ningn seguro.  - Puede imprimir el cupn correspondiente y llevarlo con su receta a la farmacia.  - Tambin puede pasar por nuestra oficina durante el horario de atencin regular y recoger una tarjeta de cupones de GoodRx.  -   Si necesita que su receta se enve electrnicamente a una farmacia diferente, informe a nuestra oficina a travs de MyChart de [place] o por telfono llamando al [phone] y presione la opcin 4.  

## 2022-01-22 NOTE — Progress Notes (Signed)
Follow-Up Visit   Subjective  Cody Pearson is a 71 y.o. male who presents for the following: Total body skin exam (Hx of Melanoma scalp ~2017, hx of AKs). The patient presents for Total-Body Skin Exam (TBSE) for skin cancer screening and mole check.  The patient has spots, moles and lesions to be evaluated, some may be new or changing and the patient has concerns that these could be cancer.   The following portions of the chart were reviewed this encounter and updated as appropriate:   Tobacco  Allergies  Meds  Problems  Med Hx  Surg Hx  Fam Hx     Review of Systems:  No other skin or systemic complaints except as noted in HPI or Assessment and Plan.  Objective  Well appearing patient in no apparent distress; mood and affect are within normal limits.  A full examination was performed including scalp, head, eyes, ears, nose, lips, neck, chest, axillae, abdomen, back, buttocks, bilateral upper extremities, bilateral lower extremities, hands, feet, fingers, toes, fingernails, and toenails. All findings within normal limits unless otherwise noted below.  Scalp Well healed scar with no evidence of recurrence, no lymphadenopathy.   L scalp x 1, R temple/scalp x 19 (20) Stuck on waxy paps with erythema   Assessment & Plan   Lentigines - Scattered tan macules - Due to sun exposure - Benign-appearing, observe - Recommend daily broad spectrum sunscreen SPF 30+ to sun-exposed areas, reapply every 2 hours as needed. - Call for any changes - back  Seborrheic Keratoses - Stuck-on, waxy, tan-brown papules and/or plaques  - Benign-appearing - Discussed benign etiology and prognosis. - Observe - Call for any changes - back  Melanocytic Nevi - Tan-brown and/or pink-flesh-colored symmetric macules and papules - Benign appearing on exam today - Observation - Call clinic for new or changing moles - Recommend daily use of broad spectrum spf 30+ sunscreen to sun-exposed areas.   - trunk  Hemangiomas - Red papules - Discussed benign nature - Observe - Call for any changes - trunk Actinic Damage - Chronic condition, secondary to cumulative UV/sun exposure - diffuse scaly erythematous macules with underlying dyspigmentation - Recommend daily broad spectrum sunscreen SPF 30+ to sun-exposed areas, reapply every 2 hours as needed.  - Staying in the shade or wearing long sleeves, sun glasses (UVA+UVB protection) and wide brim hats (4-inch brim around the entire circumference of the hat) are also recommended for sun protection.  - Call for new or changing lesions.  Skin cancer screening performed today.  Acrochordons (Skin Tags) - Fleshy, skin-colored pedunculated papules - Benign appearing.  - Observe. - If desired, they can be removed with an in office procedure that is not covered by insurance. - Please call the clinic if you notice any new or changing lesions.  - axilla  History of melanoma Scalp  Treated ~ 2017 Mohs at [facility]. Observe for recurrence. Call clinic for new or changing lesions.  Recommend regular skin exams, daily broad-spectrum spf 30+ sunscreen use, and photoprotection.    Inflamed seborrheic keratosis (20) L scalp x 1, R temple/scalp x 19  Symptomatic, irritating, patient would like treated.   Destruction of lesion - L scalp x 1, R temple/scalp x 19 Complexity: simple   Destruction method: cryotherapy   Informed consent: discussed and consent obtained   Timeout:  patient name, date of birth, surgical site, and procedure verified Lesion destroyed using liquid nitrogen: Yes   Region frozen until ice ball extended beyond  lesion: Yes   Outcome: patient tolerated procedure well with no complications   Post-procedure details: wound care instructions given     Return in about 1 year (around 01/23/2023) for TBSE, Hx of Melanoma, Hx of AKs.  I, Cody Pearson, RMA, am acting as scribe for Cody Ser, MD . Documentation: I have  reviewed the above documentation for accuracy and completeness, and I agree with the above.  Cody Ser, MD

## 2022-04-28 ENCOUNTER — Other Ambulatory Visit: Payer: Self-pay

## 2022-04-28 ENCOUNTER — Ambulatory Visit (INDEPENDENT_AMBULATORY_CARE_PROVIDER_SITE_OTHER): Payer: Medicare Other

## 2022-04-28 VITALS — BP 98/58 | HR 75 | Temp 98.3°F | Resp 17 | Ht 69.0 in | Wt 219.6 lb

## 2022-04-28 DIAGNOSIS — Z0000 Encounter for general adult medical examination without abnormal findings: Secondary | ICD-10-CM | POA: Diagnosis not present

## 2022-04-28 DIAGNOSIS — E785 Hyperlipidemia, unspecified: Secondary | ICD-10-CM

## 2022-04-28 MED ORDER — ATORVASTATIN CALCIUM 20 MG PO TABS: 20.0000 mg | ORAL_TABLET | Freq: Every day | ORAL | 0 refills | Status: DC

## 2022-04-28 NOTE — Patient Instructions (Signed)
Health Maintenance, Male Adopting a healthy lifestyle and getting preventive care are important in promoting health and wellness. Ask your health care provider about: The right schedule for you to have regular tests and exams. Things you can do on your own to prevent diseases and keep yourself healthy. What should I know about diet, weight, and exercise? Eat a healthy diet  Eat a diet that includes plenty of vegetables, fruits, low-fat dairy products, and lean protein. Do not eat a lot of foods that are high in solid fats, added sugars, or sodium. Maintain a healthy weight Body mass index (BMI) is a measurement that can be used to identify possible weight problems. It estimates body fat based on height and weight. Your health care provider can help determine your BMI and help you achieve or maintain a healthy weight. Get regular exercise Get regular exercise. This is one of the most important things you can do for your health. Most adults should: Exercise for at least 150 minutes each week. The exercise should increase your heart rate and make you sweat (moderate-intensity exercise). Do strengthening exercises at least twice a week. This is in addition to the moderate-intensity exercise. Spend less time sitting. Even light physical activity can be beneficial. Watch cholesterol and blood lipids Have your blood tested for lipids and cholesterol at 70 years of age, then have this test every 5 years. You may need to have your cholesterol levels checked more often if: Your lipid or cholesterol levels are high. You are older than 70 years of age. You are at high risk for heart disease. What should I know about cancer screening? Many types of cancers can be detected early and may often be prevented. Depending on your health history and family history, you may need to have cancer screening at various ages. This may include screening for: Colorectal cancer. Prostate cancer. Skin cancer. Lung  cancer. What should I know about heart disease, diabetes, and high blood pressure? Blood pressure and heart disease High blood pressure causes heart disease and increases the risk of stroke. This is more likely to develop in people who have high blood pressure readings or are overweight. Talk with your health care provider about your target blood pressure readings. Have your blood pressure checked: Every 3-5 years if you are 18-39 years of age. Every year if you are 40 years old or older. If you are between the ages of 65 and 75 and are a current or former smoker, ask your health care provider if you should have a one-time screening for abdominal aortic aneurysm (AAA). Diabetes Have regular diabetes screenings. This checks your fasting blood sugar level. Have the screening done: Once every three years after age 45 if you are at a normal weight and have a low risk for diabetes. More often and at a younger age if you are overweight or have a high risk for diabetes. What should I know about preventing infection? Hepatitis B If you have a higher risk for hepatitis B, you should be screened for this virus. Talk with your health care provider to find out if you are at risk for hepatitis B infection. Hepatitis C Blood testing is recommended for: Everyone born from 1945 through 1965. Anyone with known risk factors for hepatitis C. Sexually transmitted infections (STIs) You should be screened each year for STIs, including gonorrhea and chlamydia, if: You are sexually active and are younger than 70 years of age. You are older than 70 years of age and your   health care provider tells you that you are at risk for this type of infection. Your sexual activity has changed since you were last screened, and you are at increased risk for chlamydia or gonorrhea. Ask your health care provider if you are at risk. Ask your health care provider about whether you are at high risk for HIV. Your health care provider  may recommend a prescription medicine to help prevent HIV infection. If you choose to take medicine to prevent HIV, you should first get tested for HIV. You should then be tested every 3 months for as long as you are taking the medicine. Follow these instructions at home: Alcohol use Do not drink alcohol if your health care provider tells you not to drink. If you drink alcohol: Limit how much you have to 0-2 drinks a day. Know how much alcohol is in your drink. In the [place] one drink equals one 12 oz bottle of beer (355 mL), one 5 oz glass of wine (148 mL), or one 1 oz glass of hard liquor (44 mL). Lifestyle Do not use any products that contain nicotine or tobacco. These products include cigarettes, chewing tobacco, and vaping devices, such as e-cigarettes. If you need help quitting, ask your health care provider. Do not use street drugs. Do not share needles. Ask your health care provider for help if you need support or information about quitting drugs. General instructions Schedule regular health, dental, and eye exams. Stay current with your vaccines. Tell your health care provider if: You often feel depressed. You have ever been abused or do not feel safe at home. Summary Adopting a healthy lifestyle and getting preventive care are important in promoting health and wellness. Follow your health care provider's instructions about healthy diet, exercising, and getting tested or screened for diseases. Follow your health care provider's instructions on monitoring your cholesterol and blood pressure. This information is not intended to replace advice given to you by your health care provider. Make sure you discuss any questions you have with your health care provider. Document Revised: 12/17/2020 Document Reviewed: 12/17/2020 Elsevier Patient Education  2023 Elsevier Inc.  

## 2022-04-28 NOTE — Progress Notes (Signed)
Subjective:   Cody Pearson is a 71 y.o. male who presents for Medicare Annual/Subsequent preventive examination.  Review of Systems    Per HPI unless specifically indicated below.  Cardiac Risk Factors include: advanced age (>56mn, >>51women);male gender, hyperlipidemia, and Borderline diabetes.           Objective:    Today's Vitals   04/28/22 1327  BP: (!) 98/58  Pulse: 75  Resp: 17  Temp: 98.3 F (36.8 C)  TempSrc: Oral  SpO2: 98%  Weight: 219 lb 9.6 oz (99.6 kg)  Height: '5\' 9"'$  (1.753 m)   Body mass index is 32.43 kg/m.     10/14/2017    8:12 PM 10/13/2017    6:10 AM 09/30/2017    7:56 AM  Advanced Directives  Does Patient Have a Medical Advance Directive? No No No  Would patient like information on creating a medical advance directive? No - Patient declined No - Patient declined No - Patient declined    Current Medications (verified) Outpatient Encounter Medications as of 04/28/2022  Medication Sig   acetaminophen (TYLENOL) 650 MG CR tablet Take 650 mg by mouth every 8 (eight) hours as needed for pain.   atorvastatin (LIPITOR) 20 MG tablet Take 1 tablet (20 mg total) by mouth daily. For cholesterol.   ciclopirox (LOPROX) 0.77 % SUSP Apply 1 application topically at bedtime.   CLINDAMYCIN HCL PO Take by mouth. Prior to dental procedures   tadalafil (CIALIS) 5 MG tablet Take 5 mg by mouth daily.   Testosterone 20 % CREA by Does not apply route.   Multiple Vitamins-Minerals (CENTRUM SILVER PO) Take 1 tablet by mouth daily. (Patient not taking: Reported on 04/28/2022)   [DISCONTINUED] ketoconazole (NIZORAL) 2 % cream APPLY TO BOTH FEET, BETWEEN TOES EVERY NIGHT AT BEDTIME (Patient not taking: Reported on 04/28/2022)   [DISCONTINUED] naproxen sodium (ANAPROX) 220 MG tablet Take 440 mg by mouth 2 (two) times daily. (Patient not taking: Reported on 04/28/2022)   [DISCONTINUED] Tavaborole (KERYDIN) 5 % SOLN Apply 1 application topically daily. (Patient not taking:  Reported on 04/28/2022)   No facility-administered encounter medications on file as of 04/28/2022.    Allergies (verified) Penicillins and Amoxicillin   History: Past Medical History:  Diagnosis Date   Actinic keratosis 03/28/2020   Arthritis    Cancer ([facility]    melanoma   History of colonic polyps    Hyperlipidemia    Melanoma ([facility] ~2017   scalp, [facility]    hasn't started using CPAP yet   Past Surgical History:  Procedure Laterality Date   COLONOSCOPY     KNEE ARTHROSCOPY Right    MEDIAL PARTIAL KNEE REPLACEMENT Right    PARTIAL KNEE ARTHROPLASTY Right 10/13/2017   Procedure: UNICOMPARTMENTAL KNEE;  Surgeon: PCorky Mull MD;  Location: [facility] ORS;  Service: Orthopedics;  Laterality: Right;   Family History  Problem Relation Age of Onset   Aneurysm Paternal Grandmother    Colon cancer Neg Hx    Esophageal cancer Neg Hx    Rectal cancer Neg Hx    Stomach cancer Neg Hx    Social History   Socioeconomic History   Marital status: Married    Spouse name: Cody Pearson  Number of children: 3   Years of education: Not on file   Highest education level: Not on file  Occupational History   Occupation: Maintenance    Employer: [employer]     Comment:  Retired 02/2021   Occupation: Retired    Comment: 03/08/2021  Tobacco Use   Smoking status: Never   Smokeless tobacco: Never  Vaping Use   Vaping Use: Never used  Substance and Sexual Activity   Alcohol use: Yes    Comment: wine occ   Drug use: No   Sexual activity: Not on file  Other Topics Concern   Not on file  Social History Narrative   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   Highest level of education 12th grade.   Married.   Lives locally.   Social Determinants of Health   Financial Resource Strain: Low Risk  (04/28/2022)   Overall Financial Resource Strain (CARDIA)    Difficulty of Paying Living Expenses: Not hard at all  Food Insecurity: No Food Insecurity  (04/28/2022)   Hunger Vital Sign    Worried About Running Out of Food in the Last Year: Never true    Ran Out of Food in the Last Year: Never true  Transportation Needs: No Transportation Needs (04/28/2022)   PRAPARE - Hydrologist (Medical): No    Lack of Transportation (Non-Medical): No  Physical Activity: Sufficiently Active (04/28/2022)   Exercise Vital Sign    Days of Exercise per Week: 5 days    Minutes of Exercise per Session: 50 min  Stress: No Stress Concern Present (04/28/2022)   [place]    Feeling of Stress : Not at all  Social Connections: Moderately Isolated (04/28/2022)   Social Connection and Isolation Panel [NHANES]    Frequency of Communication with Friends and Family: More than three times a week    Frequency of Social Gatherings with Friends and Family: More than three times a week    Attends Religious Services: Never    Marine scientist or Organizations: No    Attends Archivist Meetings: Never    Marital Status: Married    Tobacco Counseling Counseling given: No No counseling needed, because the patient is not a smoker.   Clinical Intake:  Pre-visit preparation completed: No  Pain : No/denies pain     Nutritional Status: BMI > 30  Obese Nutritional Risks: None Diabetes: No  How often do you need to have someone help you when you read instructions, pamphlets, or other written materials from your doctor or pharmacy?: 1 - Never  Diabetic?No   Interpreter Needed?: No  Information entered by :: Donnie Mesa, CMA   Activities of Daily Living    04/28/2022    1:30 PM 07/30/2021    9:46 AM  In your present state of health, do you have any difficulty performing the following activities:  Hearing? 0 0  Vision? 1 0  Difficulty concentrating or making decisions? 0 0  Walking or climbing stairs? 0 0  Dressing or bathing? 0 0  Doing errands,  shopping? 0 0    Patient Care Team: Cody Hauser, DO as PCP - General (Family Medicine)  Indicate any recent Medical Services you may have received from other than [facility] providers in the past year (date may be approximate).    No hospitalization in the past 12 months.  Assessment:   This is a routine wellness examination for Cody Pearson.  Hearing/Vision screen Denies any hearing issues. [place], Wear glasses.   Dietary issues and exercise activities discussed: Current Exercise Habits: Structured exercise class, Type of exercise: walking, Time (Minutes):  45, Frequency (Times/Week): 5, Weekly Exercise (Minutes/Week): 225, Intensity: Moderate, Exercise limited by: None identified   Goals Addressed   None   Depression Screen    04/28/2022    1:31 PM 04/28/2022    1:30 PM 11/15/2021   10:15 AM 10/26/2020   10:00 AM 03/26/2018    7:23 AM 03/26/2018    7:16 AM  PHQ 2/9 Scores  PHQ - 2 Score 0 0 0 0 0 0  PHQ- 9 Score 0  0 0      Fall Risk    04/28/2022    1:31 PM 11/15/2021   10:15 AM 07/30/2021    9:46 AM 04/20/2020    7:47 AM 03/26/2018    7:23 AM  Fall Risk   Falls in the past year? 0 0 0 0 No  Number falls in past yr: 0 0 0    Injury with Fall? 0 0 0    Risk for fall due to : History of fall(s) No Fall Risks     Follow up Falls evaluation completed Falls evaluation completed       [place]:  Any stairs in or around the home? No  If so, are there any without handrails? No  Home free of loose throw rugs in walkways, pet beds, electrical cords, etc? No  Adequate lighting in your home to reduce risk of falls? Yes   ASSISTIVE DEVICES UTILIZED TO PREVENT FALLS:  Life alert? No  Use of a cane, walker or w/c? No  Grab bars in the bathroom? No  Shower chair or bench in shower? No  Elevated toilet seat or a handicapped toilet? Yes   TIMED UP AND GO:  Was the test performed? Yes .  Length of time to ambulate  10 feet: 10  sec.   Gait steady and fast without use of assistive device  Cognitive Function:        04/28/2022    1:39 PM  6CIT Screen  What Year? 0 points  What month? 0 points  What time? 0 points  Count back from 20 0 points  Months in reverse 0 points  Repeat phrase 10 points  Total Score 10 points    Immunizations Immunization History  Administered Date(s) Administered   Fluad Quad(high Dose 65+) 05/25/2020   Influenza,inj,Quad PF,6+ Mos 05/12/2017   Moderna Sars-Covid-2 Vaccination 09/19/2019, 10/17/2019, 06/22/2020   Pneumococcal Conjugate-13 04/04/2019   Pneumococcal Polysaccharide-23 04/20/2020   Tdap 12/12/2010    TDAP status: Due, Education has been provided regarding the importance of this vaccine. Advised may receive this vaccine at local pharmacy or Health Dept. Aware to provide a copy of the vaccination record if obtained from local pharmacy or Health Dept. Verbalized acceptance and understanding.  Flu Vaccine status: Declined, Education has been provided regarding the importance of this vaccine but patient still declined. Advised may receive this vaccine at local pharmacy or Health Dept. Aware to provide a copy of the vaccination record if obtained from local pharmacy or Health Dept. Verbalized acceptance and understanding. The pt plans to get his influenza vaccine in the next two weeks when he comes back off of his vacation.  Pneumococcal vaccine status: Up to date  Covid-19 vaccine status: Completed vaccines  Qualifies for Shingles Vaccine? Yes   Zostavax completed No   Shingrix Completed?: No.    Education has been provided regarding the importance of this vaccine. Patient has been advised to call insurance company to determine out of  pocket expense if they have not yet received this vaccine. Advised may also receive vaccine at local pharmacy or Health Dept. Verbalized acceptance and understanding.  Screening Tests Health Maintenance  Topic Date Due    COVID-19 Vaccine (4 - Moderna risk series) 05/14/2022 (Originally 08/17/2020)   INFLUENZA VACCINE  06/11/2022 (Originally 03/11/2022)   Zoster Vaccines- Shingrix (1 of 2) 07/28/2022 (Originally 07/24/1970)   TETANUS/TDAP  11/16/2022 (Originally 12/11/2020)   COLONOSCOPY (Pts 45-52yr Insurance coverage will need to be confirmed)  03/27/2028   Pneumonia Vaccine 70 Years old  Completed   Hepatitis C Screening  Completed   HPV VACCINES  Aged Out    Health Maintenance  There are no preventive care reminders to display for this patient.   Colorectal cancer screening: Type of screening: Colonoscopy. Completed 7. Repeat every 03/27/2021 years  Lung Cancer Screening: (Low Dose CT Chest recommended if Age 70-80years, 30 pack-year currently smoking OR have quit w/in 15years.) does not qualify.   Lung Cancer Screening Referral: none   Additional Screening:  Hepatitis C Screening: does qualify; Completed 03/26/2018   Vision Screening: Recommended annual ophthalmology exams for early detection of glaucoma and other disorders of the eye. Is the patient up to date with their annual eye exam?  Yes  Who is the provider or what is the name of the office in which the patient attends annual [facility] exams? [facility] If pt is not established with a provider, would they like to be referred to a provider to establish care? No .   Dental Screening: Recommended annual dental exams for proper oral hygiene  Community Resource Referral / Chronic Care Management: CRR required this visit?  No   CCM required this visit?  No      Plan:     I have personally reviewed and noted the following in the patient's chart:   Medical and social history Use of alcohol, tobacco or illicit drugs  Current medications and supplements including opioid prescriptions. Patient is not currently taking opioid prescriptions. Functional ability and status Nutritional status Physical activity Advanced directives List of  other physicians Hospitalizations, surgeries, and ER visits in previous 12 months Vitals Screenings to include cognitive, depression, and falls Referrals and appointments  In addition, I have reviewed and discussed with patient certain preventive protocols, quality metrics, and best practice recommendations. A written personalized care plan for preventive services as well as general preventive health recommendations were provided to patient.    Mr. PAlmond, Thank you for taking time to come for your Medicare Wellness Visit. I appreciate your ongoing commitment to your health goals. Please review the following plan we discussed and let me know if I can assist you in the future.   These are the goals we discussed:  Goals   None     This is a list of the screening recommended for you and due dates:  Health Maintenance  Topic Date Due   COVID-19 Vaccine (4 - Moderna risk series) 05/14/2022*   Flu Shot  06/11/2022*   Zoster (Shingles) Vaccine (1 of 2) 07/28/2022*   Tetanus Vaccine  11/16/2022*   Colon Cancer Screening  03/27/2028   Pneumonia Vaccine  Completed   Hepatitis C Screening: USPSTF Recommendation to screen - Ages 18-79 yo.  Completed   HPV Vaccine  Aged Out  *Topic was postponed. The date shown is not the original due date.      KWilson Singer [place]  04/28/2022   Nurse Notes:  30 minute Face-to-Face Visit

## 2022-05-21 DIAGNOSIS — Z79899 Other long term (current) drug therapy: Secondary | ICD-10-CM | POA: Diagnosis not present

## 2022-05-21 DIAGNOSIS — N401 Enlarged prostate with lower urinary tract symptoms: Secondary | ICD-10-CM | POA: Diagnosis not present

## 2022-05-21 DIAGNOSIS — E291 Testicular hypofunction: Secondary | ICD-10-CM | POA: Diagnosis not present

## 2022-05-21 DIAGNOSIS — N5201 Erectile dysfunction due to arterial insufficiency: Secondary | ICD-10-CM | POA: Diagnosis not present

## 2022-07-10 ENCOUNTER — Other Ambulatory Visit: Payer: Self-pay

## 2022-07-10 DIAGNOSIS — E782 Mixed hyperlipidemia: Secondary | ICD-10-CM

## 2022-07-10 DIAGNOSIS — R7989 Other specified abnormal findings of blood chemistry: Secondary | ICD-10-CM

## 2022-07-10 DIAGNOSIS — G4733 Obstructive sleep apnea (adult) (pediatric): Secondary | ICD-10-CM

## 2022-07-10 DIAGNOSIS — R7309 Other abnormal glucose: Secondary | ICD-10-CM

## 2022-07-10 DIAGNOSIS — N401 Enlarged prostate with lower urinary tract symptoms: Secondary | ICD-10-CM

## 2022-07-11 ENCOUNTER — Other Ambulatory Visit: Payer: Medicare Other

## 2022-07-11 DIAGNOSIS — G4733 Obstructive sleep apnea (adult) (pediatric): Secondary | ICD-10-CM | POA: Diagnosis not present

## 2022-07-11 DIAGNOSIS — E782 Mixed hyperlipidemia: Secondary | ICD-10-CM | POA: Diagnosis not present

## 2022-07-11 DIAGNOSIS — R7309 Other abnormal glucose: Secondary | ICD-10-CM | POA: Diagnosis not present

## 2022-07-11 DIAGNOSIS — R3912 Poor urinary stream: Secondary | ICD-10-CM | POA: Diagnosis not present

## 2022-07-11 DIAGNOSIS — R7989 Other specified abnormal findings of blood chemistry: Secondary | ICD-10-CM | POA: Diagnosis not present

## 2022-07-11 DIAGNOSIS — N401 Enlarged prostate with lower urinary tract symptoms: Secondary | ICD-10-CM | POA: Diagnosis not present

## 2022-07-12 LAB — COMPLETE METABOLIC PANEL WITH GFR
AG Ratio: 2.1 (calc) (ref 1.0–2.5)
ALT: 25 U/L (ref 9–46)
AST: 18 U/L (ref 10–35)
Albumin: 4.4 g/dL (ref 3.6–5.1)
Alkaline phosphatase (APISO): 64 U/L (ref 35–144)
BUN: 12 mg/dL (ref 7–25)
CO2: 27 mmol/L (ref 20–32)
Calcium: 9.4 mg/dL (ref 8.6–10.3)
Chloride: 105 mmol/L (ref 98–110)
Creat: 1.26 mg/dL (ref 0.70–1.35)
Globulin: 2.1 g/dL (calc) (ref 1.9–3.7)
Glucose, Bld: 110 mg/dL — ABNORMAL HIGH (ref 65–99)
Potassium: 4.5 mmol/L (ref 3.5–5.3)
Sodium: 141 mmol/L (ref 135–146)
Total Bilirubin: 0.6 mg/dL (ref 0.2–1.2)
Total Protein: 6.5 g/dL (ref 6.1–8.1)
eGFR: 62 mL/min/{1.73_m2} (ref >=60)

## 2022-07-12 LAB — CBC WITH DIFFERENTIAL/PLATELET
Absolute Monocytes: 624 cells/uL (ref 200–950)
Basophils Absolute: 31 cells/uL (ref 0–200)
Basophils Relative: 0.6 %
Eosinophils Absolute: 182 cells/uL (ref 15–500)
Eosinophils Relative: 3.5 %
HCT: 48.7 % (ref 38.5–50.0)
Hemoglobin: 16.3 g/dL (ref 13.2–17.1)
Lymphs Abs: 1295 cells/uL (ref 850–3900)
MCH: 31.4 pg (ref 27.0–33.0)
MCHC: 33.5 g/dL (ref 32.0–36.0)
MCV: 93.8 fL (ref 80.0–100.0)
MPV: 11.2 fL (ref 7.5–12.5)
Monocytes Relative: 12 %
Neutro Abs: 3068 cells/uL (ref 1500–7800)
Neutrophils Relative %: 59 %
Platelets: 204 10*3/uL (ref 140–400)
RBC: 5.19 10*6/uL (ref 4.20–5.80)
RDW: 13.1 % (ref 11.0–15.0)
Total Lymphocyte: 24.9 %
WBC: 5.2 10*3/uL (ref 3.8–10.8)

## 2022-07-12 LAB — PSA: PSA: 0.73 ng/mL (ref <=4.00)

## 2022-07-12 LAB — LIPID PANEL
Cholesterol: 150 mg/dL (ref <200)
HDL: 37 mg/dL — ABNORMAL LOW (ref >=40)
LDL Cholesterol (Calc): 92 mg/dL (calc)
Non-HDL Cholesterol (Calc): 113 mg/dL (calc) (ref <130)
Total CHOL/HDL Ratio: 4.1 (calc) (ref <5.0)
Triglycerides: 110 mg/dL (ref <150)

## 2022-07-12 LAB — HEMOGLOBIN A1C
Hgb A1c MFr Bld: 5.8 % of total Hgb — ABNORMAL HIGH (ref <5.7)
Mean Plasma Glucose: 120 mg/dL
eAG (mmol/L): 6.6 mmol/L

## 2022-07-12 LAB — TSH: TSH: 5.72 mIU/L — ABNORMAL HIGH (ref 0.40–4.50)

## 2022-07-17 ENCOUNTER — Encounter: Payer: Self-pay | Admitting: Family Medicine

## 2022-07-17 ENCOUNTER — Other Ambulatory Visit: Payer: Self-pay | Admitting: Family Medicine

## 2022-07-17 ENCOUNTER — Ambulatory Visit (INDEPENDENT_AMBULATORY_CARE_PROVIDER_SITE_OTHER): Payer: Medicare Other | Admitting: Family Medicine

## 2022-07-17 VITALS — BP 116/76 | HR 66 | Ht 69.0 in | Wt 221.2 lb

## 2022-07-17 DIAGNOSIS — E291 Testicular hypofunction: Secondary | ICD-10-CM

## 2022-07-17 DIAGNOSIS — E782 Mixed hyperlipidemia: Secondary | ICD-10-CM | POA: Diagnosis not present

## 2022-07-17 DIAGNOSIS — R3912 Poor urinary stream: Secondary | ICD-10-CM | POA: Diagnosis not present

## 2022-07-17 DIAGNOSIS — R7989 Other specified abnormal findings of blood chemistry: Secondary | ICD-10-CM

## 2022-07-17 DIAGNOSIS — R7309 Other abnormal glucose: Secondary | ICD-10-CM | POA: Diagnosis not present

## 2022-07-17 DIAGNOSIS — E785 Hyperlipidemia, unspecified: Secondary | ICD-10-CM | POA: Diagnosis not present

## 2022-07-17 DIAGNOSIS — G4733 Obstructive sleep apnea (adult) (pediatric): Secondary | ICD-10-CM

## 2022-07-17 DIAGNOSIS — N401 Enlarged prostate with lower urinary tract symptoms: Secondary | ICD-10-CM

## 2022-07-17 MED ORDER — ATORVASTATIN CALCIUM 20 MG PO TABS: 20.0000 mg | ORAL_TABLET | Freq: Every day | ORAL | 3 refills | Status: DC

## 2022-07-17 NOTE — Progress Notes (Addendum)
Subjective:    Patient ID: Cody Pearson, male    DOB: [birth year], 71 y.o.   MRN: [id number]  Cody Pearson is a 71 y.o. male presenting on 07/17/2022 for Hyperlipidemia   HPI  Here for Annual Physical and Lab  OSA on CPAP - Patient reports prior history of dx OSA and on CPAP, prior to treatment initial symptoms were snoring, daytime sleepiness and fatigue, has had several sleep studies in the past. Most recent followed by [facility] - with sleep management   - Today reports that sleep apnea is well controlled. He uses the CPAP machine every night. Tolerates the machine well, and thinks that sleeps better with it and feels good. No new concerns or symptoms.   BPH ED Hypogonadism Followed by Dr Maryan Puls [facility] Continues to take Testosterone cream topical On Tadalafil 8m daily for both BPH and for ED Dr WYves Dillis retiring he needs new provider.   S/p Partial Knee Replacement R Knee Takes antibiotic prophylaxis Clindamycin (prior had allergic reaction and nausea vomiting on Amoxicillin) prior to dentist   Elevated A1c Lab result 5.7 to 5.8 recently. Last lab 5.8, he has tried to limit sugar.   HYPERLIPIDEMIA: - Reports no concerns. Last lipid panel 07/2022, controlled on statin - Currently taking Atorvastatin 216m tolerating well without side effects or myalgias   Elevated TSH Lab TSH up to 5.72, previously 3 years ago 6.06, and had improved since 3.42    Health Maintenance:  Colonoscopy completed 03/2021, next repeat 7 years.  PSA 0.73 negative.      07/17/2022   10:14 AM 04/28/2022    1:31 PM 04/28/2022    1:30 PM  Depression screen PHQ 2/9  Decreased Interest 0 0 0  Down, Depressed, Hopeless 0 0 0  PHQ - 2 Score 0 0 0  Altered sleeping 0 0   Tired, decreased energy 0 0   Change in appetite 0 0   Feeling bad or failure about yourself  0 0   Trouble concentrating 0 0   Moving slowly or fidgety/restless 0 0   Suicidal thoughts 0 0    PHQ-9 Score 0 0   Difficult doing work/chores Not difficult at all Not difficult at all     Past Medical History:  Diagnosis Date   Actinic keratosis 03/28/2020   Arthritis    [place]   [place]an[place]   History of colonic polyps    Hyperlipidemia    [place]   [facility]    hasn't started using CPAP yet   Past Surgical History:  Procedure Laterality Date   COLONOSCOPY     KNEE ARTHROSCOPY Right    MEDIAL PARTIAL KNEE REPLACEMENT Right    PARTIAL KNEE ARTHROPLASTY Right 10/13/2017   Procedure: UNICOMPARTMENTAL KNEE;  Surgeon: PoCorky MullMD;  Location: [facility] ORS;  Service: Orthopedics;  Laterality: Right;   Social History   Socioeconomic History   Marital status: Married    Spouse name: JaStanley Lyness Number of children: 3   Years of education: Not on file   Highest education level: Not on file  Occupational History   Occupation: Maintenance    Employer: [employer]     Comment: Retired 02/2021   Occupation: Retired    Comment: 03/08/2021  Tobacco Use   Smoking status: Never   Smokeless tobacco: Never  Vaping Use   Vaping Use: Never used  Substance and  Sexual Activity   Alcohol use: Yes    Comment: wine occ   Drug use: No   Sexual activity: Not on file  Other Topics Concern   Not on file  Social History Narrative   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   Highest level of education 12th grade.   Married.   Lives locally.   Social Determinants of Health   Financial Resource Strain: Low Risk  (04/28/2022)   Overall Financial Resource Strain (CARDIA)    Difficulty of Paying Living Expenses: Not hard at all  Food Insecurity: No Food Insecurity (04/28/2022)   Hunger Vital Sign    Worried About Running Out of Food in the Last Year: Never true    Ran Out of Food in the Last Year: Never true  Transportation Needs: No Transportation Needs (04/28/2022)   PRAPARE - Hydrologist  (Medical): No    Lack of Transportation (Non-Medical): No  Physical Activity: Sufficiently Active (04/28/2022)   Exercise Vital Sign    Days of Exercise per Week: 5 days    Minutes of Exercise per Session: 50 min  Stress: No Stress Concern Present (04/28/2022)   [place]    Feeling of Stress : Not at all  Social Connections: Moderately Isolated (04/28/2022)   Social Connection and Isolation Panel [NHANES]    Frequency of Communication with Friends and Family: More than three times a week    Frequency of Social Gatherings with Friends and Family: More than three times a week    Attends Religious Services: Never    Marine scientist or Organizations: No    Attends Archivist Meetings: Never    Marital Status: Married  Human resources officer Violence: Not At Risk (04/28/2022)   Humiliation, Afraid, Rape, and Kick questionnaire    Fear of Current or Ex-Partner: No    Emotionally Abused: No    Physically Abused: No    Sexually Abused: No   Family History  Problem Relation Age of Onset   Aneurysm Paternal Grandmother    Colon cancer Neg Hx    Esophageal cancer Neg Hx    Rectal cancer Neg Hx    Stomach cancer Neg Hx    Current Outpatient Medications on File Prior to Visit  Medication Sig   acetaminophen (TYLENOL) 650 MG CR tablet Take 650 mg by mouth every 8 (eight) hours as needed for pain.   ciclopirox (LOPROX) 0.77 % SUSP Apply 1 application topically at bedtime.   CLINDAMYCIN HCL PO Take by mouth. Prior to dental procedures   ketoconazole (NIZORAL) 2 % cream Apply 1 Application topically daily.   Multiple Vitamins-Minerals (CENTRUM SILVER PO) Take 1 tablet by mouth daily.   tadalafil (CIALIS) 5 MG tablet Take 5 mg by mouth daily.   Testosterone 20 % CREA by Does not apply route.   No current facility-administered medications on file prior to visit.    Review of Systems  Constitutional:  Negative for  activity change, appetite change, chills, diaphoresis, fatigue and fever.  HENT:  Negative for congestion and hearing loss.   Eyes:  Negative for visual disturbance.  Respiratory:  Negative for cough, chest tightness, shortness of breath and wheezing.   Cardiovascular:  Negative for chest pain, palpitations and leg swelling.  Gastrointestinal:  Negative for abdominal pain, constipation, diarrhea, nausea and vomiting.  Genitourinary:  Negative for dysuria, frequency and hematuria.  Musculoskeletal:  Negative for arthralgias  and neck pain.  Skin:  Negative for rash.  Neurological:  Negative for dizziness, weakness, light-headedness, numbness and headaches.  Hematological:  Negative for adenopathy.  Psychiatric/Behavioral:  Negative for behavioral problems, dysphoric mood and sleep disturbance.    Per HPI unless specifically indicated above     Objective:    BP 116/76   Pulse 66   Ht _0  (1.753 m)   Wt 221 lb 3.2 oz (100.3 kg)   SpO2 95%   BMI 32.67 kg/m   Wt Readings from Last 3 Encounters:  07/17/22 221 lb 3.2 oz (100.3 kg)  04/28/22 219 lb 9.6 oz (99.6 kg)  11/15/21 225 lb (102.1 kg)    Physical Exam Vitals and nursing note reviewed.  Constitutional:      General: He is not in acute distress.    Appearance: He is well-developed. He is not diaphoretic.     Comments: Well-appearing, comfortable, cooperative  HENT:     Head: Normocephalic and atraumatic.  Eyes:     General:        Right eye: No discharge.        Left eye: No discharge.     Conjunctiva/sclera: Conjunctivae normal.     Pupils: Pupils are equal, round, and reactive to light.  Neck:     Thyroid: No thyromegaly.  Cardiovascular:     Rate and Rhythm: Normal rate and regular rhythm.     Pulses: Normal pulses.     Heart sounds: Normal heart sounds. No murmur heard. Pulmonary:     Effort: Pulmonary effort is normal. No respiratory distress.     Breath sounds: Normal breath sounds. No wheezing or rales.   Abdominal:     General: Bowel sounds are normal. There is no distension.     Palpations: Abdomen is soft. There is no mass.     Tenderness: There is no abdominal tenderness.  Musculoskeletal:        General: No tenderness. Normal range of motion.     Cervical back: Normal range of motion and neck supple.     Comments: Upper / Lower Extremities: - Normal muscle tone, strength bilateral upper extremities 5/5, lower extremities 5/5  Lymphadenopathy:     Cervical: No cervical adenopathy.  Skin:    General: Skin is warm and dry.     Findings: No erythema or rash.  Neurological:     Mental Status: He is alert and oriented to person, place, and time.     Comments: Distal sensation intact to light touch all extremities  Psychiatric:        Mood and Affect: Mood normal.        Behavior: Behavior normal.        Thought Content: Thought content normal.     Comments: Well groomed, good eye contact, normal speech and thoughts      Results for orders placed or performed in visit on 07/10/22  TSH  Result Value Ref Range   TSH 5.72 (H) 0.40 - 4.50 mIU/L  Lipid panel  Result Value Ref Range   Cholesterol 150 <200 mg/dL   HDL 37 (L) > OR = 40 mg/dL   Triglycerides 110 <150 mg/dL   LDL Cholesterol (Calc) 92 mg/dL (calc)   Total CHOL/HDL Ratio 4.1 <5.0 (calc)   Non-HDL Cholesterol (Calc) 113 <130 mg/dL (calc)  PSA  Result Value Ref Range   PSA 0.73 < OR = 4.00 ng/mL  COMPLETE METABOLIC PANEL WITH GFR  Result Value Ref Range  Glucose, Bld 110 (H) 65 - 99 mg/dL   BUN 12 7 - 25 mg/dL   Creat 1.26 0.70 - 1.35 mg/dL   eGFR 62 > OR = 60 mL/min/1.62m   BUN/Creatinine Ratio SEE NOTE: 6 - 22 (calc)   Sodium 141 135 - 146 mmol/L   Potassium 4.5 3.5 - 5.3 mmol/L   Chloride 105 98 - 110 mmol/L   CO2 27 20 - 32 mmol/L   Calcium 9.4 8.6 - 10.3 mg/dL   Total Protein 6.5 6.1 - 8.1 g/dL   Albumin 4.4 3.6 - 5.1 g/dL   Globulin 2.1 1.9 - 3.7 g/dL (calc)   AG Ratio 2.1 1.0 - 2.5 (calc)   Total  Bilirubin 0.6 0.2 - 1.2 mg/dL   Alkaline phosphatase (APISO) 64 35 - 144 U/L   AST 18 10 - 35 U/L   ALT 25 9 - 46 U/L  Hemoglobin A1c  Result Value Ref Range   Hgb A1c MFr Bld 5.8 (H) <5.7 % of total Hgb   Mean Plasma Glucose 120 mg/dL   eAG (mmol/L) 6.6 mmol/L  CBC with Differential/Platelet  Result Value Ref Range   WBC 5.2 3.8 - 10.8 Thousand/uL   RBC 5.19 4.20 - 5.80 Million/uL   Hemoglobin 16.3 13.2 - 17.1 g/dL   HCT 48.7 38.5 - 50.0 %   MCV 93.8 80.0 - 100.0 fL   MCH 31.4 27.0 - 33.0 pg   MCHC 33.5 32.0 - 36.0 g/dL   RDW 13.1 11.0 - 15.0 %   Platelets 204 140 - 400 Thousand/uL   MPV 11.2 7.5 - 12.5 fL   Neutro Abs 3,068 1,500 - 7,800 cells/uL   Lymphs Abs 1,295 850 - 3,900 cells/uL   Absolute Monocytes 624 200 - 950 cells/uL   Eosinophils Absolute 182 15 - 500 cells/uL   Basophils Absolute 31 0 - 200 cells/uL   Neutrophils Relative % 59 %   Total Lymphocyte 24.9 %   Monocytes Relative 12.0 %   Eosinophils Relative 3.5 %   Basophils Relative 0.6 %      Assessment & Plan:   Problem List Items Addressed This Visit     Abnormal TSH    Repeat TSH + T4 next time 6 months Currently mild, asymptomatic      Benign prostatic hyperplasia with weak urinary stream   Relevant Orders   Ambulatory referral to Urology   Elevated hemoglobin A1c    Mild elevated A1c 5.8 Concern with obesity, HTN, HLD  Plan:  1. Not on any therapy currently  2. Encourage improved lifestyle - low carb, low sugar diet, reduce portion size, continue improving regular exercise  Repeat 6 month      Hyperlipidemia - Primary    Controlled cholesterol on statin  The 10-year ASCVD risk score (Arnett DK, et al., 2019) is: 14.2%   Plan: 1. Continue current meds - Atorvastatin 226m2. Encourage improved lifestyle - low carb/cholesterol, reduce portion size, continue improving regular exercise       Relevant Medications   atorvastatin (LIPITOR) 20 MG tablet   OSA on CPAP    Followed by  [facility] Well controlled, chronic OSA on CPAP - Good adherence to CPAP nightly - Continue current CPAP therapy, patient seems to be benefiting from therapy      Other Visit Diagnoses     Hypogonadism in male       Relevant Orders   Ambulatory referral to Urology       Updated Health  Maintenance information Reviewed recent lab results with patient Encouraged improvement to lifestyle with diet and exercise Goal of weight loss  BPH Low T referral to Urology for establish care for hypogonadism low T and BPH medication management, he was followed by other provider who is retiring.   Orders Placed This Encounter  Procedures   Ambulatory referral to Urology    Referral Priority:   Routine    Referral Type:   Consultation    Referral Reason:   Specialty Services Required    Requested Specialty:   Urology    Number of Visits Requested:   1      Meds ordered this encounter  Medications   atorvastatin (LIPITOR) 20 MG tablet    Sig: Take 1 tablet (20 mg total) by mouth daily. For cholesterol.    Dispense:  90 tablet    Refill:  3    Add refills for 1 year      Follow up plan: Return in about 6 months (around 01/16/2023) for 6 month fasting lab only then 1 week later Follow-up Thyroid, Elevated sugar..  6 months TSH T4 A1c  Nobie Putnam, DO [facility] 07/17/2022, 9:42 AM

## 2022-07-17 NOTE — Assessment & Plan Note (Signed)
Controlled cholesterol on statin  The 10-year ASCVD risk score (Arnett DK, et al., 2019) is: 14.2%   Plan: 1. Continue current meds - Atorvastatin '20mg'$  2. Encourage improved lifestyle - low carb/cholesterol, reduce portion size, continue improving regular exercise

## 2022-07-17 NOTE — Patient Instructions (Addendum)
Thank you for coming to the office today.  Recent Labs    07/30/21 1010 07/11/22 0829  HGBA1C 5.7 5.8*   Goal to limit excess starches carbs sugars.  Cholesterol is well controlled  Mild elevated thyroid TSH signal - which means that your actual thyroid hormone in the blood stream is slightly lower than average. We can double check in 6 months.  [facility] -[address]st floor [place],  [place]  [address] Phone: [phone]   DUE for FASTING BLOOD WORK (no food or drink after midnight before the lab appointment, only water or coffee without cream/sugar on the morning of)  SCHEDULE "Lab Only" visit in the morning at the clinic for lab draw in 6 MONTHS   - Make sure Lab Only appointment is at about 1 week before your next appointment, so that results will be available  For Lab Results, once available within 2-3 days of blood draw, you can can log in to MyChart online to view your results and a brief explanation. Also, we can discuss results at next follow-up visit.   Please schedule a Follow-up Appointment to: Return in about 6 months (around 01/16/2023) for 6 month fasting lab only then 1 week later Follow-up Thyroid, Elevated sugar..  If you have any other questions or concerns, please feel free to call the office or send a message through [place]. You may also schedule an earlier appointment if necessary.  Additionally, you may be receiving a survey about your experience at our office within a few days to 1 week by e-mail or mail. We value your feedback.  Nobie Putnam, DO [place]

## 2022-07-17 NOTE — Assessment & Plan Note (Signed)
Repeat TSH + T4 next time 6 months Currently mild, asymptomatic

## 2022-07-17 NOTE — Assessment & Plan Note (Signed)
Followed by [facility] Well controlled, chronic OSA on CPAP - Good adherence to CPAP nightly - Continue current CPAP therapy, patient seems to be benefiting from therapy

## 2022-07-17 NOTE — Assessment & Plan Note (Signed)
Mild elevated A1c 5.8 Concern with obesity, HTN, HLD  Plan:  1. Not on any therapy currently  2. Encourage improved lifestyle - low carb, low sugar diet, reduce portion size, continue improving regular exercise  Repeat 6 month

## 2022-08-27 ENCOUNTER — Ambulatory Visit (INDEPENDENT_AMBULATORY_CARE_PROVIDER_SITE_OTHER): Payer: Medicare Other | Admitting: Urology

## 2022-08-27 ENCOUNTER — Encounter: Payer: Self-pay | Admitting: Urology

## 2022-08-27 VITALS — BP 116/76 | HR 90 | Ht 69.0 in | Wt 215.0 lb

## 2022-08-27 DIAGNOSIS — N401 Enlarged prostate with lower urinary tract symptoms: Secondary | ICD-10-CM | POA: Diagnosis not present

## 2022-08-27 DIAGNOSIS — E291 Testicular hypofunction: Secondary | ICD-10-CM | POA: Diagnosis not present

## 2022-08-27 DIAGNOSIS — N5201 Erectile dysfunction due to arterial insufficiency: Secondary | ICD-10-CM

## 2022-08-27 DIAGNOSIS — R3912 Poor urinary stream: Secondary | ICD-10-CM | POA: Diagnosis not present

## 2022-08-27 LAB — URINALYSIS, COMPLETE
Bilirubin, UA: NEGATIVE
Glucose, UA: NEGATIVE
Ketones, UA: NEGATIVE
Leukocytes,UA: NEGATIVE
Nitrite, UA: NEGATIVE
Protein,UA: NEGATIVE
RBC, UA: NEGATIVE
Specific Gravity, UA: 1.025 (ref 1.005–1.030)
Urobilinogen, Ur: 0.2 mg/dL (ref 0.2–1.0)
pH, UA: 5.5 (ref 5.0–7.5)

## 2022-08-27 LAB — MICROSCOPIC EXAMINATION: Bacteria, UA: NONE SEEN

## 2022-08-27 LAB — BLADDER SCAN AMB NON-IMAGING: Scan Result: 14

## 2022-08-27 NOTE — Progress Notes (Signed)
08/27/2022 9:28 AM   Cody Pearson [birth year] [id number]  Referring provider: Olin Hauser, DO [address] [place],  [place] [address]  Chief Complaint  Patient presents with   Benign Prostatic Hypertrophy    HPI: Cody Pearson is a 72 y.o. male referred for evaluation of BPH with lower urinary tract symptoms.  Prior patient of Dr. Yves Dill who recently retired and he was followed for hypogonadism, BPH and erectile dysfunction Was on a compounded testosterone cream 20% 1 cc daily and tadalafil 5 mg daily for ED and BPH He last saw Dr. Yves Dill October 2023.  Testosterone level was 332, PSA 1.3 and hematocrit 48.0 Lower urinary tract symptoms include urinary frequency and a weak urinary stream.  IPSS today 15/35 Hypogonadism symptoms include tiredness, fatigue and to ED   PMH: Past Medical History:  Diagnosis Date   Actinic keratosis 03/28/2020   Arthritis    Cancer ([place])    melanoma   History of colonic polyps    Hyperlipidemia    Melanoma ([place]) ~2017   scalp, txted at [place] apnea    hasn't started using CPAP yet    Surgical History: Past Surgical History:  Procedure Laterality Date   COLONOSCOPY     KNEE ARTHROSCOPY Right    MEDIAL PARTIAL KNEE REPLACEMENT Right    PARTIAL KNEE ARTHROPLASTY Right 10/13/2017   Procedure: UNICOMPARTMENTAL KNEE;  Surgeon: Corky Mull, MD;  Location: [facility] ORS;  Service: Orthopedics;  Laterality: Right;    Home Medications:  Allergies as of 08/27/2022       Reactions   Penicillins    nausea   Amoxicillin Nausea Only   Nausea, anorexia, body aches        Medication List        Accurate as of August 27, 2022  9:28 AM. If you have any questions, ask your nurse or doctor.          acetaminophen 650 MG CR tablet Commonly known as: TYLENOL Take 650 mg by mouth every 8 (eight) hours as needed for pain.   atorvastatin 20 MG tablet Commonly known as: LIPITOR Take 1 tablet (20 mg total) by mouth  daily. For cholesterol.   CENTRUM SILVER PO Take 1 tablet by mouth daily.   ciclopirox 0.77 % Susp Commonly known as: LOPROX Apply 1 application topically at bedtime.   CLINDAMYCIN HCL PO Take by mouth. Prior to dental procedures   ketoconazole 2 % cream Commonly known as: NIZORAL Apply 1 Application topically daily.   tadalafil 5 MG tablet Commonly known as: CIALIS Take 5 mg by mouth daily.   Testosterone 20 % Crea by Does not apply route.        Allergies:  Allergies  Allergen Reactions   Penicillins     nausea   Amoxicillin Nausea Only    Nausea, anorexia, body aches    Family History: Family History  Problem Relation Age of Onset   Aneurysm Paternal Grandmother    Colon cancer Neg Hx    Esophageal cancer Neg Hx    Rectal cancer Neg Hx    Stomach cancer Neg Hx     Social History:  reports that he has never smoked. He has never used smokeless tobacco. He reports current alcohol use. He reports that he does not use drugs.   Physical Exam: BP 116/76   Pulse 90   Ht '5\' 9"'$  (1.753 m)   Wt 215 lb (97.5 kg)  BMI 31.75 kg/m   Constitutional:  Alert and oriented, No acute distress. HEENT: [place] AT Respiratory: Normal respiratory effort, no increased work of breathing. Psychiatric: Normal mood and affect.    Assessment & Plan:    1. Benign prostatic hyperplasia with weak urinary stream Stable on tadalafil 5 mg daily PVR 14 mL  2.  Erectile dysfunction Stable on tadalafil 5 mg daily  3.  Hypogonadism Stable on topical testosterone He did not need refill at this time. Dr. Yves Dill was seen him twice yearly.  I have recommended a yearly visit with testosterone, PSA and hematocrit and an interim 37-monthlab visit for testosterone/hematocrit    SAbbie Sons [place] [place]  [phone]

## 2022-11-13 IMAGING — MR MR BRAIN/TEMPORAL BONE/IAC
12 of 13 series · 45 of 48 positions shown · IV contrast (multihance)
Comparison: None Available.

CLINICAL DATA: Pulsatile tinnitus left ear

EXAM:
MRI HEAD WITHOUT AND WITH CONTRAST
TECHNIQUE: Multiplanar, multiecho pulse sequences of the brain and surrounding
structures were obtained without and with intravenous contrast.
CONTRAST:  20mL MULTIHANCE GADOBENATE DIMEGLUMINE 529 MG/ML IV SOLN

[Series 5: T1 · sagittal · 4.0mm · 0.72mm/px · 2 of 29 slices shown (1 of 3)]
[im 1/29]
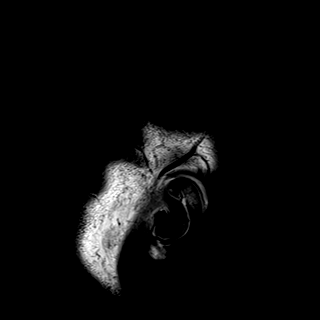
[im 29/29]
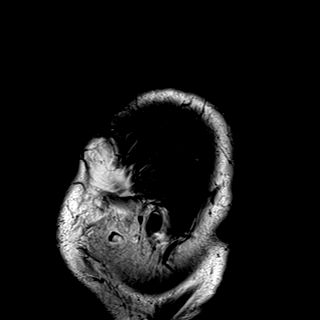

[Series 6: DWI · axial · 3.0mm · 0.94mm/px · z∈[-79,+83]mm · 12 of 184 slices shown]
[im 1/184]
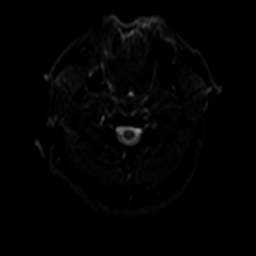
[im 17/184]
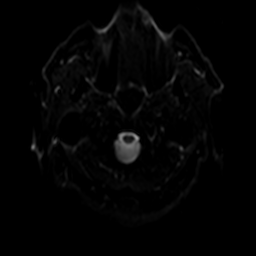
[im 34/184]
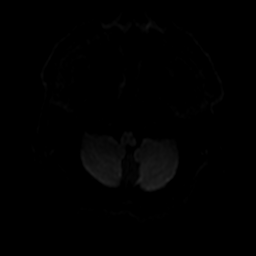
[im 50/184]
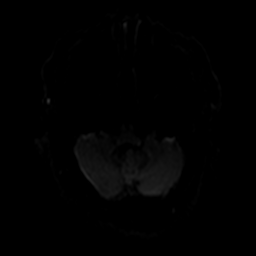
[im 67/184]
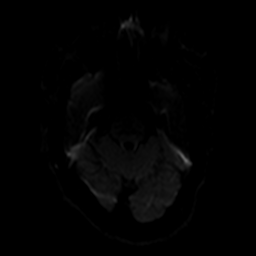
[im 84/184]
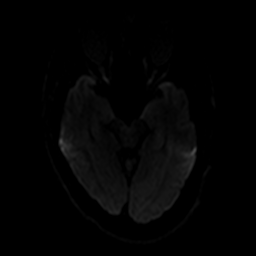
[im 100/184]
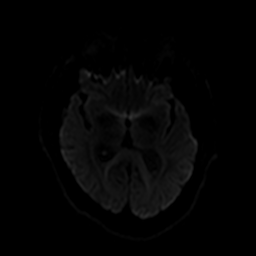
[im 117/184]
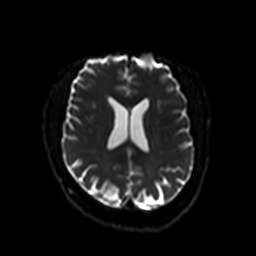
[im 134/184]
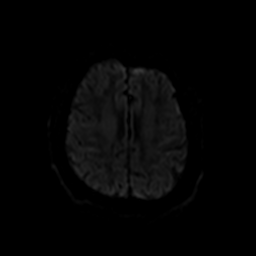
[im 150/184]
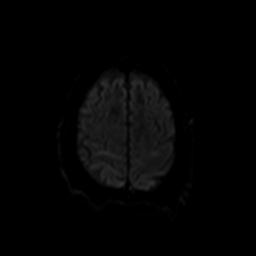
[im 167/184]
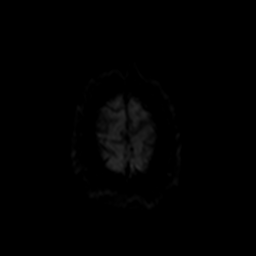
[im 184/184]
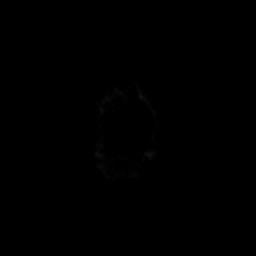

[Series 7: ax dwi_tracew · axial · 3.0mm · 0.94mm/px · z∈[-79,+83]mm · 6 of 92 slices shown]
[im 1/92]
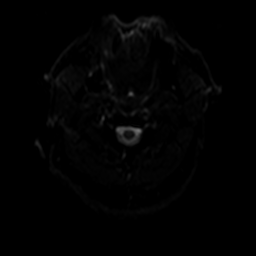
[im 19/92]
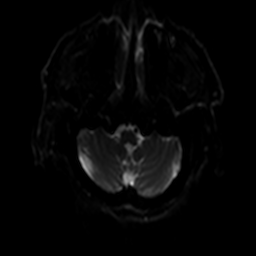
[im 37/92]
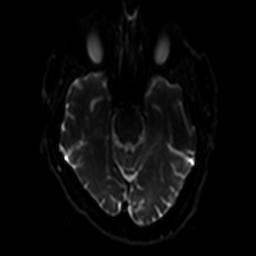
[im 55/92]
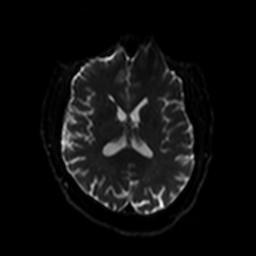
[im 73/92]
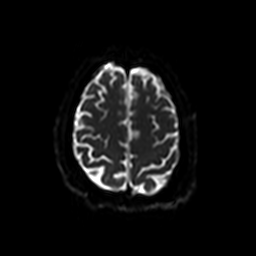
[im 92/92]
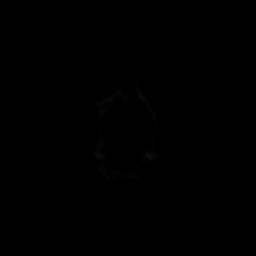

[Series 8: ax dwi_adc · axial · 3.0mm · 0.94mm/px · z∈[-79,+83]mm · 3 of 45 slices shown]
[im 1/45]
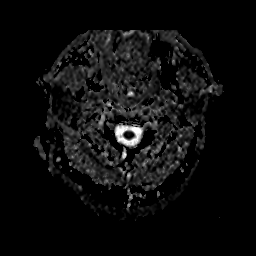
[im 23/45]
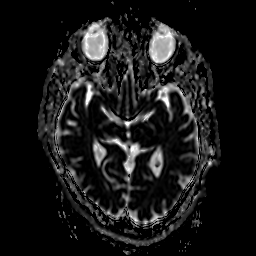
[im 45/45]
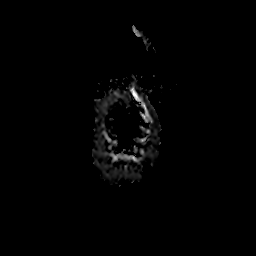

[Series 9: T2 · axial · 4.0mm · 0.36mm/px · z∈[-68,+93]mm · 2 of 32 slices shown]
[im 1/32]
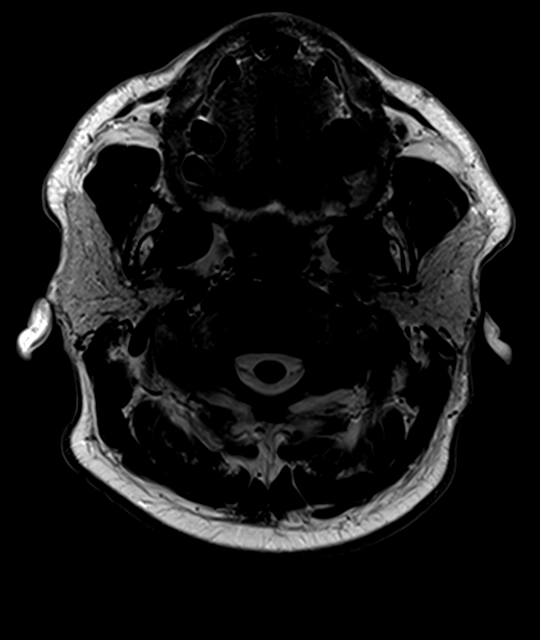
[im 32/32]
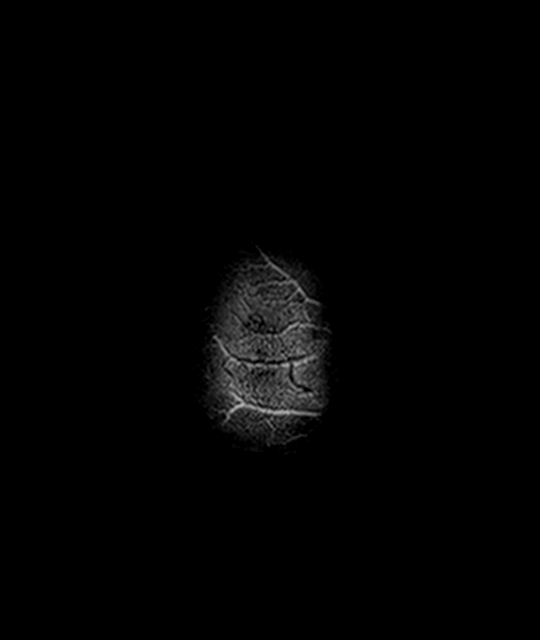

[Series 10: FLAIR · axial · 3.0mm · 0.72mm/px · z∈[-68,+94]mm · 2 of 28 slices shown]
[im 1/28]
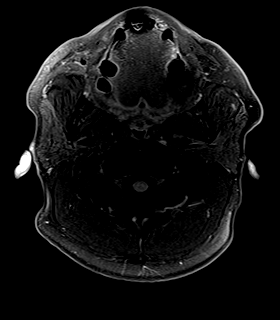
[im 28/28]
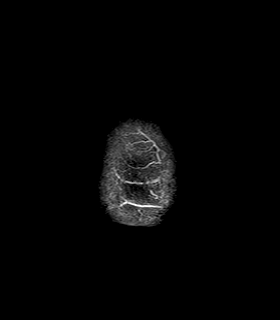

[Series 12: swi_images · axial · 3.0mm · 0.90mm/px · z∈[-64,+89]mm · 3 of 52 slices shown]
[im 1/52]
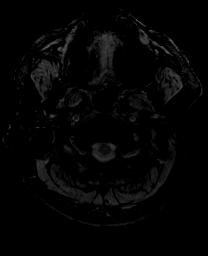
[im 26/52]
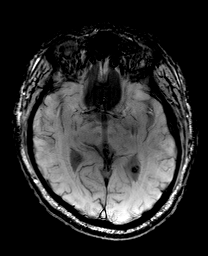
[im 52/52]
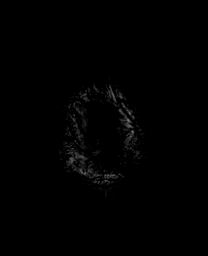

[Series 13: T1 · coronal · 3.0mm · 0.56mm/px · 1 of 13 slices shown (2 of 3)]
[im 1/13]
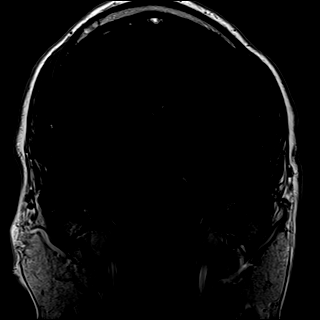

[Series 15: T1 · axial · 3.0mm · 0.50mm/px · 1 of 13 slices shown (3 of 3)]
[im 1/13]
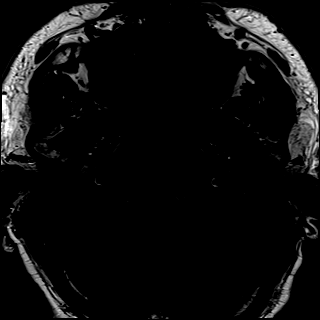

[Series 16: T1 post-contrast · coronal · 3.0mm · 0.56mm/px · 1 of 13 slices shown (1 of 3)]
[im 1/13]
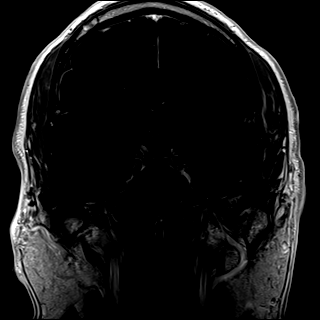

[Series 17: T1 post-contrast · axial · 3.0mm · 0.50mm/px · 1 of 13 slices shown (2 of 3)]
[im 1/13]
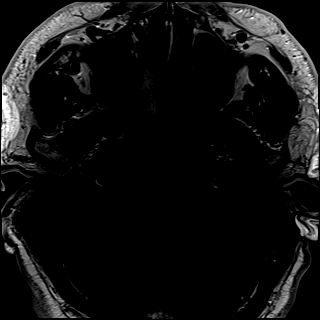

[Series 18: T1 post-contrast · axial · 1.0mm · 0.90mm/px · z∈[-67,+92]mm · 11 of 160 slices shown (3 of 3)]
[im 1/160]
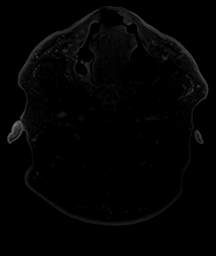
[im 16/160]
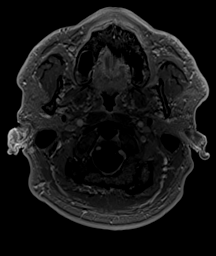
[im 32/160]
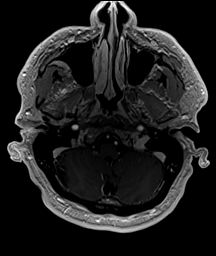
[im 48/160]
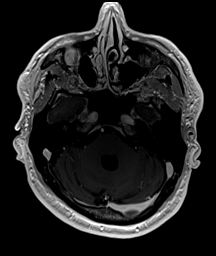
[im 64/160]
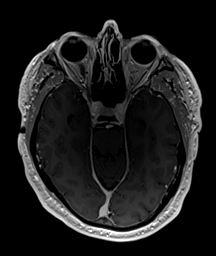
[im 80/160]
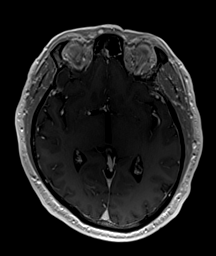
[im 96/160]
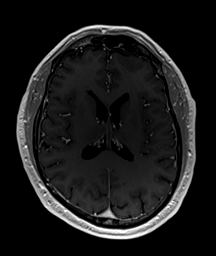
[im 112/160]
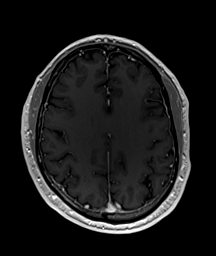
[im 128/160]
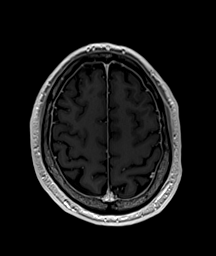
[im 144/160]
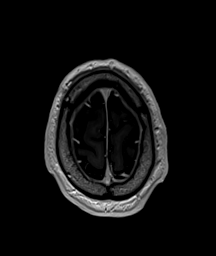
[im 160/160]
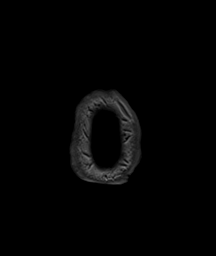

[45 of 48 positions shown; findings below may reference images not displayed]

FINDINGS: Brain: IAC protocol. Seventh and eighth cranial nerves normal.
Negative for vestibular schwannoma. Basilar cisterns normal.
Brainstem and cerebellum normal. No enhancing mass in the temporal
bone or posterior fossa

Ventricle size normal. Negative for acute or chronic infarct.
Negative for hemorrhage or mass. Normal enhancement of the brain.

Vascular: Normal arterial flow voids.  Normal venous enhancement.

Skull and upper cervical spine: Negative

Sinuses/Orbits: Negative

Other: None
IMPRESSION: No cause for tinnitus identified

Normal MRI head with and without contrast.

## 2022-11-25 ENCOUNTER — Other Ambulatory Visit: Payer: Self-pay | Admitting: Family Medicine

## 2022-11-25 DIAGNOSIS — E291 Testicular hypofunction: Secondary | ICD-10-CM

## 2022-11-26 ENCOUNTER — Other Ambulatory Visit: Payer: Medicare Other

## 2022-11-26 DIAGNOSIS — E291 Testicular hypofunction: Secondary | ICD-10-CM | POA: Diagnosis not present

## 2022-11-27 ENCOUNTER — Encounter: Payer: Self-pay | Admitting: Family Medicine

## 2022-11-27 LAB — HEMATOCRIT: Hematocrit: 48.9 % (ref 37.5–51.0)

## 2022-11-27 LAB — TESTOSTERONE: Testosterone: 380 ng/dL (ref 264–916)

## 2022-12-30 ENCOUNTER — Telehealth: Payer: Self-pay

## 2022-12-30 ENCOUNTER — Other Ambulatory Visit: Payer: Self-pay | Admitting: *Deleted

## 2022-12-30 DIAGNOSIS — N5201 Erectile dysfunction due to arterial insufficiency: Secondary | ICD-10-CM

## 2022-12-30 NOTE — Telephone Encounter (Signed)
Talked to patient and he states he takes one tab every week. He states its for enlarge prostate but also use it for ED .

## 2022-12-30 NOTE — Telephone Encounter (Signed)
Call left on VM. 857am.  Pt would like a refill of tadalafil (CIALIS) 5 MG tablet  sent to [facility] in [place].   Pls advise.   Pt aware we have 48h to respond.

## 2022-12-31 MED ORDER — TADALAFIL 5 MG PO TABS: 5.0000 mg | ORAL_TABLET | Freq: Every day | ORAL | 3 refills | Status: DC

## 2022-12-31 NOTE — Telephone Encounter (Signed)
Can send in 90 tabs/3 refills.  1 tab daily.  Please check with patient as this is going to be more expensive at [facility].  At [facility] or [facility] the cost is around $45 for 90 tabs using good Rx

## 2022-12-31 NOTE — Telephone Encounter (Signed)
Notified patient as instructed, patient pleased . Medication sent to [facility] .

## 2022-12-31 NOTE — Addendum Note (Signed)
Addended by: Levada Schilling on: 12/31/2022 01:26 PM   Modules accepted: Orders

## 2023-01-02 MED ORDER — TADALAFIL 5 MG PO TABS: 5.0000 mg | ORAL_TABLET | Freq: Every day | ORAL | 3 refills | Status: DC

## 2023-01-02 NOTE — Telephone Encounter (Signed)
Pt LM stating that rx for tadalafil should be sent to [facility] in [place], not [facility]. RX sent, unclear if patient was previously made aware of cost.

## 2023-01-02 NOTE — Addendum Note (Signed)
Addended by: Frankey Shown on: 01/02/2023 01:39 PM   Modules accepted: Orders

## 2023-01-09 ENCOUNTER — Ambulatory Visit (INDEPENDENT_AMBULATORY_CARE_PROVIDER_SITE_OTHER): Payer: Medicare Other | Admitting: Primary Care

## 2023-01-09 ENCOUNTER — Encounter: Payer: Self-pay | Admitting: Primary Care

## 2023-01-09 VITALS — BP 110/80 | HR 65 | Temp 97.3°F | Ht 69.0 in | Wt 225.8 lb

## 2023-01-09 DIAGNOSIS — G4733 Obstructive sleep apnea (adult) (pediatric): Secondary | ICD-10-CM

## 2023-01-09 NOTE — Progress Notes (Signed)
Reviewed and agree with assessment/plan.   Jannell Franta, MD [facility] 01/09/2023, 10:23 AM Pager:  [phone]  

## 2023-01-09 NOTE — Assessment & Plan Note (Signed)
-   Well controlled; Patient has severe OSA, AHI 25/hour. He is 100% compliant with CPAP >4 hours last 30 days and reports benefit from use. No issues with mask fit or pressure settings. Pressure 5-20cm h20; Residual AHI 1.9/hour. Renew CPAP supplies. Encourage patient continue to wear CPAP nightly 4-6 hours or longer. Advised against driving if experiencing excessive daytime sleepiness/fatigue, encourage weight loss and recommend caution with alcohol use/sedative prior to bedtime as these can worsen underlying sleep apnea. FU in 1 year or sooner if needed.

## 2023-01-09 NOTE — Patient Instructions (Addendum)
Excellent compliance with CPAP Sleep apnea is well controlled with current pressure settings No changes today We will renew CPAP supplies- let us know if you have not received  Continue to wear CPAP nightly 4-6 hours or longer  Dont drive if experiencing excessive daytime sleepiness  Continue to work on weight loss efforts as able  Caution alcohol use/sedative prior to bedtime as these can worsen sleep apnea   Follow-up 1 year with Beth NP   CPAP and BIPAP Information CPAP and BIPAP are methods that use air pressure to keep your airways open and to help you breathe well. CPAP and BIPAP use different amounts of pressure. Your health care provider will tell you whether CPAP or BIPAP would be more helpful for you. CPAP stands for "continuous positive airway pressure." With CPAP, the amount of pressure stays the same while you breathe in (inhale) and out (exhale). BIPAP stands for "bi-level positive airway pressure." With BIPAP, the amount of pressure will be higher when you inhale and lower when you exhale. This allows you to take larger breaths. CPAP or BIPAP may be used in the hospital, or your health care provider may want you to use it at home. You may need to have a sleep study before your health care provider can order a machine for you to use at home. What are the advantages? CPAP or BIPAP can be helpful if you have: Sleep apnea. Chronic obstructive pulmonary disease (COPD). Heart failure. Medical conditions that cause muscle weakness, including muscular dystrophy or amyotrophic lateral sclerosis (ALS). Other problems that cause breathing to be shallow, weak, abnormal, or difficult. CPAP and BIPAP are most commonly used for obstructive sleep apnea (OSA) to keep the airways from collapsing when the muscles relax during sleep. What are the risks? Generally, this is a safe treatment. However, problems may occur, including: Irritated skin or skin sores if the mask does not fit  properly. Dry or stuffy nose or nosebleeds. Dry mouth. Feeling gassy or bloated. Sinus or lung infection if the equipment is not cleaned properly. When should CPAP or BIPAP be used? In most cases, the mask only needs to be worn during sleep. Generally, the mask needs to be worn throughout the night and during any daytime naps. People with certain medical conditions may also need to wear the mask at other times, such as when they are awake. Follow instructions from your health care provider about when to use the machine. What happens during CPAP or BIPAP?  Both CPAP and BIPAP are provided by a small machine with a flexible plastic tube that attaches to a plastic mask that you wear. Air is blown through the mask into your nose or mouth. The amount of pressure that is used to blow the air can be adjusted on the machine. Your health care provider will set the pressure setting and help you find the best mask for you. Tips for using the mask Because the mask needs to be snug, some people feel trapped or closed-in (claustrophobic) when first using the mask. If you feel this way, you may need to get used to the mask. One way to do this is to hold the mask loosely over your nose or mouth and then gradually apply the mask more snugly. You can also gradually increase the amount of time that you use the mask. Masks are available in various types and sizes. If your mask does not fit well, talk with your health care provider about getting a different one. Some  common types of masks include: Full face masks, which fit over the mouth and nose. Nasal masks, which fit over the nose. Nasal pillow or prong masks, which fit into the nostrils. If you are using a mask that fits over your nose and you tend to breathe through your mouth, a chin strap may be applied to help keep your mouth closed. Use a skin barrier to protect your skin as told by your health care provider. Some CPAP and BIPAP machines have alarms that may  sound if the mask comes off or develops a leak. If you have trouble with the mask, it is very important that you talk with your health care provider about finding a way to make the mask easier to tolerate. Do not stop using the mask. There could be a negative impact on your health if you stop using the mask. Tips for using the machine Place your CPAP or BIPAP machine on a secure table or stand near an electrical outlet. Know where the on/off switch is on the machine. Follow instructions from your health care provider about how to set the pressure on your machine and when you should use it. Do not eat or drink while the CPAP or BIPAP machine is on. Food or fluids could get pushed into your lungs by the pressure of the CPAP or BIPAP. For home use, CPAP and BIPAP machines can be rented or purchased through home health care companies. Many different brands of machines are available. Renting a machine before purchasing may help you find out which particular machine works well for you. Your health insurance company may also decide which machine you may get. Keep the CPAP or BIPAP machine and attachments clean. Ask your health care provider for specific instructions. Check the humidifier if you have a dry stuffy nose or nosebleeds. Make sure it is working correctly. Follow these instructions at home: Take over-the-counter and prescription medicines only as told by your health care provider. Ask if you can take sinus medicine if your sinuses are blocked. Do not use any products that contain nicotine or tobacco. These products include cigarettes, chewing tobacco, and vaping devices, such as e-cigarettes. If you need help quitting, ask your health care provider. Keep all follow-up visits. This is important. Contact a health care provider if: You have redness or pressure sores on your head, face, mouth, or nose from the mask or head gear. You have trouble using the CPAP or BIPAP machine. You cannot tolerate  wearing the CPAP or BIPAP mask. Someone tells you that you snore even when wearing your CPAP or BIPAP. Get help right away if: You have trouble breathing. You feel confused. Summary CPAP and BIPAP are methods that use air pressure to keep your airways open and to help you breathe well. If you have trouble with the mask, it is very important that you talk with your health care provider about finding a way to make the mask easier to tolerate. Do not stop using the mask. There could be a negative impact to your health if you stop using the mask. Follow instructions from your health care provider about when to use the machine. This information is not intended to replace advice given to you by your health care provider. Make sure you discuss any questions you have with your health care provider. Document Revised: 03/06/2021 Document Reviewed: 07/06/2020 Elsevier Patient Education  2023 [employer].

## 2023-01-09 NOTE — Progress Notes (Signed)
@Patient  ID: Cody Pearson, male    DOB: [birth year], 72 y.o.   MRN: [id number]  Chief Complaint  Patient presents with   Follow-up    CPAP is good. Mask and pressure are good.     Referring provider: Saralyn Pilar *  HPI: 72 year old, never smoked. PMH significant for  OSA, hyperlipidemia, borderline diabetes. Patient of Dr. Craige Cotta, seen on 12/05/20 for sleep consult. Home sleep study on 12/13/20 showed severe OSA, average AHI 35.9/hour. DME company is [employer].   Previous [facility] pulmonary encounter:  12/05/2020 Patient presents today for sleep consults. He reports symptoms of snoring, restless sleep and witness apnea. No prior sleep study. He goes to bed between 10:30-11pm and gets out of bed at 6am. He wakes up three times at night to use restroom. His weight is up 10-20lbs. Goal would be around 190-200lbs. He had symptoms prior to weight gain. His son has sleep apnea.   Sleep questionnaire Prior sleep study- None Symptoms- Restless sleep, witness apnea, loud snoring  Bedtime-10:30-11pm Nocturnal awakenings- 3 Time out of bed in morning- 6am Epworth score- 3  02/19/2021 Patient contacted today to review sleep study results. Patient continues to have symptoms of apnea which has been waking him up at night. He had a home sleep study on 12/13/20 that showed severe OSA, average AHI 35.9 with SpO2 low 77%. We reviewed sleep study results and discussed treatment options including weight loss, side sleeping position, CPAP or referral to ENT for possible surgical options. Due to severity of his sleep apnea recommending he be started on CPAP therapy, he is agreeing to proceed with this. He will be retiring end of July, he has [employer] operational [employer] for insurance.   06/26/2021 Patient presents today for 3-4 month follow-up after CPAP start. He is doing well, it took him a little bit of time to get used to wearing CPAP. No issues with mask or pressure setting. He is 100% complaint with use. He uses full face  mask. He is sleeping though the night, wakes up on average 1-2 times a night to use restroom. He does feel somewhat more rested in the morning. He is still taking naps during the day, mostly on weekends. His wife is happier since he starting wearing CPAP as he is no longer snoring.   Airview download 05/27/21-06/25/21 Usage 30/30 days used (100%); 93% > 4 hours Average usage 7 hours 16 mins Pressure 5-20cm h20 (12.1cm h20- 95%) Airleaks 6.3L/min (95%) AHI 1.6   01/09/2023- Interim hx  Patient presents today for OV follow-up for sleep apnea. He had a home sleep study on 12/13/20 that showed severe OSA, average AHI 35.9 with SpO2 low 77%. He is doing well today. Tolerating CPAP and reports benefit from use. No longer snoring and feels better during the day. He is compliant with CPAP use. No issues mask or pressure. He does not use humidification. Needs cpap supplies renewed.  Airview download 12/09/22-01/07/23 Usage 30/30 days; 100% > 4 hours Average usage 8 hours 1 min Pressure 5-20cm h20 (12.8cm h20-95%) Airleaks 10.2L/min (95%) AHI 1.9     Allergies  Allergen Reactions   Penicillins     nausea   Amoxicillin Nausea Only    Nausea, anorexia, body aches    Immunization History  Administered Date(s) Administered   Fluad Quad(high Dose 65+) 05/25/2020   Influenza,inj,Quad PF,6+ Mos 05/12/2017   Influenza-Unspecified 06/06/2022   Moderna Sars-Covid-2 Vaccination 09/19/2019, 10/17/2019, 06/22/2020   Pneumococcal Conjugate-13 04/04/2019   Pneumococcal Polysaccharide-23 04/20/2020  Tdap 12/12/2010    Past Medical History:  Diagnosis Date   Actinic keratosis 03/28/2020   Arthritis    Cancer (HCC)    melanoma   History of colonic polyps    Hyperlipidemia    Melanoma (HCC) ~2017   scalp, txted at [facility]   Sleep apnea    hasn't started using CPAP yet    Tobacco History: Social History   Tobacco Use  Smoking Status Never  Smokeless Tobacco Never   Counseling given: Not  Answered   Outpatient Medications Prior to Visit  Medication Sig Dispense Refill   acetaminophen (TYLENOL) 650 MG CR tablet Take 650 mg by mouth every 8 (eight) hours as needed for pain.     atorvastatin (LIPITOR) 20 MG tablet Take 1 tablet (20 mg total) by mouth daily. For cholesterol. 90 tablet 3   ciclopirox (LOPROX) 0.77 % SUSP Apply 1 application topically at bedtime. 4 mL 2   ketoconazole (NIZORAL) 2 % cream Apply 1 Application topically daily.     Multiple Vitamins-Minerals (CENTRUM SILVER PO) Take 1 tablet by mouth daily.     tadalafil (CIALIS) 5 MG tablet Take 1 tablet (5 mg total) by mouth daily. 90 tablet 3   Testosterone 20 % CREA by Does not apply route.     CLINDAMYCIN HCL PO Take by mouth. Prior to dental procedures     No facility-administered medications prior to visit.   Review of Systems  Review of Systems  Constitutional: Negative.  Negative for fatigue.  HENT: Negative.    Respiratory: Negative.    Cardiovascular: Negative.   Psychiatric/Behavioral:  Negative for sleep disturbance.    Physical Exam  BP 110/80 (BP Location: Left Arm, Cuff Size: Large)   Pulse 65   Temp (!) 97.3 F (36.3 C)   Ht 5\' 9"  (1.753 m)   Wt 225 lb 12.8 oz (102.4 kg)   BMI 33.34 kg/m  Physical Exam Constitutional:      General: He is not in acute distress.    Appearance: Normal appearance. He is not ill-appearing.  HENT:     Head: Normocephalic and atraumatic.     Mouth/Throat:     Mouth: Mucous membranes are moist.     Pharynx: Oropharynx is clear.  Cardiovascular:     Rate and Rhythm: Normal rate and regular rhythm.  Pulmonary:     Effort: Pulmonary effort is normal.     Breath sounds: Normal breath sounds.  Musculoskeletal:        General: Normal range of motion.  Skin:    General: Skin is warm and dry.  Neurological:     General: No focal deficit present.     Mental Status: He is alert and oriented to person, place, and time. Mental status is at baseline.   Psychiatric:        Mood and Affect: Mood normal.        Behavior: Behavior normal.        Thought Content: Thought content normal.        Judgment: Judgment normal.      Lab Results:  CBC    Component Value Date/Time   WBC 5.2 07/11/2022 0829   RBC 5.19 07/11/2022 0829   HGB 16.3 07/11/2022 0829   HCT 48.9 11/26/2022 0917   PLT 204 07/11/2022 0829   MCV 93.8 07/11/2022 0829   MCH 31.4 07/11/2022 0829   MCHC 33.5 07/11/2022 0829   RDW 13.1 07/11/2022 0829   LYMPHSABS 1,295 07/11/2022 0829  MONOABS 1.1 (H) 10/14/2017 0301   EOSABS 182 07/11/2022 0829   BASOSABS 31 07/11/2022 0829    BMET    Component Value Date/Time   NA 141 07/11/2022 0829   K 4.5 07/11/2022 0829   CL 105 07/11/2022 0829   CO2 27 07/11/2022 0829   GLUCOSE 110 (H) 07/11/2022 0829   BUN 12 07/11/2022 0829   CREATININE 1.26 07/11/2022 0829   CALCIUM 9.4 07/11/2022 0829   GFRNONAA >60 10/14/2017 0301   GFRAA >60 10/14/2017 0301    BNP No results found for: "BNP"  ProBNP No results found for: "PROBNP"  Imaging: No results found.   Assessment & Plan:   OSA on CPAP - Well controlled; Patient has severe OSA, AHI 25/hour. He is 100% compliant with CPAP >4 hours last 30 days and reports benefit from use. No issues with mask fit or pressure settings. Pressure 5-20cm h20; Residual AHI 1.9/hour. Renew CPAP supplies. Encourage patient continue to wear CPAP nightly 4-6 hours or longer. Advised against driving if experiencing excessive daytime sleepiness/fatigue, encourage weight loss and recommend caution with alcohol use/sedative prior to bedtime as these can worsen underlying sleep apnea. FU in 1 year or sooner if needed.    Glenford Bayley, NP 01/09/2023

## 2023-01-16 ENCOUNTER — Other Ambulatory Visit: Payer: Medicare Other

## 2023-01-16 DIAGNOSIS — E782 Mixed hyperlipidemia: Secondary | ICD-10-CM | POA: Diagnosis not present

## 2023-01-16 DIAGNOSIS — R7309 Other abnormal glucose: Secondary | ICD-10-CM

## 2023-01-16 DIAGNOSIS — R7989 Other specified abnormal findings of blood chemistry: Secondary | ICD-10-CM

## 2023-01-17 LAB — HEMOGLOBIN A1C
Hgb A1c MFr Bld: 5.8 % of total Hgb — ABNORMAL HIGH (ref <5.7)
Mean Plasma Glucose: 120 mg/dL
eAG (mmol/L): 6.6 mmol/L

## 2023-01-17 LAB — T4, FREE: Free T4: 0.8 ng/dL (ref 0.8–1.8)

## 2023-01-17 LAB — TSH: TSH: 5.67 mIU/L — ABNORMAL HIGH (ref 0.40–4.50)

## 2023-01-23 ENCOUNTER — Ambulatory Visit (INDEPENDENT_AMBULATORY_CARE_PROVIDER_SITE_OTHER): Payer: Medicare Other | Admitting: Family Medicine

## 2023-01-23 ENCOUNTER — Encounter: Payer: Self-pay | Admitting: Family Medicine

## 2023-01-23 VITALS — BP 126/84 | HR 68 | Temp 96.8°F | Wt 226.0 lb

## 2023-01-23 DIAGNOSIS — R3912 Poor urinary stream: Secondary | ICD-10-CM | POA: Diagnosis not present

## 2023-01-23 DIAGNOSIS — E039 Hypothyroidism, unspecified: Secondary | ICD-10-CM | POA: Diagnosis not present

## 2023-01-23 DIAGNOSIS — R7309 Other abnormal glucose: Secondary | ICD-10-CM | POA: Diagnosis not present

## 2023-01-23 DIAGNOSIS — N401 Enlarged prostate with lower urinary tract symptoms: Secondary | ICD-10-CM

## 2023-01-23 DIAGNOSIS — G4733 Obstructive sleep apnea (adult) (pediatric): Secondary | ICD-10-CM

## 2023-01-23 MED ORDER — LEVOTHYROXINE SODIUM 25 MCG PO TABS
25.0000 ug | ORAL_TABLET | Freq: Every day | ORAL | 0 refills | Status: DC
Start: 2023-01-23 — End: 2023-04-08

## 2023-01-23 NOTE — Assessment & Plan Note (Signed)
Well controlled, chronic OSA on CPAP - Good adherence to CPAP nightly - Continue current CPAP therapy, patient seems to be benefiting from therapy  

## 2023-01-23 NOTE — Progress Notes (Unsigned)
Subjective:    Patient ID: Cody Pearson, male    DOB: 09-06-1950, 72 y.o.   MRN: [id number]  Cody Pearson is a 72 y.o. male presenting on 01/23/2023 for Hyperlipidemia   HPI  OSA on CPAP - Patient reports prior history of dx OSA and on CPAP, prior to treatment initial symptoms were snoring, daytime sleepiness and fatigue, has had several sleep studies in the past. Most recent followed by [facility] - with sleep management  - Today reports that sleep apnea is well controlled. He uses the CPAP machine every night. Tolerates the machine well, and thinks that sleeps better with it and feels good. No new concerns or symptoms.   BPH ED Hypogonadism Followed by Dr Loma Sender, previously [facility] Continues to take Testosterone cream topical On Tadalafil 5mg  daily for both BPH and for ED  Left Knee Arthritis Pain Episodic flare with symptoms pain Using knee brace, Voltaren Gel and walking regularly Improved pain. But it is still a bit sore. But overall improved Has not had X-ray   S/p Partial Knee Replacement R Knee Takes antibiotic prophylaxis Clindamycin (prior had allergic reaction and nausea vomiting on Amoxicillin) prior to dentist   Elevated A1c / Mild Pre Diabetes A1c 5.8 (same as previous 5.8), prior 5.7 He is limiting excess sweets.    Hypothyroidism (suspected), Elevated TSH Lab result shows TSH improved 5.72 > 5.67 and Free T4 0.8 borderline low He feels tired and sluggish and wants to try med     Health Maintenance:   Colonoscopy completed 03/2021, next repeat 7 years.      01/23/2023    9:40 AM 07/17/2022   10:14 AM 04/28/2022    1:31 PM  Depression screen PHQ 2/9  Decreased Interest 0 0 0  Down, Depressed, Hopeless 0 0 0  PHQ - 2 Score 0 0 0  Altered sleeping 1 0 0  Tired, decreased energy 0 0 0  Change in appetite 0 0 0  Feeling bad or failure about yourself  0 0 0  Trouble concentrating 0 0 0  Moving slowly or  fidgety/restless 0 0 0  Suicidal thoughts 0 0 0  PHQ-9 Score 1 0 0  Difficult doing work/chores Not difficult at all Not difficult at all Not difficult at all    Social History   Tobacco Use   Smoking status: Never   Smokeless tobacco: Never  Vaping Use   Vaping Use: Never used  Substance Use Topics   Alcohol use: Yes    Comment: wine occ   Drug use: No    Review of Systems Per HPI unless specifically indicated above     Objective:    BP 126/84 (BP Location: Right Arm, Patient Position: Sitting, Cuff Size: Normal)   Pulse 68   Temp (!) 96.8 F (36 C) (Temporal)   Wt 226 lb (102.5 kg)   SpO2 96%   BMI 33.37 kg/m   Wt Readings from Last 3 Encounters:  01/23/23 226 lb (102.5 kg)  01/09/23 225 lb 12.8 oz (102.4 kg)  08/27/22 215 lb (97.5 kg)    Physical Exam Vitals and nursing note reviewed.  Constitutional:      General: He is not in acute distress.    Appearance: He is well-developed. He is not diaphoretic.     Comments: Well-appearing, comfortable, cooperative  HENT:     Head: Normocephalic and atraumatic.  Eyes:     General:  Right eye: No discharge.        Left eye: No discharge.     Conjunctiva/sclera: Conjunctivae normal.  Neck:     Thyroid: No thyromegaly.     Comments: No thyromegaly Cardiovascular:     Rate and Rhythm: Normal rate and regular rhythm.     Pulses: Normal pulses.     Heart sounds: Normal heart sounds. No murmur heard. Pulmonary:     Effort: Pulmonary effort is normal. No respiratory distress.     Breath sounds: Normal breath sounds. No wheezing or rales.  Musculoskeletal:        General: Normal range of motion.     Cervical back: Normal range of motion and neck supple.     Right lower leg: No edema.     Left lower leg: No edema.  Lymphadenopathy:     Cervical: No cervical adenopathy.  Skin:    General: Skin is warm and dry.     Findings: No erythema or rash.  Neurological:     Mental Status: He is alert and oriented to  person, place, and time. Mental status is at baseline.  Psychiatric:        Behavior: Behavior normal.     Comments: Well groomed, good eye contact, normal speech and thoughts      Results for orders placed or performed in visit on 01/16/23  Hemoglobin A1c  Result Value Ref Range   Hgb A1c MFr Bld 5.8 (H) <5.7 % of total Hgb   Mean Plasma Glucose 120 mg/dL   eAG (mmol/L) 6.6 mmol/L  T4, free  Result Value Ref Range   Free T4 0.8 0.8 - 1.8 ng/dL  TSH  Result Value Ref Range   TSH 5.67 (H) 0.40 - 4.50 mIU/L      Assessment & Plan:   Problem List Items Addressed This Visit     Benign prostatic hyperplasia with weak urinary stream    Followed by [facility] Dr Lonna Cobb On Tadalafil 5mg  daily, Testosterone for hypogonadism      Elevated hemoglobin A1c    Mild elevated A1c 5.8, stable from previous Concern with obesity, HTN, HLD  Plan:  1. Not on any therapy currently  2. Encourage improved lifestyle - low carb, low sugar diet, reduce portion size, continue improving regular exercise  Repeat 6 month      Hypothyroidism (acquired) - Primary    Suspected Hypothyroidism Lab with still elevated TSH, borderline low Free T4 and symptomatic Start Trial low dose Levothyroxine daily Check TSH T4 in 2.5 weeks and follow up on mychart      Relevant Medications   levothyroxine (SYNTHROID) 25 MCG tablet   Other Relevant Orders   TSH   T4, free   OSA on CPAP    Well controlled, chronic OSA on CPAP - Good adherence to CPAP nightly - Continue current CPAP therapy, patient seems to be benefiting from therapy        Meds ordered this encounter  Medications   levothyroxine (SYNTHROID) 25 MCG tablet    Sig: Take 1 tablet (25 mcg total) by mouth daily before breakfast.    Dispense:  90 tablet    Refill:  0   Orders Placed This Encounter  Procedures   TSH   T4, free     Follow up plan: Return in about 3 months (around 04/25/2023) for 2.5 month [facility] orders, Thyroid  follow-up with me if needed only..  Future labs ordered for [facility] TSH T4  Lyn Hollingshead  Althea Charon, DO [facility] 01/23/2023, 9:20 AM

## 2023-01-23 NOTE — Patient Instructions (Addendum)
Thank you for coming to the office today.  Start the Thyroid replacement. Separately take Levothyroxine daily EMPTY stomach, small amount of water, 15-30 min before breakfast / other medications.  [facility] Orders 2.5 weeks, before you run out of Levothyroxine 25 mcg  DUE for FASTING BLOOD WORK (no food or drink after midnight before the lab appointment, only water or coffee without cream/sugar on the morning of)  SCHEDULE "Lab Only" visit in the morning at the clinic for lab draw in 3 MONTHS    Please schedule a Follow-up Appointment to: Return in about 3 months (around 04/25/2023) for 2.5 month [facility] orders, Thyroid follow-up with me if needed only..  If you have any other questions or concerns, please feel free to call the office or send a message through MyChart. You may also schedule an earlier appointment if necessary.  Additionally, you may be receiving a survey about your experience at our office within a few days to 1 week by e-mail or mail. We value your feedback.  Saralyn Pilar, DO [facility], [place]

## 2023-01-23 NOTE — Assessment & Plan Note (Signed)
Mild elevated A1c 5.8, stable from previous Concern with obesity, HTN, HLD  Plan:  1. Not on any therapy currently  2. Encourage improved lifestyle - low carb, low sugar diet, reduce portion size, continue improving regular exercise  Repeat 6 month

## 2023-01-23 NOTE — Assessment & Plan Note (Signed)
Followed by [facility] Dr Lonna Cobb On Tadalafil 5mg  daily, Testosterone for hypogonadism

## 2023-01-23 NOTE — Assessment & Plan Note (Signed)
Suspected Hypothyroidism Lab with still elevated TSH, borderline low Free T4 and symptomatic Start Trial low dose Levothyroxine daily Check TSH T4 in 2.5 weeks and follow up on mychart

## 2023-01-28 ENCOUNTER — Telehealth: Payer: Self-pay | Admitting: Urology

## 2023-01-28 ENCOUNTER — Encounter: Payer: Self-pay | Admitting: Dermatology

## 2023-01-28 ENCOUNTER — Ambulatory Visit (INDEPENDENT_AMBULATORY_CARE_PROVIDER_SITE_OTHER): Payer: Medicare Other | Admitting: Dermatology

## 2023-01-28 VITALS — BP 126/84 | HR 70

## 2023-01-28 DIAGNOSIS — L814 Other melanin hyperpigmentation: Secondary | ICD-10-CM | POA: Diagnosis not present

## 2023-01-28 DIAGNOSIS — Z1283 Encounter for screening for malignant neoplasm of skin: Secondary | ICD-10-CM

## 2023-01-28 DIAGNOSIS — L821 Other seborrheic keratosis: Secondary | ICD-10-CM

## 2023-01-28 DIAGNOSIS — L820 Inflamed seborrheic keratosis: Secondary | ICD-10-CM

## 2023-01-28 DIAGNOSIS — D1801 Hemangioma of skin and subcutaneous tissue: Secondary | ICD-10-CM

## 2023-01-28 DIAGNOSIS — Z872 Personal history of diseases of the skin and subcutaneous tissue: Secondary | ICD-10-CM | POA: Diagnosis not present

## 2023-01-28 DIAGNOSIS — W908XXA Exposure to other nonionizing radiation, initial encounter: Secondary | ICD-10-CM

## 2023-01-28 DIAGNOSIS — L578 Other skin changes due to chronic exposure to nonionizing radiation: Secondary | ICD-10-CM | POA: Diagnosis not present

## 2023-01-28 DIAGNOSIS — Z85820 Personal history of malignant melanoma of skin: Secondary | ICD-10-CM | POA: Diagnosis not present

## 2023-01-28 DIAGNOSIS — L570 Actinic keratosis: Secondary | ICD-10-CM | POA: Diagnosis not present

## 2023-01-28 DIAGNOSIS — D229 Melanocytic nevi, unspecified: Secondary | ICD-10-CM

## 2023-01-28 NOTE — Patient Instructions (Addendum)
Seborrheic Keratosis  What causes seborrheic keratoses? Seborrheic keratoses are harmless, common skin growths that first appear during adult life.  As time goes by, more growths appear.  Some people may develop a large number of them.  Seborrheic keratoses appear on both covered and uncovered body parts.  They are not caused by sunlight.  The tendency to develop seborrheic keratoses can be inherited.  They vary in color from skin-colored to gray, brown, or even black.  They can be either smooth or have a rough, warty surface.   Seborrheic keratoses are superficial and look as if they were stuck on the skin.  Under the microscope this type of keratosis looks like layers upon layers of skin.  That is why at times the top layer may seem to fall off, but the rest of the growth remains and re-grows.    Treatment Seborrheic keratoses do not need to be treated, but can easily be removed in the office.  Seborrheic keratoses often cause symptoms when they rub on clothing or jewelry.  Lesions can be in the way of shaving.  If they become inflamed, they can cause itching, soreness, or burning.  Removal of a seborrheic keratosis can be accomplished by freezing, burning, or surgery. If any spot bleeds, scabs, or grows rapidly, please return to have it checked, as these can be an indication of a skin cancer.  Cryotherapy Aftercare  Wash gently with soap and water everyday.   Apply Vaseline and Band-Aid daily until healed.   Actinic keratoses are precancerous spots that appear secondary to cumulative UV radiation exposure/sun exposure over time. They are chronic with expected duration over 1 year. A portion of actinic keratoses will progress to squamous cell carcinoma of the skin. It is not possible to reliably predict which spots will progress to skin cancer and so treatment is recommended to prevent development of skin cancer.  Recommend daily broad spectrum sunscreen SPF 30+ to sun-exposed areas, reapply every  2 hours as needed.  Recommend staying in the shade or wearing long sleeves, sun glasses (UVA+UVB protection) and wide brim hats (4-inch brim around the entire circumference of the hat). Call for new or changing lesions.    Melanoma ABCDEs  Melanoma is the most dangerous type of skin cancer, and is the leading cause of death from skin disease.  You are more likely to develop melanoma if you: Have light-colored skin, light-colored eyes, or red or blond hair Spend a lot of time in the sun Tan regularly, either outdoors or in a tanning bed Have had blistering sunburns, especially during childhood Have a close family member who has had a melanoma Have atypical moles or large birthmarks  Early detection of melanoma is key since treatment is typically straightforward and cure rates are extremely high if we catch it early.   The first sign of melanoma is often a change in a mole or a new dark spot.  The ABCDE system is a way of remembering the signs of melanoma.  A for asymmetry:  The two halves do not match. B for border:  The edges of the growth are irregular. C for color:  A mixture of colors are present instead of an even brown color. D for diameter:  Melanomas are usually (but not always) greater than 6mm - the size of a pencil eraser. E for evolution:  The spot keeps changing in size, shape, and color.  Please check your skin once per month between visits. You can use a small   mirror in front and a large mirror behind you to keep an eye on the back side or your body.   If you see any new or changing lesions before your next follow-up, please call to schedule a visit.  Please continue daily skin protection including broad spectrum sunscreen SPF 30+ to sun-exposed areas, reapplying every 2 hours as needed when you're outdoors.   Staying in the shade or wearing long sleeves, sun glasses (UVA+UVB protection) and wide brim hats (4-inch brim around the entire circumference of the hat) are also  recommended for sun protection.     Due to recent changes in healthcare laws, you may see results of your pathology and/or laboratory studies on MyChart before the doctors have had a chance to review them. We understand that in some cases there may be results that are confusing or concerning to you. Please understand that not all results are received at the same time and often the doctors may need to interpret multiple results in order to provide you with the best plan of care or course of treatment. Therefore, we ask that you please give us 2 business days to thoroughly review all your results before contacting the office for clarification. Should we see a critical lab result, you will be contacted sooner.   If You Need Anything After Your Visit  If you have any questions or concerns for your doctor, please call our main line at [phone] and press option 4 to reach your doctor's medical assistant. If no one answers, please leave a voicemail as directed and we will return your call as soon as possible. Messages left after 4 pm will be answered the following business day.   You may also send us a message via MyChart. We typically respond to MyChart messages within 1-2 business days.  For prescription refills, please ask your pharmacy to contact our office. Our fax number is [phone].  If you have an urgent issue when the clinic is closed that cannot wait until the next business day, you can page your doctor at the number below.    Please note that while we do our best to be available for urgent issues outside of office hours, we are not available 24/7.   If you have an urgent issue and are unable to reach us, you may choose to seek medical care at your doctor's office, retail clinic, urgent care center, or emergency room.  If you have a medical emergency, please immediately call 911 or go to the emergency department.  Pager Numbers  - Dr. Kowalski: [phone]  - Dr. Moye:  [phone]  - Dr. Stewart: [phone]  In the event of inclement weather, please call our main line at [phone] for an update on the status of any delays or closures.  Dermatology Medication Tips: Please keep the boxes that topical medications come in in order to help keep track of the instructions about where and how to use these. Pharmacies typically print the medication instructions only on the boxes and not directly on the medication tubes.   If your medication is too expensive, please contact our office at [phone] option 4 or send us a message through MyChart.   We are unable to tell what your co-pay for medications will be in advance as this is different depending on your insurance coverage. However, we may be able to find a substitute medication at lower cost or fill out paperwork to get insurance to cover a needed medication.   If a prior   authorization is required to get your medication covered by your insurance company, please allow us 1-2 business days to complete this process.  Drug prices often vary depending on where the prescription is filled and some pharmacies may offer cheaper prices.  The website www.goodrx.com contains coupons for medications through different pharmacies. The prices here do not account for what the cost may be with help from insurance (it may be cheaper with your insurance), but the website can give you the price if you did not use any insurance.  - You can print the associated coupon and take it with your prescription to the pharmacy.  - You may also stop by our office during regular business hours and pick up a GoodRx coupon card.  - If you need your prescription sent electronically to a different pharmacy, notify our office through [facility] MyChart or by phone at [phone] option 4.     Si Usted Necesita Algo Despus de Su Visita  Tambin puede enviarnos un mensaje a travs de MyChart. Por lo general respondemos a los mensajes de  MyChart en el transcurso de 1 a 2 das hbiles.  Para renovar recetas, por favor pida a su farmacia que se ponga en contacto con nuestra oficina. Nuestro nmero de fax es el [phone].  Si tiene un asunto urgente cuando la clnica est cerrada y que no puede esperar hasta el siguiente da hbil, puede llamar/localizar a su doctor(a) al nmero que aparece a continuacin.   Por favor, tenga en cuenta que aunque hacemos todo lo posible para estar disponibles para asuntos urgentes fuera del horario de oficina, no estamos disponibles las 24 horas del da, los 7 das de la semana.   Si tiene un problema urgente y no puede comunicarse con nosotros, puede optar por buscar atencin mdica  en el consultorio de su doctor(a), en una clnica privada, en un centro de atencin urgente o en una sala de emergencias.  Si tiene una emergencia mdica, por favor llame inmediatamente al 911 o vaya a la sala de emergencias.  Nmeros de bper  - Dr. Kowalski: [phone]  - Dra. Moye: [phone]  - Dra. Stewart: [phone]  En caso de inclemencias del tiempo, por favor llame a nuestra lnea principal al [phone] para una actualizacin sobre el estado de cualquier retraso o cierre.  Consejos para la medicacin en dermatologa: Por favor, guarde las cajas en las que vienen los medicamentos de uso tpico para ayudarle a seguir las instrucciones sobre dnde y cmo usarlos. Las farmacias generalmente imprimen las instrucciones del medicamento slo en las cajas y no directamente en los tubos del medicamento.   Si su medicamento es muy caro, por favor, pngase en contacto con nuestra oficina llamando al [phone] y presione la opcin 4 o envenos un mensaje a travs de MyChart.   No podemos decirle cul ser su copago por los medicamentos por adelantado ya que esto es diferente dependiendo de la cobertura de su seguro. Sin embargo, es posible que podamos encontrar un medicamento sustituto a menor costo o  llenar un formulario para que el seguro cubra el medicamento que se considera necesario.   Si se requiere una autorizacin previa para que su compaa de seguros cubra su medicamento, por favor permtanos de 1 a 2 das hbiles para completar este proceso.  Los precios de los medicamentos varan con frecuencia dependiendo del lugar de dnde se surte la receta y alguna farmacias pueden ofrecer precios ms baratos.  El sitio web www.goodrx.com tiene cupones para   medicamentos de diferentes farmacias. Los precios aqu no tienen en cuenta lo que podra costar con la ayuda del seguro (puede ser ms barato con su seguro), pero el sitio web puede darle el precio si no utiliz ningn seguro.  - Puede imprimir el cupn correspondiente y llevarlo con su receta a la farmacia.  - Tambin puede pasar por nuestra oficina durante el horario de atencin regular y recoger una tarjeta de cupones de GoodRx.  - Si necesita que su receta se enve electrnicamente a una farmacia diferente, informe a nuestra oficina a travs de MyChart de  o por telfono llamando al [phone] y presione la opcin 4.  

## 2023-01-28 NOTE — Progress Notes (Signed)
Follow-Up Visit   Subjective  Cody Pearson is a 72 y.o. male who presents for the following: Skin Cancer Screening and Full Body Skin Exam Hx of melanoma at scalp 2017, hx of isks, hx of aks Reports itchy spot at back, spots at face and scalp.  The patient presents for Total-Body Skin Exam (TBSE) for skin cancer screening and mole check. The patient has spots, moles and lesions to be evaluated, some may be new or changing and the patient has concerns that these could be cancer.  The following portions of the chart were reviewed this encounter and updated as appropriate: medications, allergies, medical history  Review of Systems:  No other skin or systemic complaints except as noted in HPI or Assessment and Plan.  Objective  Well appearing patient in no apparent distress; mood and affect are within normal limits.  A full examination was performed including scalp, head, eyes, ears, nose, lips, neck, chest, axillae, abdomen, back, buttocks, bilateral upper extremities, bilateral lower extremities, hands, feet, fingers, toes, fingernails, and toenails. All findings within normal limits unless otherwise noted below.   Relevant physical exam findings are noted in the Assessment and Plan.  scalp x 1, forehead x 1, temple x 1 (3) Erythematous thin papules/macules with gritty scale.   face x 5  right zygoma x 1, forehead x 3, back x14, (23) Erythematous stuck-on, waxy papule or plaque   Assessment & Plan   LENTIGINES, SEBORRHEIC KERATOSES, HEMANGIOMAS - Benign normal skin lesions - Benign-appearing - Call for any changes  MELANOCYTIC NEVI - Tan-brown and/or pink-flesh-colored symmetric macules and papules - Benign appearing on exam today - Observation - Call clinic for new or changing moles - Recommend daily use of broad spectrum spf 30+ sunscreen to sun-exposed areas.   ACTINIC DAMAGE - Chronic condition, secondary to cumulative UV/sun exposure - diffuse scaly erythematous  macules with underlying dyspigmentation - Recommend daily broad spectrum sunscreen SPF 30+ to sun-exposed areas, reapply every 2 hours as needed.  - Staying in the shade or wearing long sleeves, sun glasses (UVA+UVB protection) and wide brim hats (4-inch brim around the entire circumference of the hat) are also recommended for sun protection.  - Call for new or changing lesions.  History of melanoma Scalp Treated ~ 2017 Mohs at [facility]. Observe for recurrence.  No lymphadenopathy.  call clinic for new or changing lesions.  Recommend regular skin exams, daily broad-spectrum spf 30+ sunscreen use, and photoprotection.    SKIN CANCER SCREENING PERFORMED TODAY.  Actinic keratosis (3) scalp x 1, forehead x 1, temple x 1  Actinic keratoses are precancerous spots that appear secondary to cumulative UV radiation exposure/sun exposure over time. They are chronic with expected duration over 1 year. A portion of actinic keratoses will progress to squamous cell carcinoma of the skin. It is not possible to reliably predict which spots will progress to skin cancer and so treatment is recommended to prevent development of skin cancer.  Recommend daily broad spectrum sunscreen SPF 30+ to sun-exposed areas, reapply every 2 hours as needed.  Recommend staying in the shade or wearing long sleeves, sun glasses (UVA+UVB protection) and wide brim hats (4-inch brim around the entire circumference of the hat). Call for new or changing lesions.  Destruction of lesion - scalp x 1, forehead x 1, temple x 1 Complexity: simple   Destruction method: cryotherapy   Informed consent: discussed and consent obtained   Timeout:  patient name, date of birth, surgical site, and procedure  verified Lesion destroyed using liquid nitrogen: Yes   Region frozen until ice ball extended beyond lesion: Yes   Outcome: patient tolerated procedure well with no complications   Post-procedure details: wound care instructions given     Inflamed seborrheic keratosis (23) face x 5  right zygoma x 1, forehead x 3, back x14,  Symptomatic, irritating, patient would like treated.  Destruction of lesion - face x 5  right zygoma x 1, forehead x 3, back x14, Complexity: simple   Destruction method: cryotherapy   Informed consent: discussed and consent obtained   Timeout:  patient name, date of birth, surgical site, and procedure verified Lesion destroyed using liquid nitrogen: Yes   Region frozen until ice ball extended beyond lesion: Yes   Outcome: patient tolerated procedure well with no complications   Post-procedure details: wound care instructions given     Return in about 1 year (around 01/28/2024) for TBSE.  IAsher Muir, CMA, am acting as scribe for Armida Sans, MD.  Documentation: I have reviewed the above documentation for accuracy and completeness, and I agree with the above.  Armida Sans, MD

## 2023-01-28 NOTE — Telephone Encounter (Signed)
Patient is requesting Testosterone Cream that was last ordered by Dr. Evelene Croon. Pharmacy is [facility] in chart.

## 2023-01-29 ENCOUNTER — Other Ambulatory Visit: Payer: Self-pay | Admitting: *Deleted

## 2023-01-29 MED ORDER — AMBULATORY NON FORMULARY MEDICATION: 2 refills | Status: DC

## 2023-01-29 NOTE — Telephone Encounter (Signed)
Please fax Rx testosterone cream 20% 1 cc [facility]

## 2023-01-30 ENCOUNTER — Encounter: Payer: Self-pay | Admitting: Dermatology

## 2023-02-06 ENCOUNTER — Other Ambulatory Visit: Payer: Self-pay | Admitting: Urology

## 2023-02-06 NOTE — Telephone Encounter (Signed)
Patient does not want to use compounding pharmacy he was using and would like medication sent to [facility]. Medication pulled for review and sending to Dr Lonna Cobb.

## 2023-02-06 NOTE — Addendum Note (Signed)
Addended by: Consuella Lose on: 02/06/2023 10:50 AM   Modules accepted: Orders

## 2023-02-06 NOTE — Telephone Encounter (Signed)
Patient called to request refill of Testosterone 20% cream. He would like it sent to [facility] in [place] on [address]

## 2023-02-09 ENCOUNTER — Other Ambulatory Visit: Payer: Self-pay | Admitting: Urology

## 2023-02-09 MED ORDER — TESTOSTERONE 20.25 MG/ACT (1.62%) TD GEL: TRANSDERMAL | 2 refills | Status: DC

## 2023-02-10 MED ORDER — AMBULATORY NON FORMULARY MEDICATION: 2 refills | Status: DC

## 2023-02-10 MED ORDER — AMBULATORY NON FORMULARY MEDICATION: 2 refills | Status: AC

## 2023-02-10 NOTE — Addendum Note (Signed)
Addended by: Consuella Lose on: 02/10/2023 09:55 AM   Modules accepted: Orders

## 2023-02-10 NOTE — Telephone Encounter (Signed)
Spoke with patient today. He heard from the compounding pharmacy in [place] about his Testosterone cream RX and this will be mailed to him now. Next time he wants to use [facility]. I updated the medication in the chart but it was not sent in today.

## 2023-02-10 NOTE — Telephone Encounter (Signed)
Received a call from [facility] compounding pharmacy stating they do need a new RX re faxed to them as the one that we faxed a week or 2 ago did not come through. This was done today.

## 2023-02-10 NOTE — Addendum Note (Signed)
Addended by: Consuella Lose on: 02/10/2023 10:12 AM   Modules accepted: Orders

## 2023-03-16 ENCOUNTER — Telehealth: Payer: Self-pay

## 2023-03-16 DIAGNOSIS — E039 Hypothyroidism, unspecified: Secondary | ICD-10-CM

## 2023-03-16 NOTE — Telephone Encounter (Signed)
Copied from CRM [id number]. Topic: Appointment Scheduling - Scheduling Inquiry for Clinic >> Mar 16, 2023  9:37 AM Marlow Baars wrote: Reason for CRM: The patient called in to schedule a lab appt to check his thyroid and for a follow up to discuss those labs with Dr K a wk later. He is scheduled on 8/21 for labs and follow up on 8/28. The patient needs an order to get those labs done on 8/21. Please assist patient further

## 2023-03-16 NOTE — Telephone Encounter (Signed)
It looks like the TSH and free T4 have already been ordered

## 2023-03-24 NOTE — Telephone Encounter (Signed)
Re ordered labs just in case not able to be drawn on 8.21, looks like they were already released back on 6/14  Regardless, labs are ready, his apt is already scheduled  Saralyn Pilar, DO [facility] 03/24/2023, 12:49 PM

## 2023-03-24 NOTE — Addendum Note (Signed)
Addended by: Smitty Cords on: 03/24/2023 12:49 PM   Modules accepted: Orders

## 2023-04-01 ENCOUNTER — Ambulatory Visit: Payer: Medicare Other | Admitting: Family Medicine

## 2023-04-01 ENCOUNTER — Other Ambulatory Visit: Payer: Medicare Other

## 2023-04-01 DIAGNOSIS — E039 Hypothyroidism, unspecified: Secondary | ICD-10-CM | POA: Diagnosis not present

## 2023-04-02 LAB — T4, FREE: Free T4: 0.9 ng/dL (ref 0.8–1.8)

## 2023-04-02 LAB — TSH: TSH: 4.21 mIU/L (ref 0.40–4.50)

## 2023-04-08 ENCOUNTER — Ambulatory Visit (INDEPENDENT_AMBULATORY_CARE_PROVIDER_SITE_OTHER): Payer: Medicare Other | Admitting: Family Medicine

## 2023-04-08 ENCOUNTER — Encounter: Payer: Self-pay | Admitting: Family Medicine

## 2023-04-08 ENCOUNTER — Other Ambulatory Visit: Payer: Self-pay | Admitting: Family Medicine

## 2023-04-08 DIAGNOSIS — E039 Hypothyroidism, unspecified: Secondary | ICD-10-CM

## 2023-04-08 DIAGNOSIS — E785 Hyperlipidemia, unspecified: Secondary | ICD-10-CM

## 2023-04-08 DIAGNOSIS — R7309 Other abnormal glucose: Secondary | ICD-10-CM

## 2023-04-08 DIAGNOSIS — N401 Enlarged prostate with lower urinary tract symptoms: Secondary | ICD-10-CM

## 2023-04-08 DIAGNOSIS — E782 Mixed hyperlipidemia: Secondary | ICD-10-CM

## 2023-04-08 DIAGNOSIS — G4733 Obstructive sleep apnea (adult) (pediatric): Secondary | ICD-10-CM

## 2023-04-08 MED ORDER — ATORVASTATIN CALCIUM 20 MG PO TABS: 20.0000 mg | ORAL_TABLET | Freq: Every day | ORAL | 3 refills | Status: DC

## 2023-04-08 MED ORDER — LEVOTHYROXINE SODIUM 25 MCG PO TABS
25.0000 ug | ORAL_TABLET | Freq: Every day | ORAL | 3 refills | Status: DC
Start: 2023-04-08 — End: 2024-02-08

## 2023-04-08 NOTE — Assessment & Plan Note (Signed)
Improved Hypothyroidism Symptoms mild improved Resolved TSH and improved T4 Continue low dose Levothyroxine daily

## 2023-04-08 NOTE — Progress Notes (Signed)
Subjective:    Patient ID: Cody Pearson, male    DOB: 12/12/1950, 72 y.o.   MRN: [id number]  Cody Pearson is a 72 y.o. male presenting on 04/08/2023 for Medical Management of Chronic Issues   HPI  Hypothyroidism, follow-up Previously he had high TSH and some fatigue, he was started on initial dose levothyroxine daily Today he follows up, Thyroid results have returned to normal ranges. TSH down to 4.21 from 5.67 and Free T4 up to 0.9  He feels slightly improved, better energy  Hyperlipidemia On Atorvastatin 20mg , will need refill  Health Maintenance: Due for Flu Shot next month     01/23/2023    9:40 AM 07/17/2022   10:14 AM 04/28/2022    1:31 PM  Depression screen PHQ 2/9  Decreased Interest 0 0 0  Down, Depressed, Hopeless 0 0 0  PHQ - 2 Score 0 0 0  Altered sleeping 1 0 0  Tired, decreased energy 0 0 0  Change in appetite 0 0 0  Feeling bad or failure about yourself  0 0 0  Trouble concentrating 0 0 0  Moving slowly or fidgety/restless 0 0 0  Suicidal thoughts 0 0 0  PHQ-9 Score 1 0 0  Difficult doing work/chores Not difficult at all Not difficult at all Not difficult at all    Social History   Tobacco Use   Smoking status: Never   Smokeless tobacco: Never  Vaping Use   Vaping status: Never Used  Substance Use Topics   Alcohol use: Yes    Comment: wine occ   Drug use: No    Review of Systems Per HPI unless specifically indicated above     Objective:    BP 100/67   Pulse 67   Ht 5\' 9"  (1.753 m)   Wt 226 lb (102.5 kg)   BMI 33.37 kg/m   Wt Readings from Last 3 Encounters:  04/08/23 226 lb (102.5 kg)  01/23/23 226 lb (102.5 kg)  01/09/23 225 lb 12.8 oz (102.4 kg)    Physical Exam Vitals and nursing note reviewed.  Constitutional:      General: He is not in acute distress.    Appearance: Normal appearance. He is well-developed. He is not diaphoretic.     Comments: Well-appearing, comfortable, cooperative  HENT:     Head:  Normocephalic and atraumatic.  Eyes:     General:        Right eye: No discharge.        Left eye: No discharge.     Conjunctiva/sclera: Conjunctivae normal.  Cardiovascular:     Rate and Rhythm: Normal rate.  Pulmonary:     Effort: Pulmonary effort is normal.  Skin:    General: Skin is warm and dry.     Findings: No erythema or rash.  Neurological:     Mental Status: He is alert and oriented to person, place, and time.  Psychiatric:        Mood and Affect: Mood normal.        Behavior: Behavior normal.        Thought Content: Thought content normal.     Comments: Well groomed, good eye contact, normal speech and thoughts      Results for orders placed or performed in visit on 04/01/23  T4, free  Result Value Ref Range   Free T4 0.9 0.8 - 1.8 ng/dL  TSH  Result Value Ref Range   TSH 4.21 0.40 - 4.50 mIU/L  Assessment & Plan:   Problem List Items Addressed This Visit     Hyperlipidemia    Controlled cholesterol on statin  The 10-year ASCVD risk score (Arnett DK, et al., 2019) is: 12%   Plan: 1. Continue current meds - Atorvastatin 20mg  2. Encourage improved lifestyle - low carb/cholesterol, reduce portion size, continue improving regular exercise       Relevant Medications   atorvastatin (LIPITOR) 20 MG tablet   Hypothyroidism (acquired)    Improved Hypothyroidism Symptoms mild improved Resolved TSH and improved T4 Continue low dose Levothyroxine daily      Relevant Medications   levothyroxine (SYNTHROID) 25 MCG tablet     Meds ordered this encounter  Medications   levothyroxine (SYNTHROID) 25 MCG tablet    Sig: Take 1 tablet (25 mcg total) by mouth daily before breakfast.    Dispense:  90 tablet    Refill:  3    Add refills for future   atorvastatin (LIPITOR) 20 MG tablet    Sig: Take 1 tablet (20 mg total) by mouth daily. For cholesterol.    Dispense:  90 tablet    Refill:  3    Add refills for 1 year     Follow up plan: Return  in about 3 months (around 07/09/2023) for 3 month (after Thanksgiving) fasting lab only then 1 week later Yearly Medicare Physical.  Future labs ordered for 07/20/23   Saralyn Pilar, DO [facility] 04/08/2023, 9:05 AM

## 2023-04-08 NOTE — Patient Instructions (Addendum)
Thank you for coming to the office today.  Improved Thyroid result Keep up the great work Continue Low dose Levothyroxine daily  DUE for FASTING BLOOD WORK (no food or drink after midnight before the lab appointment, only water or coffee without cream/sugar on the morning of)  SCHEDULE "Lab Only" visit in the morning at the clinic for lab draw in 3 MONTHS   - Make sure Lab Only appointment is at about 1 week before your next appointment, so that results will be available  For Lab Results, once available within 2-3 days of blood draw, you can can log in to MyChart online to view your results and a brief explanation. Also, we can discuss results at next follow-up visit.   Please schedule a Follow-up Appointment to: Return in about 3 months (around 07/09/2023) for 3 month (after Thanksgiving) fasting lab only then 1 week later Yearly Medicare Physical.  If you have any other questions or concerns, please feel free to call the office or send a message through MyChart. You may also schedule an earlier appointment if necessary.  Additionally, you may be receiving a survey about your experience at our office within a few days to 1 week by e-mail or mail. We value your feedback.  Saralyn Pilar, DO [facility], [place]

## 2023-04-08 NOTE — Assessment & Plan Note (Signed)
Controlled cholesterol on statin  The 10-year ASCVD risk score (Arnett DK, et al., 2019) is: 12%   Plan: 1. Continue current meds - Atorvastatin 20mg  2. Encourage improved lifestyle - low carb/cholesterol, reduce portion size, continue improving regular exercise

## 2023-05-01 ENCOUNTER — Ambulatory Visit (INDEPENDENT_AMBULATORY_CARE_PROVIDER_SITE_OTHER): Payer: Medicare Other

## 2023-05-01 DIAGNOSIS — Z0000 Encounter for general adult medical examination without abnormal findings: Secondary | ICD-10-CM | POA: Diagnosis not present

## 2023-05-01 NOTE — Progress Notes (Signed)
Subjective:   Cody Pearson is a 72 y.o. male who presents for Medicare Annual/Subsequent preventive examination.  Visit Complete: Virtual  I connected with  Cody Pearson on 05/01/23 by a audio enabled telemedicine application and verified that I am speaking with the correct person using two identifiers.  Patient Location: Home  Provider Location: Office/Clinic  I discussed the limitations of evaluation and management by telemedicine. The patient expressed understanding and agreed to proceed.  Vital Signs: Unable to obtain new vitals due to this being a telehealth visit.  Cardiac Risk Factors include: advanced age (>37men, >65 women);dyslipidemia;male gender;obesity (BMI >30kg/m2)     Objective:    There were no vitals filed for this visit. There is no height or weight on file to calculate BMI.     05/01/2023   10:18 AM 10/14/2017    8:12 PM 10/13/2017    6:10 AM 09/30/2017    7:56 AM  Advanced Directives  Does Patient Have a Medical Advance Directive? No No No No  Would patient like information on creating a medical advance directive? No - Patient declined No - Patient declined No - Patient declined No - Patient declined    Current Medications (verified) Outpatient Encounter Medications as of 05/01/2023  Medication Sig   acetaminophen (TYLENOL) 650 MG CR tablet Take 650 mg by mouth every 8 (eight) hours as needed for pain.   AMBULATORY NON FORMULARY MEDICATION Testsosterone 20% (200mg /ml Cream)  Apply 1 ml to upper arms or thights once daily.   atorvastatin (LIPITOR) 20 MG tablet Take 1 tablet (20 mg total) by mouth daily. For cholesterol.   ciclopirox (LOPROX) 0.77 % SUSP Apply 1 application topically at bedtime.   ketoconazole (NIZORAL) 2 % cream Apply 1 Application topically daily.   levothyroxine (SYNTHROID) 25 MCG tablet Take 1 tablet (25 mcg total) by mouth daily before breakfast.   tadalafil (CIALIS) 5 MG tablet Take 1 tablet (5 mg total) by mouth daily.    Testosterone 20.25 MG/ACT (1.62%) GEL 1 pump applied to each shoulder daily   Multiple Vitamins-Minerals (CENTRUM SILVER PO) Take 1 tablet by mouth daily. (Patient not taking: Reported on 05/01/2023)   No facility-administered encounter medications on file as of 05/01/2023.    Allergies (verified) Penicillins and Amoxicillin   History: Past Medical History:  Diagnosis Date   Actinic keratosis 03/28/2020   Arthritis    Cancer (HCC)    melanoma   History of colonic polyps    Hyperlipidemia    Melanoma (HCC) ~2017   scalp, txt[facility]   Sleep apnea    hasn't started using CPAP yet   Past Surgical History:  Procedure Laterality Date   COLONOSCOPY     KNEE ARTHROSCOPY Right    MEDIAL PARTIAL KNEE REPLACEMENT Right    PARTIAL KNEE ARTHROPLASTY Right 10/13/2017   Procedure: UNICOMPARTMENTAL KNEE;  Surgeon: Christena Flake, MD;  Location: [facility] ORS;  Service: Orthopedics;  Laterality: Right;   Family History  Problem Relation Age of Onset   Aneurysm Paternal Grandmother    Colon cancer Neg Hx    Esophageal cancer Neg Hx    Rectal cancer Neg Hx    Stomach cancer Neg Hx    Social History   Socioeconomic History   Marital status: Married    Spouse name: Akintunde Bayley   Number of children: 3   Years of education: Not on file   Highest education level: Not on file  Occupational History   Occupation: Maintenance  Employer: [employer]     Comment: Retired 02/2021   Occupation: Retired    Comment: 03/08/2021  Tobacco Use   Smoking status: Never   Smokeless tobacco: Never  Vaping Use   Vaping status: Never Used  Substance and Sexual Activity   Alcohol use: Yes    Comment: wine occ   Drug use: No   Sexual activity: Not on file  Other Topics Concern   Not on file  Social History Narrative   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   Highest level of education 12th grade.   Married.   Lives locally.   Social Determinants of Health   Financial Resource  Strain: Low Risk  (05/01/2023)   Overall Financial Resource Strain (CARDIA)    Difficulty of Paying Living Expenses: Not hard at all  Food Insecurity: No Food Insecurity (05/01/2023)   Hunger Vital Sign    Worried About Running Out of Food in the Last Year: Never true    Ran Out of Food in the Last Year: Never true  Transportation Needs: No Transportation Needs (05/01/2023)   PRAPARE - Administrator, Civil Service (Medical): No    Lack of Transportation (Non-Medical): No  Physical Activity: Sufficiently Active (05/01/2023)   Exercise Vital Sign    Days of Exercise per Week: 4 days    Minutes of Exercise per Session: 60 min  Stress: No Stress Concern Present (05/01/2023)   [employer] of Occupational Health - Occupational Stress Questionnaire    Feeling of Stress : Not at all  Social Connections: Moderately Isolated (05/01/2023)   Social Connection and Isolation Panel [NHANES]    Frequency of Communication with Friends and Family: More than three times a week    Frequency of Social Gatherings with Friends and Family: Once a week    Attends Religious Services: Never    Database administrator or Organizations: No    Attends Engineer, structural: Never    Marital Status: Married    Tobacco Counseling Counseling given: Not Answered   Clinical Intake:  Pre-visit preparation completed: Yes  Pain : No/denies pain     Nutritional Status: BMI > 30  Obese Nutritional Risks: None Diabetes: No  How often do you need to have someone help you when you read instructions, pamphlets, or other written materials from your doctor or pharmacy?: 1 - Never  Interpreter Needed?: No  Information entered by :: Kennedy Bucker, LPN   Activities of Daily Living    05/01/2023   10:19 AM 04/08/2023    9:40 AM  In your present state of health, do you have any difficulty performing the following activities:  Hearing? 0 0  Vision? 0 0  Difficulty concentrating or making  decisions? 0 0  Walking or climbing stairs? 0 0  Dressing or bathing? 0 0  Doing errands, shopping? 0 0  Preparing Food and eating ? N   Using the Toilet? N   In the past six months, have you accidently leaked urine? N   Do you have problems with loss of bowel control? N   Managing your Medications? N   Managing your Finances? N   Housekeeping or managing your Housekeeping? N     Patient Care Team: Smitty Cords, DO as PCP - General (Family Medicine)  Indicate any recent Medical Services you may have received from other than [facility] providers in the past year (date may be approximate).     Assessment:  This is a routine wellness examination for [place].  Hearing/Vision screen Hearing Screening - Comments:: No aids Vision Screening - Comments:: Wears glasses- [facility]   Goals Addressed             This Visit's Progress    DIET - EAT MORE FRUITS AND VEGETABLES         Depression Screen    05/01/2023   10:17 AM 04/08/2023    9:40 AM 01/23/2023    9:40 AM 07/17/2022   10:14 AM 04/28/2022    1:31 PM 04/28/2022    1:30 PM 11/15/2021   10:15 AM  PHQ 2/9 Scores  PHQ - 2 Score 0 0 0 0 0 0 0  PHQ- 9 Score 0  1 0 0  0    Fall Risk    05/01/2023   10:19 AM 04/08/2023    9:40 AM 01/23/2023    9:40 AM 07/17/2022   10:14 AM 04/28/2022    1:31 PM  Fall Risk   Falls in the past year? 0 0 0 0 0  Number falls in past yr: 0  0 0 0  Injury with Fall? 0  0 0 0  Risk for fall due to : No Fall Risks  No Fall Risks No Fall Risks History of fall(s)  Follow up Falls prevention discussed;Falls evaluation completed  Falls evaluation completed Falls evaluation completed Falls evaluation completed    MEDICARE RISK AT HOME: Medicare Risk at Home Any stairs in or around the home?: Yes If so, are there any without handrails?: Yes Home free of loose throw rugs in walkways, pet beds, electrical cords, etc?: Yes Adequate lighting in your home to reduce risk of falls?: Yes Life  alert?: No Use of a cane, walker or w/c?: No Grab bars in the bathroom?: No Shower chair or bench in shower?: No Elevated toilet seat or a handicapped toilet?: No  TIMED UP AND GO:  Was the test performed?  No    Cognitive Function:        05/01/2023   10:20 AM 04/28/2022    1:39 PM  6CIT Screen  What Year? 0 points 0 points  What month? 0 points 0 points  What time? 0 points 0 points  Count back from 20 0 points 0 points  Months in reverse 0 points 0 points  Repeat phrase 4 points 10 points  Total Score 4 points 10 points    Immunizations Immunization History  Administered Date(s) Administered   Fluad Quad(high Dose 65+) 05/25/2020   Influenza,inj,Quad PF,6+ Mos 05/12/2017   Influenza-Unspecified 06/06/2022   Moderna Sars-Covid-2 Vaccination 09/19/2019, 10/17/2019, 06/22/2020   Pneumococcal Conjugate-13 04/04/2019   Pneumococcal Polysaccharide-23 04/20/2020   Tdap 12/12/2010    TDAP status: Due, Education has been provided regarding the importance of this vaccine. Advised may receive this vaccine at local pharmacy or Health Dept. Aware to provide a copy of the vaccination record if obtained from local pharmacy or Health Dept. Verbalized acceptance and understanding.  Flu Vaccine status: Up to date  Pneumococcal vaccine status: Up to date  Covid-19 vaccine status: Completed vaccines  Qualifies for Shingles Vaccine? Yes   Zostavax completed No   Shingrix Completed?: No.    Education has been provided regarding the importance of this vaccine. Patient has been advised to call insurance company to determine out of pocket expense if they have not yet received this vaccine. Advised may also receive vaccine at local pharmacy or Health Dept. Verbalized acceptance and understanding.  Screening Tests Health Maintenance  Topic Date Due   INFLUENZA VACCINE  03/12/2023   Zoster Vaccines- Shingrix (1 of 2) 07/09/2023 (Originally 07/24/1970)   Medicare Annual Wellness (AWV)   04/30/2024   Colonoscopy  03/27/2028   Pneumonia Vaccine 23+ Years old  Completed   Hepatitis C Screening  Completed   HPV VACCINES  Aged Out   DTaP/Tdap/Td  Discontinued   COVID-19 Vaccine  Discontinued    Health Maintenance  Health Maintenance Due  Topic Date Due   INFLUENZA VACCINE  03/12/2023    Colorectal cancer screening: Type of screening: Colonoscopy. Completed 03/27/21. Repeat every 7 years  Lung Cancer Screening: (Low Dose CT Chest recommended if Age 34-80 years, 20 pack-year currently smoking OR have quit w/in 15years.) does not qualify.   Additional Screening:  Hepatitis C Screening: does qualify; Completed 03/26/18  Vision Screening: Recommended annual ophthalmology exams for early detection of glaucoma and other disorders of the eye. Is the patient up to date with their annual eye exam?  Yes  Who is the provider or what is the name of the office in which the patient attends annual eye exams? [facility]  If pt is not established with a provider, would they like to be referred to a provider to establish care? No .   Dental Screening: Recommended annual dental exams for proper oral hygiene   Community Resource Referral / Chronic Care Management: CRR required this visit?  No   CCM required this visit?  No     Plan:     I have personally reviewed and noted the following in the patient's chart:   Medical and social history Use of alcohol, tobacco or illicit drugs  Current medications and supplements including opioid prescriptions. Patient is not currently taking opioid prescriptions. Functional ability and status Nutritional status Physical activity Advanced directives List of other physicians Hospitalizations, surgeries, and ER visits in previous 12 months Vitals Screenings to include cognitive, depression, and falls Referrals and appointments  In addition, I have reviewed and discussed with patient certain preventive protocols, quality metrics, and  best practice recommendations. A written personalized care plan for preventive services as well as general preventive health recommendations were provided to patient.     Hal Hope, LPN   8/41/3244   After Visit Summary: (MyChart) Due to this being a telephonic visit, the after visit summary with patients personalized plan was offered to patient via MyChart   Nurse Notes: none

## 2023-05-01 NOTE — Patient Instructions (Addendum)
Cody Pearson , Thank you for taking time to come for your Medicare Wellness Visit. I appreciate your ongoing commitment to your health goals. Please review the following plan we discussed and let me know if I can assist you in the future.   Referrals/Orders/Follow-Ups/Clinician Recommendations: none  This is a list of the screening recommended for you and due dates:  Health Maintenance  Topic Date Due   Flu Shot  03/12/2023   Zoster (Shingles) Vaccine (1 of 2) 07/09/2023*   Medicare Annual Wellness Visit  04/30/2024   Colon Cancer Screening  03/27/2028   Pneumonia Vaccine  Completed   Hepatitis C Screening  Completed   HPV Vaccine  Aged Out   DTaP/Tdap/Td vaccine  Discontinued   COVID-19 Vaccine  Discontinued  *Topic was postponed. The date shown is not the original due date.    Advanced directives: (ACP Link)Information on Advanced Care Planning can be found at [facility] Directives Advance Health Care Directives ([email or web address]   Next Medicare Annual Wellness Visit scheduled for next year: Yes   05/06/24 @ 10:00 am by video

## 2023-05-12 ENCOUNTER — Encounter: Payer: Self-pay | Admitting: Urology

## 2023-05-13 ENCOUNTER — Other Ambulatory Visit: Payer: Self-pay | Admitting: *Deleted

## 2023-05-13 MED ORDER — TESTOSTERONE 20.25 MG/ACT (1.62%) TD GEL: TRANSDERMAL | 2 refills | Status: DC

## 2023-05-29 ENCOUNTER — Other Ambulatory Visit: Payer: Self-pay | Admitting: *Deleted

## 2023-05-29 DIAGNOSIS — N401 Enlarged prostate with lower urinary tract symptoms: Secondary | ICD-10-CM

## 2023-05-29 DIAGNOSIS — E291 Testicular hypofunction: Secondary | ICD-10-CM

## 2023-05-29 DIAGNOSIS — N5201 Erectile dysfunction due to arterial insufficiency: Secondary | ICD-10-CM

## 2023-06-01 ENCOUNTER — Other Ambulatory Visit: Payer: Medicare Other

## 2023-06-01 DIAGNOSIS — N5201 Erectile dysfunction due to arterial insufficiency: Secondary | ICD-10-CM | POA: Diagnosis not present

## 2023-06-01 DIAGNOSIS — E291 Testicular hypofunction: Secondary | ICD-10-CM

## 2023-06-01 DIAGNOSIS — N401 Enlarged prostate with lower urinary tract symptoms: Secondary | ICD-10-CM | POA: Diagnosis not present

## 2023-06-01 DIAGNOSIS — R3912 Poor urinary stream: Secondary | ICD-10-CM | POA: Diagnosis not present

## 2023-06-02 LAB — HEMOGLOBIN AND HEMATOCRIT, BLOOD
Hematocrit: 49.7 % (ref 37.5–51.0)
Hemoglobin: 16 g/dL (ref 13.0–17.7)

## 2023-06-02 LAB — PSA: Prostate Specific Ag, Serum: 0.9 ng/mL (ref 0.0–4.0)

## 2023-06-02 LAB — TESTOSTERONE: Testosterone: 237 ng/dL — ABNORMAL LOW (ref 264–916)

## 2023-06-03 ENCOUNTER — Encounter: Payer: Self-pay | Admitting: Urology

## 2023-06-03 ENCOUNTER — Ambulatory Visit: Payer: Medicare Other | Admitting: Urology

## 2023-06-03 VITALS — BP 124/77 | HR 86 | Ht 69.0 in | Wt 220.0 lb

## 2023-06-03 DIAGNOSIS — E291 Testicular hypofunction: Secondary | ICD-10-CM

## 2023-06-03 DIAGNOSIS — N529 Male erectile dysfunction, unspecified: Secondary | ICD-10-CM

## 2023-06-03 DIAGNOSIS — N401 Enlarged prostate with lower urinary tract symptoms: Secondary | ICD-10-CM

## 2023-06-03 NOTE — Progress Notes (Signed)
I, Cody Pearson, acting as a scribe for Riki Altes, MD., have documented all relevant documentation on the behalf of Riki Altes, MD, as directed by Riki Altes, MD while in the presence of Riki Altes, MD.  06/03/2023 1:06 PM   Cody Pearson 01-10-1951 [id number]  Referring provider: Smitty Cords, DO [address] [place],  [place] [address]  Chief Complaint  Patient presents with   Hypogonadism   Urologic history 1. Hypogonadism Symptoms inlcude tiredness, fatigue, and erectile dysfunction.  Topical testosterone.   2. Erectile dysfunction Risk factors hypogonadism Tadalafil 5 mg daily  3. BPH with LUTS Moderate lower urinary tract symptoms Tadalafil 5 mg daily.  HPI: Cody Pearson is a 72 y.o. male presents for a follow up visit.   He was using a compounded testosterone cream, however was able to obtain generic AndroGel through his local pharmacy at a significantly lower cost and presently on testosterone gel 1.60% to each shoulder daily.  He states he has only been using the testosterone gel sporadically, primarily prior to intercourse. Labs 06/01/23 testosterone low at 237, PSA stable 0.9, H/H 16.0 49.7   PMH: Past Medical History:  Diagnosis Date   Actinic keratosis 03/28/2020   Arthritis    Cancer (HCC)    melanoma   History of colonic polyps    Hyperlipidemia    Melanoma (HCC) ~2017   scalp, txted at [facility]   Sleep apnea    hasn't started using CPAP yet    Surgical History: Past Surgical History:  Procedure Laterality Date   COLONOSCOPY     KNEE ARTHROSCOPY Right    MEDIAL PARTIAL KNEE REPLACEMENT Right    PARTIAL KNEE ARTHROPLASTY Right 10/13/2017   Procedure: UNICOMPARTMENTAL KNEE;  Surgeon: Christena Flake, MD;  Location: [facility] ORS;  Service: Orthopedics;  Laterality: Right;    Home Medications:  Allergies as of 06/03/2023       Reactions   Penicillins    nausea   Amoxicillin Nausea Only   Nausea,  anorexia, body aches        Medication List        Accurate as of June 03, 2023  1:06 PM. If you have any questions, ask your nurse or doctor.          acetaminophen 650 MG CR tablet Commonly known as: TYLENOL Take 650 mg by mouth every 8 (eight) hours as needed for pain.   AMBULATORY NON FORMULARY MEDICATION Testsosterone 20% (200mg /ml Cream)  Apply 1 ml to upper arms or thights once daily.   atorvastatin 20 MG tablet Commonly known as: LIPITOR Take 1 tablet (20 mg total) by mouth daily. For cholesterol.   CENTRUM SILVER PO Take 1 tablet by mouth daily.   ciclopirox 0.77 % Susp Commonly known as: LOPROX Apply 1 application topically at bedtime.   ketoconazole 2 % cream Commonly known as: NIZORAL Apply 1 Application topically daily.   levothyroxine 25 MCG tablet Commonly known as: SYNTHROID Take 1 tablet (25 mcg total) by mouth daily before breakfast.   tadalafil 5 MG tablet Commonly known as: CIALIS Take 1 tablet (5 mg total) by mouth daily.   Testosterone 20.25 MG/ACT (1.62%) Gel 1 pump applied to each shoulder daily        Allergies:  Allergies  Allergen Reactions   Penicillins     nausea   Amoxicillin Nausea Only    Nausea, anorexia, body aches    Family History: Family  History  Problem Relation Age of Onset   Aneurysm Paternal Grandmother    Colon cancer Neg Hx    Esophageal cancer Neg Hx    Rectal cancer Neg Hx    Stomach cancer Neg Hx     Social History:  reports that he has never smoked. He has never used smokeless tobacco. He reports current alcohol use. He reports that he does not use drugs.   Physical Exam: BP 124/77   Pulse 86   Ht 5\' 9"  (1.753 m)   Wt 220 lb (99.8 kg)   BMI 32.49 kg/m   Constitutional:  Alert and oriented, No acute distress. HEENT: [place] AT Respiratory: Normal respiratory effort, no increased work of breathing. Psychiatric: Normal mood and affect.   Assessment & Plan:    1.  Hypogonadism Testosterone level was low at 237, however he is only using sporadically.  Recommend he use daily and not PRN. Discussed this would be more effective for his ED than taking PRN.  Lab visit 6 months testosterone, H/H.  1 year office visit with testosterone, PSA, H/H.   2. BPH with LUTS Stable on tadalafil  3. Erectile dysfunction Stable on tadalafil  I have reviewed the above documentation for accuracy and completeness, and I agree with the above.   Riki Altes, MD  [facility] [address], [address] [place], [place] [address] [phone]

## 2023-07-20 ENCOUNTER — Other Ambulatory Visit: Payer: Medicare Other

## 2023-07-20 DIAGNOSIS — R7309 Other abnormal glucose: Secondary | ICD-10-CM | POA: Diagnosis not present

## 2023-07-20 DIAGNOSIS — E782 Mixed hyperlipidemia: Secondary | ICD-10-CM

## 2023-07-20 DIAGNOSIS — E039 Hypothyroidism, unspecified: Secondary | ICD-10-CM | POA: Diagnosis not present

## 2023-07-20 DIAGNOSIS — G4733 Obstructive sleep apnea (adult) (pediatric): Secondary | ICD-10-CM

## 2023-07-21 LAB — CBC WITH DIFFERENTIAL/PLATELET
Absolute Lymphocytes: 1470 {cells}/uL (ref 850–3900)
Absolute Monocytes: 677 {cells}/uL (ref 200–950)
Basophils Absolute: 43 {cells}/uL (ref 0–200)
Basophils Relative: 0.7 %
Eosinophils Absolute: 238 {cells}/uL (ref 15–500)
Eosinophils Relative: 3.9 %
HCT: 50.5 % — ABNORMAL HIGH (ref 38.5–50.0)
Hemoglobin: 16.6 g/dL (ref 13.2–17.1)
MCH: 30.6 pg (ref 27.0–33.0)
MCHC: 32.9 g/dL (ref 32.0–36.0)
MCV: 93 fL (ref 80.0–100.0)
MPV: 11 fL (ref 7.5–12.5)
Monocytes Relative: 11.1 %
Neutro Abs: 3672 {cells}/uL (ref 1500–7800)
Neutrophils Relative %: 60.2 %
Platelets: 213 10*3/uL (ref 140–400)
RBC: 5.43 10*6/uL (ref 4.20–5.80)
RDW: 12.8 % (ref 11.0–15.0)
Total Lymphocyte: 24.1 %
WBC: 6.1 10*3/uL (ref 3.8–10.8)

## 2023-07-21 LAB — COMPLETE METABOLIC PANEL WITH GFR
AG Ratio: 2 (calc) (ref 1.0–2.5)
ALT: 29 U/L (ref 9–46)
AST: 20 U/L (ref 10–35)
Albumin: 4.5 g/dL (ref 3.6–5.1)
Alkaline phosphatase (APISO): 67 U/L (ref 35–144)
BUN/Creatinine Ratio: 12 (calc) (ref 6–22)
BUN: 15 mg/dL (ref 7–25)
CO2: 28 mmol/L (ref 20–32)
Calcium: 9.3 mg/dL (ref 8.6–10.3)
Chloride: 105 mmol/L (ref 98–110)
Creat: 1.29 mg/dL — ABNORMAL HIGH (ref 0.70–1.28)
Globulin: 2.2 g/dL (ref 1.9–3.7)
Glucose, Bld: 100 mg/dL — ABNORMAL HIGH (ref 65–99)
Potassium: 4.5 mmol/L (ref 3.5–5.3)
Sodium: 142 mmol/L (ref 135–146)
Total Bilirubin: 0.6 mg/dL (ref 0.2–1.2)
Total Protein: 6.7 g/dL (ref 6.1–8.1)
eGFR: 60 mL/min/{1.73_m2} (ref >=60)

## 2023-07-21 LAB — TSH: TSH: 2.78 m[IU]/L (ref 0.40–4.50)

## 2023-07-21 LAB — LIPID PANEL
Cholesterol: 143 mg/dL (ref <200)
HDL: 32 mg/dL — ABNORMAL LOW (ref >=40)
LDL Cholesterol (Calc): 85 mg/dL
Non-HDL Cholesterol (Calc): 111 mg/dL (ref <130)
Total CHOL/HDL Ratio: 4.5 (calc) (ref <5.0)
Triglycerides: 154 mg/dL — ABNORMAL HIGH (ref <150)

## 2023-07-21 LAB — HEMOGLOBIN A1C
Hgb A1c MFr Bld: 6 %{Hb} — ABNORMAL HIGH (ref <5.7)
Mean Plasma Glucose: 126 mg/dL
eAG (mmol/L): 7 mmol/L

## 2023-07-21 LAB — T4, FREE: Free T4: 0.9 ng/dL (ref 0.8–1.8)

## 2023-07-27 ENCOUNTER — Encounter: Payer: Medicare Other | Admitting: Family Medicine

## 2023-07-28 ENCOUNTER — Ambulatory Visit (INDEPENDENT_AMBULATORY_CARE_PROVIDER_SITE_OTHER): Payer: Medicare Other | Admitting: Family Medicine

## 2023-07-28 ENCOUNTER — Other Ambulatory Visit: Payer: Self-pay | Admitting: Family Medicine

## 2023-07-28 ENCOUNTER — Encounter: Payer: Self-pay | Admitting: Family Medicine

## 2023-07-28 VITALS — BP 110/68 | HR 68 | Ht 69.0 in | Wt 228.0 lb

## 2023-07-28 DIAGNOSIS — E039 Hypothyroidism, unspecified: Secondary | ICD-10-CM | POA: Diagnosis not present

## 2023-07-28 DIAGNOSIS — G4733 Obstructive sleep apnea (adult) (pediatric): Secondary | ICD-10-CM | POA: Diagnosis not present

## 2023-07-28 DIAGNOSIS — R3912 Poor urinary stream: Secondary | ICD-10-CM

## 2023-07-28 DIAGNOSIS — R7309 Other abnormal glucose: Secondary | ICD-10-CM

## 2023-07-28 DIAGNOSIS — E782 Mixed hyperlipidemia: Secondary | ICD-10-CM | POA: Diagnosis not present

## 2023-07-28 DIAGNOSIS — N401 Enlarged prostate with lower urinary tract symptoms: Secondary | ICD-10-CM

## 2023-07-28 NOTE — Patient Instructions (Addendum)
Thank you for coming to the office today.  -------------------------  Continue improving diet and low sugar carb starches   Cholesterol is well controlled. Keep on medication  ------------------------------------  No change on thyroid  -----------------------------------  If interested - we can refer for this imaging test.  Coronary Calcium Score Cardiac CT Scan. This is a screening test for patients aged 71-50+ with cardiovascular risk factors or who are healthy but would be interested in Cardiovascular Screening for heart disease. Even if there is a family history of heart disease, this imaging can be useful. Typically it can be done every 5+ years or at a different timeline we agree on  The scan will look at the chest and mainly focus on the heart and identify early signs of calcium build up or blockages within the heart arteries. It is not 100% accurate for identifying blockages or heart disease, but it is useful to help [place] predict who may have some early changes or be at risk in the future for a heart attack or cardiovascular problem.  The results are reviewed by a Cardiologist and they will document the results. It should become available on MyChart. Typically the results are divided into percentiles based on other patients of the same demographic and age. So it will compare your risk to others similar to you. If you have a higher score >99 or higher percentile >75%tile, it is recommended to consider Statin cholesterol therapy and or referral to Cardiologist. I will try to help explain your results and if we have questions we can contact the Cardiologist.  You will be contacted for scheduling. Usually it is done at any imaging facility through [facility], [facility] or [facility].  The cost is $99 flat fee total and it does not go through insurance, so no authorization is required.  DUE for FASTING BLOOD WORK (no food or drink after midnight before  the lab appointment, only water or coffee without cream/sugar on the morning of)  SCHEDULE "Lab Only" visit in the morning at the clinic for lab draw in 6 MONTHS   - Make sure Lab Only appointment is at about 1 week before your next appointment, so that results will be available  For Lab Results, once available within 2-3 days of blood draw, you can can log in to MyChart online to view your results and a brief explanation. Also, we can discuss results at next follow-up visit.   Please schedule a Follow-up Appointment to: Return in about 6 months (around 01/26/2024) for 6 month fasting lab > 1 week later Follow-up PreDM Thyroid lab results.  If you have any other questions or concerns, please feel free to call the office or send a message through MyChart. You may also schedule an earlier appointment if necessary.  Additionally, you may be receiving a survey about your experience at our office within a few days to 1 week by e-mail or mail. We value your feedback.  Saralyn Pilar, DO [facility], [place]

## 2023-07-28 NOTE — Progress Notes (Signed)
Subjective:    Patient ID: Cody Pearson, male    DOB: [birth year], 72 y.o.   MRN: [id number]  Cody Pearson is a 72 y.o. male presenting on 07/28/2023 for Follow-up (Thyroid disease )   HPI  Discussed the use of AI scribe software for clinical note transcription with the patient, who gave verbal consent to proceed.       Hypothyroidism Controlled on Levothyroxine daily Last labs TSH and T4 normal   OSA on CPAP - Patient reports prior history of dx OSA and on CPAP, prior to treatment initial symptoms were snoring, daytime sleepiness and fatigue, has had several sleep studies in the past. Most recent followed by [facility] - with sleep management  - Today reports that sleep apnea is well controlled. He uses the CPAP machine every night. Tolerates the machine well, and thinks that sleeps better with it and feels good. No new concerns or symptoms.   BPH ED Hypogonadism Previously Followed by Dr Anola Gurney [facility] Now followed by [facility] Continues to take Testosterone cream topical On Tadalafil 5mg  daily for both BPH and for ED   S/p Partial Knee Replacement R Knee Takes antibiotic prophylaxis Clindamycin (prior had allergic reaction and nausea vomiting on Amoxicillin) prior to dentist   Elevated A1c Lab result 6.0, prior range 5.8 He admits eating more sweets and desserts.   HYPERLIPIDEMIA: - Reports no concerns. Last lipid panel 07/2023, controlled on statin LDL down to 85 He does have elevated TG 154. - Currently taking Atorvastatin 20mg , tolerating well without side effects or myalgias      Health Maintenance:  Flu Shot and COVID booster at pharmacy   Colonoscopy completed 03/2021, next repeat 7 years.   PSA 0.73 negative.  Decline Shingles vaccine       07/28/2023    2:13 PM 05/01/2023   10:17 AM 04/08/2023    9:40 AM  Depression screen PHQ 2/9  Decreased Interest 0 0 0  Down, Depressed, Hopeless 0 0 0  PHQ - 2 Score  0 0 0  Altered sleeping  0   Tired, decreased energy  0   Change in appetite  0   Feeling bad or failure about yourself   0   Trouble concentrating  0   Moving slowly or fidgety/restless  0   Suicidal thoughts  0   PHQ-9 Score  0   Difficult doing work/chores  Not difficult at all        07/28/2023    2:13 PM 04/08/2023    9:40 AM 01/23/2023    9:40 AM 07/17/2022   10:14 AM  GAD 7 : Generalized Anxiety Score  Nervous, Anxious, on Edge 0 0 0 0  Control/stop worrying 0 0 0 0  Worry too much - different things 0 0 0 0  Trouble relaxing 0 0 0 0  Restless 0 0 0 0  Easily annoyed or irritable 0 0 0 0  Afraid - awful might happen 0 0 0 0  Total GAD 7 Score 0 0 0 0  Anxiety Difficulty   Not difficult at all Not difficult at all     Past Medical History:  Diagnosis Date   Actinic keratosis 03/28/2020   Arthritis    Cancer (HCC)    melanoma   History of colonic polyps    Hyperlipidemia    Melanoma (HCC) ~2017   scalp, txted at [facility]   Sleep apnea    hasn't started using  CPAP yet   Past Surgical History:  Procedure Laterality Date   COLONOSCOPY     KNEE ARTHROSCOPY Right    MEDIAL PARTIAL KNEE REPLACEMENT Right    PARTIAL KNEE ARTHROPLASTY Right 10/13/2017   Procedure: UNICOMPARTMENTAL KNEE;  Surgeon: Christena Flake, MD;  Location: [facility] ORS;  Service: Orthopedics;  Laterality: Right;   Social History   Socioeconomic History   Marital status: Married    Spouse name: Cody Pearson   Number of children: 3   Years of education: Not on file   Highest education level: Not on file  Occupational History   Occupation: Maintenance    Employer: [employer]     Comment: Retired 02/2021   Occupation: Retired    Comment: 03/08/2021  Tobacco Use   Smoking status: Never   Smokeless tobacco: Never  Vaping Use   Vaping status: Never Used  Substance and Sexual Activity   Alcohol use: Yes    Comment: wine occ   Drug use: No   Sexual activity: Not on file  Other Topics Concern    Not on file  Social History Narrative   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   Highest level of education 12th grade.   Married.   Lives locally.   Social Drivers of Corporate investment banker Strain: Low Risk  (05/01/2023)   Overall Financial Resource Strain (CARDIA)    Difficulty of Paying Living Expenses: Not hard at all  Food Insecurity: No Food Insecurity (05/01/2023)   Hunger Vital Sign    Worried About Running Out of Food in the Last Year: Never true    Ran Out of Food in the Last Year: Never true  Transportation Needs: No Transportation Needs (05/01/2023)   PRAPARE - Administrator, Civil Service (Medical): No    Lack of Transportation (Non-Medical): No  Physical Activity: Sufficiently Active (05/01/2023)   Exercise Vital Sign    Days of Exercise per Week: 4 days    Minutes of Exercise per Session: 60 min  Stress: No Stress Concern Present (05/01/2023)   [employer] of Occupational Health - Occupational Stress Questionnaire    Feeling of Stress : Not at all  Social Connections: Moderately Isolated (05/01/2023)   Social Connection and Isolation Panel [NHANES]    Frequency of Communication with Friends and Family: More than three times a week    Frequency of Social Gatherings with Friends and Family: Once a week    Attends Religious Services: Never    Database administrator or Organizations: No    Attends Banker Meetings: Never    Marital Status: Married  Catering manager Violence: Not At Risk (05/01/2023)   Humiliation, Afraid, Rape, and Kick questionnaire    Fear of Current or Ex-Partner: No    Emotionally Abused: No    Physically Abused: No    Sexually Abused: No   Family History  Problem Relation Age of Onset   Aneurysm Paternal Grandmother    Colon cancer Neg Hx    Esophageal cancer Neg Hx    Rectal cancer Neg Hx    Stomach cancer Neg Hx    Current Outpatient Medications on File Prior to Visit  Medication Sig    acetaminophen (TYLENOL) 650 MG CR tablet Take 650 mg by mouth every 8 (eight) hours as needed for pain.   AMBULATORY NON FORMULARY MEDICATION Testsosterone 20% (200mg /ml Cream)  Apply 1 ml to upper arms or thights once daily.  atorvastatin (LIPITOR) 20 MG tablet Take 1 tablet (20 mg total) by mouth daily. For cholesterol.   ciclopirox (LOPROX) 0.77 % SUSP Apply 1 application topically at bedtime.   ketoconazole (NIZORAL) 2 % cream Apply 1 Application topically daily.   levothyroxine (SYNTHROID) 25 MCG tablet Take 1 tablet (25 mcg total) by mouth daily before breakfast.   Multiple Vitamins-Minerals (CENTRUM SILVER PO) Take 1 tablet by mouth daily.   tadalafil (CIALIS) 5 MG tablet Take 1 tablet (5 mg total) by mouth daily.   Testosterone 20.25 MG/ACT (1.62%) GEL 1 pump applied to each shoulder daily   No current facility-administered medications on file prior to visit.    Review of Systems Per HPI unless specifically indicated above     Objective:    BP 110/68   Pulse 68   Ht 5\' 9"  (1.753 m)   Wt 228 lb (103.4 kg)   BMI 33.67 kg/m   Wt Readings from Last 3 Encounters:  07/28/23 228 lb (103.4 kg)  06/03/23 220 lb (99.8 kg)  04/08/23 226 lb (102.5 kg)    Physical Exam Vitals and nursing note reviewed.  Constitutional:      General: He is not in acute distress.    Appearance: He is well-developed. He is not diaphoretic.     Comments: Well-appearing, comfortable, cooperative  HENT:     Head: Normocephalic and atraumatic.  Eyes:     General:        Right eye: No discharge.        Left eye: No discharge.     Conjunctiva/sclera: Conjunctivae normal.  Neck:     Thyroid: No thyromegaly.  Cardiovascular:     Rate and Rhythm: Normal rate and regular rhythm.     Pulses: Normal pulses.     Heart sounds: Normal heart sounds. No murmur heard. Pulmonary:     Effort: Pulmonary effort is normal. No respiratory distress.     Breath sounds: Normal breath sounds. No wheezing or  rales.  Musculoskeletal:        General: Normal range of motion.     Cervical back: Normal range of motion and neck supple.  Lymphadenopathy:     Cervical: No cervical adenopathy.  Skin:    General: Skin is warm and dry.     Findings: No erythema or rash.  Neurological:     Mental Status: He is alert and oriented to person, place, and time. Mental status is at baseline.  Psychiatric:        Behavior: Behavior normal.     Comments: Well groomed, good eye contact, normal speech and thoughts     Results for orders placed or performed in visit on 07/20/23  T4, free   Collection Time: 07/20/23  8:41 AM  Result Value Ref Range   Free T4 0.9 0.8 - 1.8 ng/dL  TSH   Collection Time: 07/20/23  8:41 AM  Result Value Ref Range   TSH 2.78 0.40 - 4.50 mIU/L  CBC with Differential/Platelet   Collection Time: 07/20/23  8:41 AM  Result Value Ref Range   WBC 6.1 3.8 - 10.8 Thousand/uL   RBC 5.43 4.20 - 5.80 Million/uL   Hemoglobin 16.6 13.2 - 17.1 g/dL   HCT 78.2 (H) 95.6 - 21.3 %   MCV 93.0 80.0 - 100.0 fL   MCH 30.6 27.0 - 33.0 pg   MCHC 32.9 32.0 - 36.0 g/dL   RDW 08.6 57.8 - 46.9 %   Platelets 213 140 - 400 Thousand/uL  MPV 11.0 7.5 - 12.5 fL   Neutro Abs 3,672 1,500 - 7,800 cells/uL   Absolute Lymphocytes 1,470 850 - 3,900 cells/uL   Absolute Monocytes 677 200 - 950 cells/uL   Eosinophils Absolute 238 15 - 500 cells/uL   Basophils Absolute 43 0 - 200 cells/uL   Neutrophils Relative % 60.2 %   Total Lymphocyte 24.1 %   Monocytes Relative 11.1 %   Eosinophils Relative 3.9 %   Basophils Relative 0.7 %  Lipid panel   Collection Time: 07/20/23  8:41 AM  Result Value Ref Range   Cholesterol 143 <200 mg/dL   HDL 32 (L) > OR = 40 mg/dL   Triglycerides 914 (H) <150 mg/dL   LDL Cholesterol (Calc) 85 mg/dL (calc)   Total CHOL/HDL Ratio 4.5 <5.0 (calc)   Non-HDL Cholesterol (Calc) 111 <130 mg/dL (calc)  COMPLETE METABOLIC PANEL WITH GFR   Collection Time: 07/20/23  8:41 AM  Result  Value Ref Range   Glucose, Bld 100 (H) 65 - 99 mg/dL   BUN 15 7 - 25 mg/dL   Creat 7.82 (H) 9.56 - 1.28 mg/dL   eGFR 60 > OR = 60 OZ/HYQ/6.57Q4   BUN/Creatinine Ratio 12 6 - 22 (calc)   Sodium 142 135 - 146 mmol/L   Potassium 4.5 3.5 - 5.3 mmol/L   Chloride 105 98 - 110 mmol/L   CO2 28 20 - 32 mmol/L   Calcium 9.3 8.6 - 10.3 mg/dL   Total Protein 6.7 6.1 - 8.1 g/dL   Albumin 4.5 3.6 - 5.1 g/dL   Globulin 2.2 1.9 - 3.7 g/dL (calc)   AG Ratio 2.0 1.0 - 2.5 (calc)   Total Bilirubin 0.6 0.2 - 1.2 mg/dL   Alkaline phosphatase (APISO) 67 35 - 144 U/L   AST 20 10 - 35 U/L   ALT 29 9 - 46 U/L  Hemoglobin A1c   Collection Time: 07/20/23  8:41 AM  Result Value Ref Range   Hgb A1c MFr Bld 6.0 (H) <5.7 % of total Hgb   Mean Plasma Glucose 126 mg/dL   eAG (mmol/L) 7.0 mmol/L      Assessment & Plan:   Problem List Items Addressed This Visit     Benign prostatic hyperplasia with weak urinary stream   Elevated hemoglobin A1c - Primary   Hyperlipidemia   Hypothyroidism (acquired)   OSA on CPAP     Updated Health Maintenance information Reviewed recent lab results with patient Encouraged improvement to lifestyle with diet and exercise Goal of weight loss    Elevated A1c Pre DM A1c increased from 5.8 to 6.0, likely due to increased intake of sweets and carbohydrates. No medication changes needed at this time. -Continue current diet modifications and reduce intake of sweets and carbohydrates. -Check A1c in 6 months.  Hyperlipidemia LDL cholesterol improved from 92 to 85 with current medication regimen. -Continue current medication regimen. -Check cholesterol in 6 months.  Hypothyroidism Thyroid function stable on current medication regimen. Continue Levothyroxine daily -Check thyroid function in 6 months.  BPH On meds Follow urology  Allergic Conjunctivitis Reports itchy, watery eyes. Currently using Pataday with some relief. -Continue Pataday as needed for symptom  relief.  General Health Maintenance / Followup Plans -Flu vaccine and COVID-19 vaccine completed at [facility] in September 2024. -Colon cancer screening completed in 2022, next due in 2029. -Consider coronary artery calcium scan for heart disease risk assessment in the future. -Next appointment in 6 months (June 2025) for follow-up and labs.  No orders of the defined types were placed in this encounter.   No orders of the defined types were placed in this encounter.    Follow up plan: Return in about 6 months (around 01/26/2024) for 6 month fasting lab > 1 week later Follow-up PreDM Thyroid lab results.  Future labs 01/2024 A1c TSH T4 Lipid  Saralyn Pilar, DO [facility] 07/28/2023, 2:25 PM

## 2023-12-02 ENCOUNTER — Other Ambulatory Visit: Payer: Self-pay

## 2023-12-02 DIAGNOSIS — E291 Testicular hypofunction: Secondary | ICD-10-CM

## 2023-12-06 LAB — HEMOGLOBIN AND HEMATOCRIT, BLOOD
Hematocrit: 47.9 % (ref 37.5–51.0)
Hemoglobin: 15.9 g/dL (ref 13.0–17.7)

## 2023-12-06 LAB — TESTOSTERONE: Testosterone: 225 ng/dL — ABNORMAL LOW (ref 264–916)

## 2024-01-25 ENCOUNTER — Other Ambulatory Visit: Payer: Self-pay | Admitting: Urology

## 2024-01-25 DIAGNOSIS — N5201 Erectile dysfunction due to arterial insufficiency: Secondary | ICD-10-CM

## 2024-01-29 ENCOUNTER — Other Ambulatory Visit: Payer: Self-pay

## 2024-02-02 ENCOUNTER — Other Ambulatory Visit

## 2024-02-03 ENCOUNTER — Ambulatory Visit: Payer: Medicare Other | Admitting: Dermatology

## 2024-02-04 ENCOUNTER — Other Ambulatory Visit

## 2024-02-04 DIAGNOSIS — E039 Hypothyroidism, unspecified: Secondary | ICD-10-CM

## 2024-02-04 DIAGNOSIS — R7309 Other abnormal glucose: Secondary | ICD-10-CM

## 2024-02-04 LAB — BASIC METABOLIC PANEL WITHOUT GFR
BUN: 13 mg/dL (ref 7–25)
CO2: 28 mmol/L (ref 20–32)
Calcium: 9 mg/dL (ref 8.6–10.3)
Chloride: 106 mmol/L (ref 98–110)
Creat: 1.16 mg/dL (ref 0.70–1.28)
Glucose, Bld: 103 mg/dL — ABNORMAL HIGH (ref 65–99)
Potassium: 4.4 mmol/L (ref 3.5–5.3)
Sodium: 141 mmol/L (ref 135–146)

## 2024-02-04 LAB — HEMOGLOBIN A1C
Hgb A1c MFr Bld: 6 % — ABNORMAL HIGH (ref <5.7)
Mean Plasma Glucose: 126 mg/dL
eAG (mmol/L): 7 mmol/L

## 2024-02-04 LAB — TSH: TSH: 2.16 m[IU]/L (ref 0.40–4.50)

## 2024-02-04 LAB — T4, FREE: Free T4: 1.1 ng/dL (ref 0.8–1.8)

## 2024-02-08 ENCOUNTER — Other Ambulatory Visit: Payer: Self-pay | Admitting: Family Medicine

## 2024-02-08 ENCOUNTER — Encounter: Payer: Self-pay | Admitting: Family Medicine

## 2024-02-08 ENCOUNTER — Ambulatory Visit (INDEPENDENT_AMBULATORY_CARE_PROVIDER_SITE_OTHER): Payer: Self-pay | Admitting: Family Medicine

## 2024-02-08 VITALS — BP 118/78 | HR 71 | Ht 69.0 in | Wt 223.8 lb

## 2024-02-08 DIAGNOSIS — E785 Hyperlipidemia, unspecified: Secondary | ICD-10-CM | POA: Diagnosis not present

## 2024-02-08 DIAGNOSIS — R142 Eructation: Secondary | ICD-10-CM

## 2024-02-08 DIAGNOSIS — K219 Gastro-esophageal reflux disease without esophagitis: Secondary | ICD-10-CM | POA: Diagnosis not present

## 2024-02-08 DIAGNOSIS — E039 Hypothyroidism, unspecified: Secondary | ICD-10-CM

## 2024-02-08 DIAGNOSIS — R7309 Other abnormal glucose: Secondary | ICD-10-CM | POA: Diagnosis not present

## 2024-02-08 DIAGNOSIS — R61 Generalized hyperhidrosis: Secondary | ICD-10-CM

## 2024-02-08 DIAGNOSIS — N401 Enlarged prostate with lower urinary tract symptoms: Secondary | ICD-10-CM

## 2024-02-08 DIAGNOSIS — E782 Mixed hyperlipidemia: Secondary | ICD-10-CM

## 2024-02-08 DIAGNOSIS — G4733 Obstructive sleep apnea (adult) (pediatric): Secondary | ICD-10-CM | POA: Diagnosis not present

## 2024-02-08 MED ORDER — ATORVASTATIN CALCIUM 20 MG PO TABS: 20.0000 mg | ORAL_TABLET | Freq: Every day | ORAL | 3 refills | Status: AC

## 2024-02-08 MED ORDER — LEVOTHYROXINE SODIUM 25 MCG PO TABS: 25.0000 ug | ORAL_TABLET | Freq: Every day | ORAL | 3 refills | Status: AC

## 2024-02-08 NOTE — Patient Instructions (Addendum)
 Thank you for coming to the office today.  Recommend Prilosec OTC 20mg  daily before breakfast for 2-4 weeks for gas and burping acid production. Otherwise can trial Gas-x OTC. We can consider other med options in future. Goal may be to adjust diet if trigger foods.  Thyroid  lab is good.  Keep up the good work  Recent Labs    07/20/23 0841 02/04/24 0843  HGBA1C 6.0* 6.0*   Kidney function looks good.  DUE for FASTING BLOOD WORK (no food or drink after midnight before the lab appointment, only water or coffee without cream/sugar on the morning of)  SCHEDULE Lab Only visit in the morning at the clinic for lab draw in 5-6 MONTHS   - Make sure Lab Only appointment is at about 1 week before your next appointment, so that results will be available  For Lab Results, once available within 2-3 days of blood draw, you can can log in to MyChart online to view your results and a brief explanation. Also, we can discuss results at next follow-up visit.   Please schedule a Follow-up Appointment to: Return in about 5 months (around 07/10/2024) for 5-6 months fasting lab then 1 week later Medicare Physical.  If you have any other questions or concerns, please feel free to call the office or send a message through MyChart. You may also schedule an earlier appointment if necessary.  Additionally, you may be receiving a survey about your experience at our office within a few days to 1 week by e-mail or mail. We value your feedback.  Marsa Officer, DO [facility], [place]

## 2024-02-08 NOTE — Progress Notes (Signed)
 Subjective:    Patient ID: Cody Pearson, male    DOB: 04/03/1951, 73 y.o.   MRN: [id number]  Cody Pearson is a 73 y.o. male presenting on 02/08/2024 for Hypothyroidism and Pre-Diabetes   HPI  Discussed the use of AI scribe software for clinical note transcription with the patient, who gave verbal consent to proceed.  History of Present Illness   Cody Pearson is a 73 year old male with hypothyroidism and prediabetes who presents for a follow-up visit.   Gastrointestinal symptoms - Frequent burping and gas associated with various foods  Hyperhidrosis - Excessive sweating, described as 'sweating real bad' and a 'slimy feeling', even in winter - Family history of similar symptoms in his son, who takes medication for it  Hypothyroidism - Recent TSH 2.16 and free T4 1.1, indicating stable thyroid  function Controlled on Levothyroxine  25mcg daily - No new symptoms related to thyroid  dysfunction   OSA on CPAP - Patient reports prior history of dx OSA and on CPAP, prior to treatment initial symptoms were snoring, daytime sleepiness and fatigue, has had several sleep studies in the past. Most recent followed by [facility] - with sleep management  - Today reports that sleep apnea is well controlled. He uses the CPAP machine every night. Tolerates the machine well, and thinks that sleeps better with it and feels good. No new concerns or symptoms.   BPH ED Hypogonadism Followed by [facility] Continues to take Testosterone  cream topical On Tadalafil  5mg  daily for both BPH and for ED   S/p Partial Knee Replacement R Knee Takes antibiotic prophylaxis Clindamycin  (prior had allergic reaction and nausea vomiting on Amoxicillin) prior to dentist   Elevated A1c Lab result 6.0, prior range 5.8-6.0 - Monitors diet by reducing sweets and starches   HYPERLIPIDEMIA: - Reports no concerns. Last lipid panel 07/2023, controlled on statin LDL down to 85 He does have elevated  TG 154. - Currently taking Atorvastatin  20mg , tolerating well without side effects or myalgias       02/08/2024   11:29 AM 02/08/2024   11:28 AM 07/28/2023    2:13 PM  Depression screen PHQ 2/9  Decreased Interest 0 0 0  Down, Depressed, Hopeless 0 0 0  PHQ - 2 Score 0 0 0  Altered sleeping 0    Tired, decreased energy 0    Change in appetite 0    Feeling bad or failure about yourself  0    Trouble concentrating 0    Moving slowly or fidgety/restless 0    Suicidal thoughts 0    PHQ-9 Score 0    Difficult doing work/chores Not difficult at all         02/08/2024   11:29 AM 07/28/2023    2:13 PM 04/08/2023    9:40 AM 01/23/2023    9:40 AM  GAD 7 : Generalized Anxiety Score  Nervous, Anxious, on Edge 1 0 0 0  Control/stop worrying 0 0 0 0  Worry too much - different things 1 0 0 0  Trouble relaxing 0 0 0 0  Restless 0 0 0 0  Easily annoyed or irritable 0 0 0 0  Afraid - awful might happen 0 0 0 0  Total GAD 7 Score 2 0 0 0  Anxiety Difficulty Not difficult at all   Not difficult at all    Social History   Tobacco Use   Smoking status: Never   Smokeless tobacco: Never  Vaping Use  Vaping status: Never Used  Substance Use Topics   Alcohol use: Yes    Comment: wine occ   Drug use: No    Review of Systems Per HPI unless specifically indicated above     Objective:    BP 118/78 (BP Location: Left Arm, Patient Position: Sitting, Cuff Size: Normal)   Pulse 71   Ht 5' 9 (1.753 m)   Wt 223 lb 12.8 oz (101.5 kg)   SpO2 98%   BMI 33.05 kg/m   Wt Readings from Last 3 Encounters:  02/08/24 223 lb 12.8 oz (101.5 kg)  07/28/23 228 lb (103.4 kg)  06/03/23 220 lb (99.8 kg)    Physical Exam Vitals and nursing note reviewed.  Constitutional:      General: He is not in acute distress.    Appearance: Normal appearance. He is well-developed. He is not diaphoretic.     Comments: Well-appearing, comfortable, cooperative  HENT:     Head: Normocephalic and atraumatic.    Eyes:     General:        Right eye: No discharge.        Left eye: No discharge.     Conjunctiva/sclera: Conjunctivae normal.    Cardiovascular:     Rate and Rhythm: Normal rate.  Pulmonary:     Effort: Pulmonary effort is normal.   Skin:    General: Skin is warm and dry.     Findings: No erythema or rash.   Neurological:     Mental Status: He is alert and oriented to person, place, and time.   Psychiatric:        Mood and Affect: Mood normal.        Behavior: Behavior normal.        Thought Content: Thought content normal.     Comments: Well groomed, good eye contact, normal speech and thoughts     Results for orders placed or performed in visit on 02/04/24  TSH   Collection Time: 02/04/24  8:43 AM  Result Value Ref Range   TSH 2.16 0.40 - 4.50 mIU/L  BASIC METABOLIC PANEL WITH GFR   Collection Time: 02/04/24  8:43 AM  Result Value Ref Range   Glucose, Bld 103 (H) 65 - 99 mg/dL   BUN 13 7 - 25 mg/dL   Creat 8.83 9.29 - 8.71 mg/dL   BUN/Creatinine Ratio SEE NOTE: 6 - 22 (calc)   Sodium 141 135 - 146 mmol/L   Potassium 4.4 3.5 - 5.3 mmol/L   Chloride 106 98 - 110 mmol/L   CO2 28 20 - 32 mmol/L   Calcium  9.0 8.6 - 10.3 mg/dL  Hemoglobin J8r   Collection Time: 02/04/24  8:43 AM  Result Value Ref Range   Hgb A1c MFr Bld 6.0 (H) <5.7 %   Mean Plasma Glucose 126 mg/dL   eAG (mmol/L) 7.0 mmol/L  T4, free   Collection Time: 02/04/24  8:43 AM  Result Value Ref Range   Free T4 1.1 0.8 - 1.8 ng/dL      Assessment & Plan:   Problem List Items Addressed This Visit     Elevated hemoglobin A1c - Primary   Hyperlipidemia   Relevant Medications   atorvastatin  (LIPITOR) 20 MG tablet   Hypothyroidism (acquired)   Relevant Medications   levothyroxine  (SYNTHROID ) 25 MCG tablet   OSA on CPAP   Other Visit Diagnoses       Gastroesophageal reflux disease without esophagitis  Belching         Hyperhidrosis            Gastroesophageal Reflux Disease  (GERD) Frequent burping and gas, likely due to dietary triggers or gastroesophageal junction laxity. Treating stomach acid may alleviate symptoms. Recommend Prilosec OTC 20mg  daily before breakfast for 2-4 weeks for gas and burping acid production. Otherwise can trial Gas-x OTC.   Hypothyroidism Controlled TSH and free T4 levels normal, indicating well-managed thyroid  function with current levothyroxine  dosage (25 mcg). - Renew levothyroxine  25mcg daily prescription and send to [facility].  Prediabetes HbA1c is 6.0, consistent with prediabetes, indicating effective management with dietary modifications. - Continue monitoring HbA1c levels. - Advise on dietary modifications to reduce intake of sweets, starches, and carbohydrates.  Hyperhidrosis Excessive sweating in non-heat-related situations. Discussed potential off-label treatments with dermatologist consultation recommended. - Refer to dermatologist for further evaluation and management of hyperhidrosis.  General Health Maintenance Up to date with pneumonia vaccination. Informed about Prevnar 20 vaccine but not recommended at this time. - Consider Prevnar 20 vaccination if he changes mind.  Follow-up Scheduled for full panel blood test in December. Advised to check with front desk for MyChart access issues. - Schedule full panel blood test in early December. - Assist with MyChart access at the front desk.        No orders of the defined types were placed in this encounter.   Meds ordered this encounter  Medications   levothyroxine  (SYNTHROID ) 25 MCG tablet    Sig: Take 1 tablet (25 mcg total) by mouth daily before breakfast.    Dispense:  90 tablet    Refill:  3    Add refills for future   atorvastatin  (LIPITOR) 20 MG tablet    Sig: Take 1 tablet (20 mg total) by mouth daily. For cholesterol.    Dispense:  90 tablet    Refill:  3    Add refills for 1 year    Follow up plan: Return in about 5 months (around 07/10/2024)  for 5-6 months fasting lab then 1 week later Medicare Physical.  Future labs ordered for 07/11/24   Marsa Officer, DO [facility] 02/08/2024, 11:10 AM

## 2024-02-10 ENCOUNTER — Encounter: Payer: Self-pay | Admitting: Dermatology

## 2024-02-10 ENCOUNTER — Ambulatory Visit: Admitting: Dermatology

## 2024-02-10 DIAGNOSIS — L814 Other melanin hyperpigmentation: Secondary | ICD-10-CM | POA: Diagnosis not present

## 2024-02-10 DIAGNOSIS — D1801 Hemangioma of skin and subcutaneous tissue: Secondary | ICD-10-CM

## 2024-02-10 DIAGNOSIS — W908XXA Exposure to other nonionizing radiation, initial encounter: Secondary | ICD-10-CM

## 2024-02-10 DIAGNOSIS — L820 Inflamed seborrheic keratosis: Secondary | ICD-10-CM

## 2024-02-10 DIAGNOSIS — Z7189 Other specified counseling: Secondary | ICD-10-CM

## 2024-02-10 DIAGNOSIS — L570 Actinic keratosis: Secondary | ICD-10-CM | POA: Diagnosis not present

## 2024-02-10 DIAGNOSIS — Z85820 Personal history of malignant melanoma of skin: Secondary | ICD-10-CM

## 2024-02-10 DIAGNOSIS — L821 Other seborrheic keratosis: Secondary | ICD-10-CM | POA: Diagnosis not present

## 2024-02-10 DIAGNOSIS — Z1283 Encounter for screening for malignant neoplasm of skin: Secondary | ICD-10-CM

## 2024-02-10 DIAGNOSIS — Z79899 Other long term (current) drug therapy: Secondary | ICD-10-CM

## 2024-02-10 DIAGNOSIS — L578 Other skin changes due to chronic exposure to nonionizing radiation: Secondary | ICD-10-CM | POA: Diagnosis not present

## 2024-02-10 DIAGNOSIS — L918 Other hypertrophic disorders of the skin: Secondary | ICD-10-CM

## 2024-02-10 DIAGNOSIS — D229 Melanocytic nevi, unspecified: Secondary | ICD-10-CM

## 2024-02-10 DIAGNOSIS — Z5111 Encounter for antineoplastic chemotherapy: Secondary | ICD-10-CM

## 2024-02-10 MED ORDER — FLUOROURACIL 5 % EX CREA: TOPICAL_CREAM | Freq: Two times a day (BID) | CUTANEOUS | 0 refills | Status: AC

## 2024-02-10 NOTE — Progress Notes (Signed)
 Follow-Up Visit   Subjective  Cody Pearson is a 73 y.o. male who presents for the following: Skin Cancer Screening and Full Body Skin Exam  The patient presents for Total-Body Skin Exam (TBSE) for skin cancer screening and mole check. The patient has spots, moles and lesions to be evaluated, some may be new or changing and the patient may have concern these could be cancer.  The following portions of the chart were reviewed this encounter and updated as appropriate: medications, allergies, medical history  Review of Systems:  No other skin or systemic complaints except as noted in HPI or Assessment and Plan.  Objective  Well appearing patient in no apparent distress; mood and affect are within normal limits.  A full examination was performed including scalp, head, eyes, ears, nose, lips, neck, chest, axillae, abdomen, back, buttocks, bilateral upper extremities, bilateral lower extremities, hands, feet, fingers, toes, fingernails, and toenails. All findings within normal limits unless otherwise noted below.   Relevant physical exam findings are noted in the Assessment and Plan.  Scalp, forehead, temples x 6 (6) Erythematous thin papules/macules with gritty scale.  R forearm x 1, scalp and face x 20 (21) Erythematous stuck-on, waxy papule or plaque  Assessment & Plan   SKIN CANCER SCREENING PERFORMED TODAY.  ACTINIC DAMAGE - Chronic condition, secondary to cumulative UV/sun exposure - diffuse scaly erythematous macules with underlying dyspigmentation - Recommend daily broad spectrum sunscreen SPF 30+ to sun-exposed areas, reapply every 2 hours as needed.  - Staying in the shade or wearing long sleeves, sun glasses (UVA+UVB protection) and wide brim hats (4-inch brim around the entire circumference of the hat) are also recommended for sun protection.  - Call for new or changing lesions.  LENTIGINES, SEBORRHEIC KERATOSES, HEMANGIOMAS - Benign normal skin lesions -  Benign-appearing - Call for any changes  MELANOCYTIC NEVI - Tan-brown and/or pink-flesh-colored symmetric macules and papules - Benign appearing on exam today - Observation - Call clinic for new or changing moles - Recommend daily use of broad spectrum spf 30+ sunscreen to sun-exposed areas.   HISTORY OF MELANOMA - scalp, 2017 tx at [facility] with Mohs - No evidence of recurrence today - No lymphadenopathy - Recommend regular full body skin exams - Recommend daily broad spectrum sunscreen SPF 30+ to sun-exposed areas, reapply every 2 hours as needed.  - Call if any new or changing lesions are noted between office visits  ACTINIC KERATOSIS (6) Scalp, forehead, temples x 6 (6) Actinic keratoses are precancerous spots that appear secondary to cumulative UV radiation exposure/sun exposure over time. They are chronic with expected duration over 1 year. A portion of actinic keratoses will progress to squamous cell carcinoma of the skin. It is not possible to reliably predict which spots will progress to skin cancer and so treatment is recommended to prevent development of skin cancer.  Recommend daily broad spectrum sunscreen SPF 30+ to sun-exposed areas, reapply every 2 hours as needed.  Recommend staying in the shade or wearing long sleeves, sun glasses (UVA+UVB protection) and wide brim hats (4-inch brim around the entire circumference of the hat). Call for new or changing lesions.  ACTINIC DAMAGE WITH PRECANCEROUS ACTINIC KERATOSES Counseling for Topical Chemotherapy Management: Patient exhibits: - Severe, confluent actinic changes with pre-cancerous actinic keratoses that is secondary to cumulative UV radiation exposure over time - Condition that is severe; chronic, not at goal. - diffuse scaly erythematous macules and papules with underlying dyspigmentation - Discussed Prescription Field Treatment topical Chemotherapy for  Severe, Chronic Confluent Actinic Changes with Pre-Cancerous Actinic  Keratoses Field treatment involves treatment of an entire area of skin that has confluent Actinic Changes (Sun/ Ultraviolet light damage) and PreCancerous Actinic Keratoses by method of PhotoDynamic Therapy (PDT) and/or prescription Topical Chemotherapy agents such as 5-fluorouracil, 5-fluorouracil/calcipotriene, and/or imiquimod.  The purpose is to decrease the number of clinically evident and subclinical PreCancerous lesions to prevent progression to development of skin cancer by chemically destroying early precancer changes that may or may not be visible.  It has been shown to reduce the risk of developing skin cancer in the treated area. As a result of treatment, redness, scaling, crusting, and open sores may occur during treatment course. One or more than one of these methods may be used and may have to be used several times to control, suppress and eliminate the PreCancerous changes. Discussed treatment course, expected reaction, and possible side effects. - Recommend daily broad spectrum sunscreen SPF 30+ to sun-exposed areas, reapply every 2 hours as needed.  - Staying in the shade or wearing long sleeves, sun glasses (UVA+UVB protection) and wide brim hats (4-inch brim around the entire circumference of the hat) are also recommended. - Call for new or changing lesions. - Starting September 1st apply to the top of the scalp, forehead, and temples BID x 7 days. Destruction of lesion - Scalp, forehead, temples x 6 (6) Complexity: simple   Destruction method: cryotherapy   Informed consent: discussed and consent obtained   Timeout:  patient name, date of birth, surgical site, and procedure verified Lesion destroyed using liquid nitrogen: Yes   Region frozen until ice ball extended beyond lesion: Yes   Outcome: patient tolerated procedure well with no complications   Post-procedure details: wound care instructions given    INFLAMED SEBORRHEIC KERATOSIS (21) R forearm x 1, scalp and face x 20  (21) Symptomatic, irritating, patient would like treated. Destruction of lesion - R forearm x 1, scalp and face x 20 (21) Complexity: simple   Destruction method: cryotherapy   Informed consent: discussed and consent obtained   Timeout:  patient name, date of birth, surgical site, and procedure verified Lesion destroyed using liquid nitrogen: Yes   Region frozen until ice ball extended beyond lesion: Yes   Outcome: patient tolerated procedure well with no complications   Post-procedure details: wound care instructions given     Acrochordons (Skin Tags) - Fleshy, skin-colored pedunculated papules - Benign appearing.  - Observe. - If desired, they can be removed with an in office procedure that is not covered by insurance. - Please call the clinic if you notice any new or changing lesions.  Return in about 1 year (around 02/09/2025) for TBSE - hx MM, AK.  LILLETTE Rosina Mayans, CMA, am acting as scribe for Alm Rhyme, MD .   Documentation: I have reviewed the above documentation for accuracy and completeness, and I agree with the above.  Alm Rhyme, MD

## 2024-02-10 NOTE — Patient Instructions (Addendum)
 Instructions for [facility] Medications  One or more of your medications was sent to the [facility] mail order compounding pharmacy. You will receive an email from them and can purchase the medicine through that link. It will then be mailed to your home at the address you confirmed. If for any reason you do not receive an email from them, please check your spam folder. If you still do not find the email, please let us know. [facility] phone number is [phone].       Due to recent changes in healthcare laws, you may see results of your pathology and/or laboratory studies on MyChart before the doctors have had a chance to review them. We understand that in some cases there may be results that are confusing or concerning to you. Please understand that not all results are received at the same time and often the doctors may need to interpret multiple results in order to provide you with the best plan of care or course of treatment. Therefore, we ask that you please give [place] 2 business days to thoroughly review all your results before contacting the office for clarification. Should we see a critical lab result, you will be contacted sooner.   If You Need Anything After Your Visit  If you have any questions or concerns for your doctor, please call our main line at [phone] and press option 4 to reach your doctor's medical assistant. If no one answers, please leave a voicemail as directed and we will return your call as soon as possible. Messages left after 4 pm will be answered the following business day.   You may also send [place] a message via MyChart. We typically respond to MyChart messages within 1-2 business days.  For prescription refills, please ask your pharmacy to contact our office. Our fax number is [phone].  If you have an urgent issue when the clinic is closed that cannot wait until the next business day, you can page your doctor at the number below.    Please note  that while we do our best to be available for urgent issues outside of office hours, we are not available 24/7.   If you have an urgent issue and are unable to reach [place], you may choose to seek medical care at your doctor's office, retail clinic, urgent care center, or emergency room.  If you have a medical emergency, please immediately call 911 or go to the emergency department.  Pager Numbers  - Dr. Gwen Pounds: [phone]  - Dr. Roseanne Reno: [phone]  - Dr. Katrinka Blazing: [phone]   In the event of inclement weather, please call our main line at [phone] for an update on the status of any delays or closures.  Dermatology Medication Tips: Please keep the boxes that topical medications come in in order to help keep track of the instructions about where and how to use these. Pharmacies typically print the medication instructions only on the boxes and not directly on the medication tubes.   If your medication is too expensive, please contact our office at [phone] option 4 or send [place] a message through MyChart.   We are unable to tell what your co-pay for medications will be in advance as this is different depending on your insurance coverage. However, we may be able to find a substitute medication at lower cost or fill out paperwork to get insurance to cover a needed medication.   If a prior authorization is required to get your medication covered by your  insurance company, please allow [place] 1-2 business days to complete this process.  Drug prices often vary depending on where the prescription is filled and some pharmacies may offer cheaper prices.  The website www.goodrx.com contains coupons for medications through different pharmacies. The prices here do not account for what the cost may be with help from insurance (it may be cheaper with your insurance), but the website can give you the price if you did not use any insurance.  - You can print the associated coupon and take it with your  prescription to the pharmacy.  - You may also stop by our office during regular business hours and pick up a GoodRx coupon card.  - If you need your prescription sent electronically to a different pharmacy, notify our office through [facility] or by phone at [phone] option 4.     Si Usted Necesita Algo Despus de Su Visita  Tambin puede enviarnos un mensaje a travs de Clinical cytogeneticist. Por lo general respondemos a los mensajes de MyChart en el transcurso de 1 a 2 das hbiles.  Para renovar recetas, por favor pida a su farmacia que se ponga en contacto con nuestra oficina. Annie Sable de fax es [place] [phone].  Si tiene un asunto urgente cuando la clnica est cerrada y que no puede esperar hasta el siguiente da hbil, puede llamar/localizar a su doctor(a) al nmero que aparece a continuacin.   Por favor, tenga en cuenta que aunque hacemos todo lo posible para estar disponibles para asuntos urgentes fuera del horario de [place], no estamos disponibles las 24 horas del da, los 7 [address]  [address] de la [place].   Si tiene un problema urgente y no puede comunicarse con nosotros, puede optar por buscar atencin mdica  en el consultorio de su doctor(a), en una clnica privada, en un centro de atencin urgente o en una sala de emergencias.  Si tiene Engineer, drilling, por favor llame inmediatamente al 911 o vaya a la sala de emergencias.  Nmeros de bper  - Dr. Gwen Pounds: [phone]  - Dra. Roseanne Reno: [phone]  - Dr. Katrinka Blazing: [phone]   En caso de inclemencias del tiempo, por favor llame a Lacy Duverney principal al [phone] para una actualizacin sobre el [place] de cualquier retraso o cierre.  Consejos para la medicacin en dermatologa: Por favor, guarde las cajas en las que vienen los medicamentos de uso tpico para ayudarle a seguir las instrucciones sobre dnde y cmo usarlos. Las farmacias generalmente imprimen las instrucciones del medicamento slo en las cajas y no  directamente en los tubos del [place].   Si su medicamento es muy caro, por favor, pngase en contacto con Rolm Gala llamando al [phone] y presione la opcin 4 o envenos un mensaje a travs de Clinical cytogeneticist.   No podemos decirle cul ser su copago por los medicamentos por adelantado ya que esto es diferente dependiendo de la cobertura de su seguro. Sin embargo, es posible que podamos encontrar un medicamento sustituto a Audiological scientist un formulario para que el seguro cubra el medicamento que se considera necesario.   Si se requiere una autorizacin previa para que su compaa de seguros [place] su medicamento, por favor permtanos de 1 a 2 das hbiles para completar [address]  Los precios de los medicamentos varan con frecuencia dependiendo del Environmental consultant de dnde se surte la receta y alguna farmacias pueden ofrecer precios ms baratos.  El sitio web www.goodrx.com tiene cupones para medicamentos de Health and safety inspector. Los precios aqu no tienen  en cuenta lo que podra costar con la ayuda del seguro (puede ser ms barato con su seguro), pero el sitio web puede darle el precio si no Visual merchandiser.  - Puede imprimir el cupn correspondiente y llevarlo con su receta a la farmacia.  - Tambin puede pasar por nuestra oficina durante el horario de atencin regular y Education officer, museum una tarjeta de cupones de GoodRx.  - Si necesita que su receta se enve electrnicamente a una farmacia diferente, informe a nuestra oficina a travs de MyChart de [place] o por telfono llamando al [phone] y presione la opcin 4.

## 2024-03-11 ENCOUNTER — Other Ambulatory Visit: Payer: Self-pay | Admitting: Urology

## 2024-03-21 ENCOUNTER — Telehealth: Payer: Self-pay

## 2024-03-21 NOTE — Telephone Encounter (Signed)
 Called [place] number on [facility].  T was approved 7/725-03/16/25  Ref [id number]

## 2024-05-06 ENCOUNTER — Ambulatory Visit: Payer: Self-pay

## 2024-05-06 DIAGNOSIS — Z0000 Encounter for general adult medical examination without abnormal findings: Secondary | ICD-10-CM

## 2024-05-06 NOTE — Patient Instructions (Addendum)
 [name],  Thank you for taking the time for your Medicare Wellness Visit. I appreciate your continued commitment to your health goals. Please review the care plan we discussed, and feel free to reach out if I can assist you further.  Medicare recommends these wellness visits once per year to help you and your care team stay ahead of potential health issues. These visits are designed to focus on prevention, allowing your provider to concentrate on managing your acute and chronic conditions during your regular appointments.  Please note that Annual Wellness Visits do not include a physical exam. Some assessments may be limited, especially if the visit was conducted virtually. If needed, we may recommend a separate in-person follow-up with your provider.  Ongoing Care Seeing your primary care provider every 3 to 6 months helps us  monitor your health and provide consistent, personalized care.   Referrals If a referral was made during today's visit and you haven't received any updates within two weeks, please contact the referred provider directly to check on the status.  Recommended Screenings:  Health Maintenance  Topic Date Due   Zoster (Shingles) Vaccine (1 of 2) Never done   Flu Shot  03/11/2024   Medicare Annual Wellness Visit  05/06/2025   Colon Cancer Screening  03/27/2028   Pneumococcal Vaccine for age over 48  Completed   Hepatitis C Screening  Completed   HPV Vaccine  Aged Out   Meningitis B Vaccine  Aged Out   DTaP/Tdap/Td vaccine  Discontinued   COVID-19 Vaccine  Discontinued       05/06/2024   10:19 AM  Advanced Directives  Does Patient Have a Medical Advance Directive? No  Would patient like information on creating a medical advance directive? No - Patient declined   Advance Care Planning is important because it: Ensures you receive medical care that aligns with your values, goals, and preferences. Provides guidance to your family and loved ones, reducing the  emotional burden of decision-making during critical moments.  Vision: Annual vision screenings are recommended for early detection of glaucoma, cataracts, and diabetic retinopathy. These exams can also reveal signs of chronic conditions such as diabetes and high blood pressure.  Dental: Annual dental screenings help detect early signs of oral cancer, gum disease, and other conditions linked to overall health, including heart disease and diabetes.  Please see the attached documents for additional preventive care recommendations.   NEXT AWV 10/9/(26 @ 10:50 AM BY PHONE

## 2024-05-06 NOTE — Progress Notes (Signed)
 Subjective:   Cody Pearson is a 73 y.o. who presents for a Medicare Wellness preventive visit.  As a reminder, Annual Wellness Visits don't include a physical exam, and some assessments may be limited, especially if this visit is performed virtually. We may recommend an in-person follow-up visit with your provider if needed.  Visit Complete: Virtual I connected with  Cody Pearson on 05/06/24 by a audio enabled telemedicine application and verified that I am speaking with the correct person using two identifiers.  Patient Location: Home  Provider Location: Home Office  I discussed the limitations of evaluation and management by telemedicine. The patient expressed understanding and agreed to proceed.  Vital Signs: Because this visit was a virtual/telehealth visit, some criteria may be missing or patient reported. Any vitals not documented were not able to be obtained and vitals that have been documented are patient reported.  VideoDeclined- This patient declined Librarian, academic. Therefore the visit was completed with audio only.  Persons Participating in Visit: Patient.  AWV Questionnaire: No: Patient Medicare AWV questionnaire was not completed prior to this visit.  Cardiac Risk Factors include: advanced age (>68men, >72 women);dyslipidemia;male gender;obesity (BMI >30kg/m2)     Objective:    There were no vitals filed for this visit. There is no height or weight on file to calculate BMI.     05/06/2024   10:19 AM 05/01/2023   10:18 AM 10/14/2017    8:12 PM 10/13/2017    6:10 AM 09/30/2017    7:56 AM  Advanced Directives  Does Patient Have a Medical Advance Directive? No No No  No  No   Would patient like information on creating a medical advance directive? No - Patient declined No - Patient declined No - Patient declined  No - Patient declined  No - Patient declined      Data saved with a previous flowsheet row definition    Current  Medications (verified) Outpatient Encounter Medications as of 05/06/2024  Medication Sig   acetaminophen  (TYLENOL ) 650 MG CR tablet Take 650 mg by mouth every 8 (eight) hours as needed for pain.   AMBULATORY NON FORMULARY MEDICATION Testsosterone 20% (200mg /ml Cream)  Apply 1 ml to upper arms or thights once daily.   atorvastatin  (LIPITOR) 20 MG tablet Take 1 tablet (20 mg total) by mouth daily. For cholesterol.   fluorouracil  (EFUDEX ) 5 % cream Apply topically 2 (two) times daily. Apply to the top of the scalp, forehead, and temples BID x 7 days   ketoconazole  (NIZORAL ) 2 % cream Apply 1 Application topically daily.   levothyroxine  (SYNTHROID ) 25 MCG tablet Take 1 tablet (25 mcg total) by mouth daily before breakfast.   tadalafil  (CIALIS ) 5 MG tablet TAKE 1 TABLET(5 MG) BY MOUTH DAILY   Testosterone  1.62 % GEL APPLY 1 PUMP TO EACH SHOULDER DAILY   ciclopirox  (LOPROX ) 0.77 % SUSP Apply 1 application topically at bedtime. (Patient not taking: Reported on 05/06/2024)   Multiple Vitamins-Minerals (CENTRUM SILVER PO) Take 1 tablet by mouth daily. (Patient not taking: Reported on 05/06/2024)   No facility-administered encounter medications on file as of 05/06/2024.    Allergies (verified) Penicillins and Amoxicillin   History: Past Medical History:  Diagnosis Date   Actinic keratosis 03/28/2020   Arthritis    Cancer (HCC)    melanoma   History of colonic polyps    Hyperlipidemia    Melanoma (HCC) ~2017   scalp, [facility] at [facility]   Sleep apnea  hasn't started using CPAP yet   Past Surgical History:  Procedure Laterality Date   COLONOSCOPY     KNEE ARTHROSCOPY Right    MEDIAL PARTIAL KNEE REPLACEMENT Right    PARTIAL KNEE ARTHROPLASTY Right 10/13/2017   Procedure: UNICOMPARTMENTAL KNEE;  Surgeon: Edie Norleen PARAS, MD;  Location: [facility] ORS;  Service: Orthopedics;  Laterality: Right;   Family History  Problem Relation Age of Onset   Aneurysm Paternal Grandmother    Colon cancer Neg Hx     Esophageal cancer Neg Hx    Rectal cancer Neg Hx    Stomach cancer Neg Hx    Social History   Socioeconomic History   Marital status: Married    Spouse name: Rufino Staup   Number of children: 3   Years of education: Not on file   Highest education level: Not on file  Occupational History   Occupation: Maintenance    Employer: [employer]     Comment: Retired 02/2021   Occupation: Retired    Comment: 03/08/2021  Tobacco Use   Smoking status: Never   Smokeless tobacco: Never  Vaping Use   Vaping status: Never Used  Substance and Sexual Activity   Alcohol use: Yes    Comment: wine occ   Drug use: No   Sexual activity: Not on file  Other Topics Concern   Not on file  Social History Narrative   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   Highest level of education 12th grade.   Married.   Lives locally.   Social Drivers of Corporate investment banker Strain: Low Risk  (05/06/2024)   Overall Financial Resource Strain (CARDIA)    Difficulty of Paying Living Expenses: Not hard at all  Food Insecurity: No Food Insecurity (05/06/2024)   Hunger Vital Sign    Worried About Running Out of Food in the Last Year: Never true    Ran Out of Food in the Last Year: Never true  Transportation Needs: No Transportation Needs (05/06/2024)   PRAPARE - Administrator, Civil Service (Medical): No    Lack of Transportation (Non-Medical): No  Physical Activity: Sufficiently Active (05/06/2024)   Exercise Vital Sign    Days of Exercise per Week: 7 days    Minutes of Exercise per Session: 30 min  Stress: No Stress Concern Present (05/06/2024)   [employer] of Occupational Health - Occupational Stress Questionnaire    Feeling of Stress: Not at all  Social Connections: Moderately Isolated (05/06/2024)   Social Connection and Isolation Panel    Frequency of Communication with Friends and Family: Twice a week    Frequency of Social Gatherings with Friends and Family:  Once a week    Attends Religious Services: Never    Database administrator or Organizations: No    Attends Engineer, structural: Never    Marital Status: Married    Tobacco Counseling Counseling given: Not Answered    Clinical Intake:  Pre-visit preparation completed: Yes  Pain : No/denies pain     BMI - recorded: 32.9 Nutritional Status: BMI > 30  Obese Nutritional Risks: None Diabetes: No  Lab Results  Component Value Date   HGBA1C 6.0 (H) 02/04/2024   HGBA1C 6.0 (H) 07/20/2023   HGBA1C 5.8 (H) 01/16/2023     How often do you need to have someone help you when you read instructions, pamphlets, or other written materials from your doctor or pharmacy?: 1 - Never  Interpreter  Needed?: No  Information entered by :: Cody DAS, Cody Pearson   Activities of Daily Living     05/06/2024   10:20 AM  In your present state of health, do you have any difficulty performing the following activities:  Hearing? 0  Vision? 0  Difficulty concentrating or making decisions? 0  Walking or climbing stairs? 0  Dressing or bathing? 0  Doing errands, shopping? 0  Preparing Food and eating ? N  Using the Toilet? N  In the past six months, have you accidently leaked urine? N  Do you have problems with loss of bowel control? N  Managing your Medications? N  Managing your Finances? N  Housekeeping or managing your Housekeeping? N    Patient Care Team: Edman Marsa PARAS, DO as PCP - General (Family Medicine) [facility], [facility]  I have updated your Care Teams any recent Medical Services you may have received from other providers in the past year.     Assessment:   This is a routine wellness examination for Cody Pearson.  Hearing/Vision screen Hearing Screening - Comments:: NO AIDS Vision Screening - Comments:: WEARS GLASSES ALL DAY- [facility]- APPT NEXT MONTH   Goals Addressed             This Visit's Progress    DIET - INCREASE WATER INTAKE          Depression Screen     05/06/2024   10:17 AM 02/08/2024   11:29 AM 02/08/2024   11:28 AM 07/28/2023    2:13 PM 05/01/2023   10:17 AM 04/08/2023    9:40 AM 01/23/2023    9:40 AM  PHQ 2/9 Scores  PHQ - 2 Score 0 0 0 0 0 0 0  PHQ- 9 Score 0 0   0  1    Fall Risk     05/06/2024   10:20 AM 02/08/2024   11:28 AM 07/28/2023    2:13 PM 05/01/2023   10:19 AM 04/08/2023    9:40 AM  Fall Risk   Falls in the past year? 0 0 0 0 0  Number falls in past yr: 0   0   Injury with Fall? 0   0   Risk for fall due to : No Fall Risks No Fall Risks  No Fall Risks   Follow up Falls evaluation completed;Falls prevention discussed   Falls prevention discussed;Falls evaluation completed     MEDICARE RISK AT HOME:  Medicare Risk at Home Any stairs in or around the home?: Yes If so, are there any without handrails?: No Home free of loose throw rugs in walkways, pet beds, electrical cords, etc?: Yes Adequate lighting in your home to reduce risk of falls?: Yes Life alert?: No Use of a cane, walker or w/c?: No Grab bars in the bathroom?: No Shower chair or bench in shower?: No Elevated toilet seat or a handicapped toilet?: Yes  TIMED UP AND GO:  Was the test performed?  No  Cognitive Function: 6CIT completed        05/06/2024   10:22 AM 05/01/2023   10:20 AM 04/28/2022    1:39 PM  6CIT Screen  What Year? 0 points 0 points 0 points  What month? 0 points 0 points 0 points  What time? 0 points 0 points 0 points  Count back from 20 0 points 0 points 0 points  Months in reverse 0 points 0 points 0 points  Repeat phrase 2 points 4 points 10 points  Total Score 2 points 4 points 10 points    Immunizations Immunization History  Administered Date(s) Administered   Fluad Quad(high Dose 65+) 05/25/2020   Influenza,inj,Quad PF,6+ Mos 05/12/2017   Influenza-Unspecified 06/06/2022, 04/21/2023   Moderna Sars-Covid-2 Vaccination 09/19/2019, 10/17/2019, 06/22/2020, 04/21/2023   Pneumococcal Conjugate-13  04/04/2019   Pneumococcal Polysaccharide-23 04/20/2020   Tdap 12/12/2010    Screening Tests Health Maintenance  Topic Date Due   Zoster Vaccines- Shingrix (1 of 2) Never done   Influenza Vaccine  03/11/2024   Medicare Annual Wellness (AWV)  05/06/2025   Colonoscopy  03/27/2028   Pneumococcal Vaccine: 50+ Years  Completed   Hepatitis C Screening  Completed   HPV VACCINES  Aged Out   Meningococcal B Vaccine  Aged Out   DTaP/Tdap/Td  Discontinued   COVID-19 Vaccine  Discontinued    Health Maintenance Items Addressed: NEEDS TDAP, SHINGRIX, FLU-UP TO DATE ON COLONOSCOPY   Additional Screening:  Vision Screening: Recommended annual ophthalmology exams for early detection of glaucoma and other disorders of the eye. Is the patient up to date with their annual eye exam?  Yes  Who is the provider or what is the name of the office in which the patient attends annual eye exams? [facility]  Dental Screening: Recommended annual dental exams for proper oral hygiene  Community Resource Referral / Chronic Care Management: CRR required this visit?  No   CCM required this visit?  No   Plan:    I have personally reviewed and noted the following in the patient's chart:   Medical and social history Use of alcohol, tobacco or illicit drugs  Current medications and supplements including opioid prescriptions. Patient is not currently taking opioid prescriptions. Functional ability and status Nutritional status Physical activity Advanced directives List of other physicians Hospitalizations, surgeries, and ER visits in previous 12 months Vitals Screenings to include cognitive, depression, and falls Referrals and appointments  In addition, I have reviewed and discussed with patient certain preventive protocols, quality metrics, and best practice recommendations. A written personalized care plan for preventive services as well as general preventive health recommendations were provided to  patient.   Cody GORMAN Das, Cody Pearson   [id number]   After Visit Summary: (MyChart) Due to this being a telephonic visit, the after visit summary with patients personalized plan was offered to patient via MyChart   Notes: Nothing significant to report at this time.

## 2024-06-01 ENCOUNTER — Other Ambulatory Visit: Payer: Self-pay

## 2024-06-01 DIAGNOSIS — N401 Enlarged prostate with lower urinary tract symptoms: Secondary | ICD-10-CM

## 2024-06-02 ENCOUNTER — Other Ambulatory Visit: Payer: Self-pay

## 2024-06-02 DIAGNOSIS — N401 Enlarged prostate with lower urinary tract symptoms: Secondary | ICD-10-CM

## 2024-06-02 DIAGNOSIS — E291 Testicular hypofunction: Secondary | ICD-10-CM

## 2024-06-02 DIAGNOSIS — R3912 Poor urinary stream: Secondary | ICD-10-CM | POA: Diagnosis not present

## 2024-06-03 ENCOUNTER — Encounter: Payer: Self-pay | Admitting: Urology

## 2024-06-03 ENCOUNTER — Ambulatory Visit (INDEPENDENT_AMBULATORY_CARE_PROVIDER_SITE_OTHER): Payer: Self-pay | Admitting: Urology

## 2024-06-03 VITALS — BP 107/72 | HR 80 | Ht 69.0 in | Wt 220.0 lb

## 2024-06-03 DIAGNOSIS — E291 Testicular hypofunction: Secondary | ICD-10-CM | POA: Diagnosis not present

## 2024-06-03 DIAGNOSIS — Z125 Encounter for screening for malignant neoplasm of prostate: Secondary | ICD-10-CM

## 2024-06-03 DIAGNOSIS — R972 Elevated prostate specific antigen [PSA]: Secondary | ICD-10-CM

## 2024-06-03 LAB — HEMOGLOBIN AND HEMATOCRIT, BLOOD
Hematocrit: 48.6 % (ref 37.5–51.0)
Hemoglobin: 16.2 g/dL (ref 13.0–17.7)

## 2024-06-03 LAB — PSA: Prostate Specific Ag, Serum: 1 ng/mL (ref 0.0–4.0)

## 2024-06-03 LAB — TESTOSTERONE: Testosterone: 216 ng/dL — ABNORMAL LOW (ref 264–916)

## 2024-06-03 NOTE — Progress Notes (Signed)
 06/03/2024 8:38 AM   Alm Pearson Cody [birth year] [id number]  Referring provider: Edman Marsa PARAS, DO [address] [place],  [place] [address]  Chief Complaint  Patient presents with   Benign Prostatic Hypertrophy   Urologic history  1. Hypogonadism Symptoms inlcude tiredness, fatigue, and erectile dysfunction.  Topical testosterone .    2. Erectile dysfunction Risk factors hypogonadism Tadalafil  5 mg daily   3. BPH with LUTS Moderate lower urinary tract symptoms Tadalafil  5 mg daily.  HPI: Cody Pearson is a 73 y.o. male who presents for annual follow-up of hypogonadism  No problems since last visit.  States he has good libido and energy on TRT.  Tadalafil  has been effective for his ED and lower urinary tract symptoms Denies dysuria, gross hematuria No flank, abdominal or pelvic pain Labs 06/02/2024: Testosterone  216 ng/dL, PSA 1.0, Y/Y83.7/51.3   PMH: Past Medical History:  Diagnosis Date   Actinic keratosis 03/28/2020   Arthritis    Cancer (HCC)    melanoma   History of colonic polyps    Hyperlipidemia    Melanoma (HCC) ~2017   scalp, txted at [facility]   Sleep apnea    hasn't started using CPAP yet    Surgical History: Past Surgical History:  Procedure Laterality Date   COLONOSCOPY     KNEE ARTHROSCOPY Right    MEDIAL PARTIAL KNEE REPLACEMENT Right    PARTIAL KNEE ARTHROPLASTY Right 10/13/2017   Procedure: UNICOMPARTMENTAL KNEE;  Surgeon: Edie Norleen PARAS, MD;  Location: [facility] ORS;  Service: Orthopedics;  Laterality: Right;    Home Medications:  Allergies as of 06/03/2024       Reactions   Penicillins    nausea   Amoxicillin Nausea Only   Nausea, anorexia, body aches        Medication List        Accurate as of June 03, 2024  8:38 AM. If you have any questions, ask your nurse or doctor.          STOP taking these medications    CENTRUM SILVER PO   ciclopirox  0.77 % Susp Commonly known as: LOPROX        TAKE these  medications    acetaminophen  650 MG CR tablet Commonly known as: TYLENOL  Take 650 mg by mouth every 8 (eight) hours as needed for pain.   AMBULATORY NON FORMULARY MEDICATION Testsosterone 20% (200mg /ml Cream)  Apply 1 ml to upper arms or thights once daily.   atorvastatin  20 MG tablet Commonly known as: LIPITOR Take 1 tablet (20 mg total) by mouth daily. For cholesterol.   fluorouracil  5 % cream Commonly known as: EFUDEX  Apply topically 2 (two) times daily. Apply to the top of the scalp, forehead, and temples BID x 7 days   ketoconazole  2 % cream Commonly known as: NIZORAL  Apply 1 Application topically daily.   levothyroxine  25 MCG tablet Commonly known as: SYNTHROID  Take 1 tablet (25 mcg total) by mouth daily before breakfast.   tadalafil  5 MG tablet Commonly known as: CIALIS  TAKE 1 TABLET(5 MG) BY MOUTH DAILY   Testosterone  1.62 % Gel APPLY 1 PUMP TO EACH SHOULDER DAILY        Allergies:  Allergies  Allergen Reactions   Penicillins     nausea   Amoxicillin Nausea Only    Nausea, anorexia, body aches    Family History: Family History  Problem Relation Age of Onset   Aneurysm Paternal Grandmother    Colon cancer Neg Hx  Esophageal cancer Neg Hx    Rectal cancer Neg Hx    Stomach cancer Neg Hx     Social History:  reports that he has never smoked. He has never used smokeless tobacco. He reports current alcohol use. He reports that he does not use drugs.   Physical Exam: BP 107/72   Pulse 80   Ht 5' 9 (1.753 m)   Wt 220 lb (99.8 kg)   BMI 32.49 kg/m   Constitutional:  Alert, No acute distress. HEENT: [place] AT Respiratory: Normal respiratory effort, no increased work of breathing. Psychiatric: Normal mood and affect.   Assessment & Plan:    1.  Hypogonadism Levels have been consistently low however he has no bothersome symptoms and desires to continue present management Lab visit 6 months testosterone , H/H Annual office visit with  testosterone , H/H, PSA  2.  BPH with LUTS Stable, continue tadalafil   3.  Erectile dysfunction Stable, continue tadalafil    Glendia JAYSON Barba, MD  [facility] [address], [address] [place], [place] [address] [phone]

## 2024-06-07 ENCOUNTER — Other Ambulatory Visit: Payer: Self-pay | Admitting: Family Medicine

## 2024-06-07 DIAGNOSIS — E785 Hyperlipidemia, unspecified: Secondary | ICD-10-CM

## 2024-06-09 NOTE — Telephone Encounter (Signed)
 Too soon for refill, LRF 02/08/24 for 90 and 3 RF.  Requested Prescriptions  Pending Prescriptions Disp Refills   atorvastatin  (LIPITOR) 20 MG tablet [Pharmacy Med Name: ATORVASTATIN  20MG  TABLETS] 90 tablet 3    Sig: TAKE 1 TABLET(20 MG) BY MOUTH DAILY FOR CHOLESTEROL     Cardiovascular:  Antilipid - Statins Failed - 06/09/2024  9:07 AM      Failed - Lipid Panel in normal range within the last 12 months    Cholesterol  Date Value Ref Range Status  07/20/2023 143 <200 mg/dL Final   LDL Cholesterol (Calc)  Date Value Ref Range Status  07/20/2023 85 mg/dL (calc) Final    Comment:    Reference range: <100 . Desirable range <100 mg/dL for primary prevention;   <70 mg/dL for patients with CHD or diabetic patients  with > or = 2 CHD risk factors. SABRA LDL-C is now calculated using the Martin-Hopkins  calculation, which is a validated novel method providing  better accuracy than the Friedewald equation in the  estimation of LDL-C.  Gladis APPLETHWAITE et al. SANDREA. 7986;689(80): 2061-2068  (http://education.[facility].com/faq/FAQ164)    HDL  Date Value Ref Range Status  07/20/2023 32 (L) > OR = 40 mg/dL Final   Triglycerides  Date Value Ref Range Status  07/20/2023 154 (H) <150 mg/dL Final         Passed - Patient is not pregnant      Passed - Valid encounter within last 12 months    Recent Outpatient Visits           4 months ago Elevated hemoglobin A1c   [facility] Edman Marsa PARAS, DO       Future Appointments             In 8 months Hester Alm BROCKS, MD [facility]   In 11 months Stoioff, Glendia BROCKS, MD [facility]

## 2024-06-14 ENCOUNTER — Encounter: Payer: Self-pay | Admitting: Sleep Medicine

## 2024-06-14 ENCOUNTER — Ambulatory Visit (INDEPENDENT_AMBULATORY_CARE_PROVIDER_SITE_OTHER): Admitting: Sleep Medicine

## 2024-06-14 VITALS — BP 100/60 | HR 74 | Temp 98.3°F | Ht 69.0 in | Wt 228.8 lb

## 2024-06-14 DIAGNOSIS — E039 Hypothyroidism, unspecified: Secondary | ICD-10-CM | POA: Diagnosis not present

## 2024-06-14 DIAGNOSIS — G4733 Obstructive sleep apnea (adult) (pediatric): Secondary | ICD-10-CM

## 2024-06-14 NOTE — Progress Notes (Signed)
 Cody Pearson MRN: [id number] DOB: [birth year]   CHIEF COMPLAINT:  CPAP F/U   HISTORY OF PRESENT ILLNESS: Mr. Rathgeber is a 73 y.o. w/ a h/o OSA, hypothyroidism, hyperlipidemia and obesity who presents for CPAP F/U visit. Reports using CPAP therapy every night, which is confirmed by compliance data. He is currently using the Airfit F20 FFM, which is comfortable. Reports feeling more refreshed upon awakening with CPAP therapy.     EPWORTH SLEEP SCORE    06/26/2021    9:40 AM 12/05/2020    9:00 AM  Results of the Epworth flowsheet  Sitting and reading 0 1  Watching TV 1 0  Sitting, inactive in a public place (e.g. a theatre or a meeting) 0 0  As a passenger in a car for an hour without a break 0 0  Lying down to rest in the afternoon when circumstances permit 2 2  Sitting and talking to someone 0 0  Sitting quietly after a lunch without alcohol 0 0  In a car, while stopped for a few minutes in traffic 0 0  Total score 3 3   PAST MEDICAL HISTORY :   has a past medical history of Actinic keratosis (03/28/2020), Arthritis, Cancer (HCC), History of colonic polyps, Hyperlipidemia, Melanoma (HCC) (~2017), and Sleep apnea.  has a past surgical history that includes Knee arthroscopy (Right); Partial knee arthroplasty (Right, 10/13/2017); Colonoscopy; and Medial partial knee replacement (Right). Prior to Admission medications   Medication Sig Start Date End Date Taking? Authorizing Provider  acetaminophen  (TYLENOL ) 650 MG CR tablet Take 650 mg by mouth every 8 (eight) hours as needed for pain.   Yes [provider]  AMBULATORY NON FORMULARY MEDICATION Testsosterone 20% (200mg /ml Cream)  Apply 1 ml to upper arms or thights once daily. 02/10/23  Yes Vaillancourt, Samantha, PA-C  atorvastatin  (LIPITOR) 20 MG tablet Take 1 tablet (20 mg total) by mouth daily. For cholesterol. 02/08/24  Yes Karamalegos, Marsa PARAS, DO  fluorouracil  (EFUDEX ) 5 % cream Apply topically 2  (two) times daily. Apply to the top of the scalp, forehead, and temples BID x 7 days 02/10/24  Yes Hester Alm BROCKS, MD  ketoconazole  (NIZORAL ) 2 % cream Apply 1 Application topically daily.   Yes [provider]  levothyroxine  (SYNTHROID ) 25 MCG tablet Take 1 tablet (25 mcg total) by mouth daily before breakfast. 02/08/24  Yes Karamalegos, Marsa PARAS, DO  tadalafil  (CIALIS ) 5 MG tablet TAKE 1 TABLET(5 MG) BY MOUTH DAILY 01/25/24  Yes Stoioff, Glendia BROCKS, MD  Testosterone  1.62 % GEL APPLY 1 PUMP TO EACH SHOULDER DAILY 03/14/24  Yes Stoioff, Glendia BROCKS, MD   Allergies  Allergen Reactions   Penicillins     nausea   Amoxicillin Nausea Only    Nausea, anorexia, body aches    FAMILY HISTORY:  family history includes Aneurysm in his paternal grandmother. SOCIAL HISTORY:  reports that he has never smoked. He has never used smokeless tobacco. He reports current alcohol use. He reports that he does not use drugs.   Review of Systems:  Gen:  Denies  fever, sweats, chills weight loss  HEENT: Denies blurred vision, double vision, ear pain, eye pain, hearing loss, nose bleeds, sore throat Cardiac:  No dizziness, chest pain or heaviness, chest tightness,edema, No JVD Resp:   No cough, -sputum production, -shortness of breath,-wheezing, -hemoptysis,  Gi: Denies swallowing difficulty, stomach pain, nausea or vomiting, diarrhea, constipation, bowel incontinence Gu:  Denies bladder incontinence, burning urine Ext:  Denies Joint pain, stiffness or swelling Skin: Denies  skin rash, easy bruising or bleeding or hives Endoc:  Denies polyuria, polydipsia , polyphagia or weight change Psych:   Denies depression, insomnia or hallucinations  Other:  All other systems negative  VITAL SIGNS: BP 100/60   Pulse 74   Temp 98.3 F (36.8 C)   Ht 5' 9 (1.753 m)   Wt 228 lb 12.8 oz (103.8 kg)   SpO2 94%   BMI 33.79 kg/m     Physical Examination:   General Appearance: No distress  EYES PERRLA, EOM  intact.   NECK Supple, No JVD Pulmonary: normal breath sounds, No wheezing.  CardiovascularNormal S1,S2.  No m/r/g.   Abdomen: Benign, Soft, non-tender. Skin:   warm, no rashes, no ecchymosis  Extremities: normal, no cyanosis, clubbing. Neuro:without focal findings,  speech normal  PSYCHIATRIC: Mood, affect within normal limits.   ASSESSMENT AND PLAN  OSA Patient is using and benefiting from CPAP therapy. Discussed the consequences of untreated sleep apnea. Advised not to drive drowsy for safety of patient and others. Will follow up in 1 week.    Hypothyroidism Stable, on current management. Following with PCP.    Patient  satisfied with Plan of action and management. All questions answered  I spent a total of 29 minutes reviewing chart data, face-to-face evaluation with the patient, counseling and coordination of care as detailed above.    Ercil Cassis, M.D.  Sleep Medicine [facility]

## 2024-07-11 ENCOUNTER — Other Ambulatory Visit

## 2024-07-11 DIAGNOSIS — E039 Hypothyroidism, unspecified: Secondary | ICD-10-CM | POA: Diagnosis not present

## 2024-07-11 DIAGNOSIS — G4733 Obstructive sleep apnea (adult) (pediatric): Secondary | ICD-10-CM

## 2024-07-11 DIAGNOSIS — E782 Mixed hyperlipidemia: Secondary | ICD-10-CM

## 2024-07-11 DIAGNOSIS — R7309 Other abnormal glucose: Secondary | ICD-10-CM | POA: Diagnosis not present

## 2024-07-11 DIAGNOSIS — N401 Enlarged prostate with lower urinary tract symptoms: Secondary | ICD-10-CM | POA: Diagnosis not present

## 2024-07-11 DIAGNOSIS — R3912 Poor urinary stream: Secondary | ICD-10-CM | POA: Diagnosis not present

## 2024-07-11 LAB — COMPREHENSIVE METABOLIC PANEL WITH GFR
AG Ratio: 2.1 (calc) (ref 1.0–2.5)
ALT: 32 U/L (ref 9–46)
AST: 20 U/L (ref 10–35)
Albumin: 4.6 g/dL (ref 3.6–5.1)
Alkaline phosphatase (APISO): 68 U/L (ref 35–144)
BUN: 14 mg/dL (ref 7–25)
CO2: 30 mmol/L (ref 20–32)
Calcium: 9.4 mg/dL (ref 8.6–10.3)
Chloride: 106 mmol/L (ref 98–110)
Creat: 1.24 mg/dL (ref 0.70–1.28)
Globulin: 2.2 g/dL (ref 1.9–3.7)
Glucose, Bld: 101 mg/dL — ABNORMAL HIGH (ref 65–99)
Potassium: 4.8 mmol/L (ref 3.5–5.3)
Sodium: 142 mmol/L (ref 135–146)
Total Bilirubin: 0.6 mg/dL (ref 0.2–1.2)
Total Protein: 6.8 g/dL (ref 6.1–8.1)
eGFR: 62 mL/min/1.73m2 (ref >=60)

## 2024-07-11 LAB — CBC WITH DIFFERENTIAL/PLATELET
Absolute Lymphocytes: 1467 {cells}/uL (ref 850–3900)
Absolute Monocytes: 605 {cells}/uL (ref 200–950)
Basophils Absolute: 39 {cells}/uL (ref 0–200)
Basophils Relative: 0.7 %
Eosinophils Absolute: 190 {cells}/uL (ref 15–500)
Eosinophils Relative: 3.4 %
HCT: 49.9 % (ref 39.4–51.1)
Hemoglobin: 16.3 g/dL (ref 13.2–17.1)
MCH: 30.2 pg (ref 27.0–33.0)
MCHC: 32.7 g/dL (ref 31.6–35.4)
MCV: 92.6 fL (ref 81.4–101.7)
MPV: 10.6 fL (ref 7.5–12.5)
Monocytes Relative: 10.8 %
Neutro Abs: 3298 {cells}/uL (ref 1500–7800)
Neutrophils Relative %: 58.9 %
Platelets: 222 Thousand/uL (ref 140–400)
RBC: 5.39 Million/uL (ref 4.20–5.80)
RDW: 13.2 % (ref 11.0–15.0)
Total Lymphocyte: 26.2 %
WBC: 5.6 Thousand/uL (ref 3.8–10.8)

## 2024-07-11 LAB — HEMOGLOBIN A1C
Hgb A1c MFr Bld: 5.9 % — ABNORMAL HIGH (ref <5.7)
Mean Plasma Glucose: 123 mg/dL
eAG (mmol/L): 6.8 mmol/L

## 2024-07-11 LAB — LIPID PANEL
Cholesterol: 159 mg/dL (ref <200)
HDL: 33 mg/dL — ABNORMAL LOW (ref >=40)
LDL Cholesterol (Calc): 98 mg/dL
Non-HDL Cholesterol (Calc): 126 mg/dL (ref <130)
Total CHOL/HDL Ratio: 4.8 (calc) (ref <5.0)
Triglycerides: 185 mg/dL — ABNORMAL HIGH (ref <150)

## 2024-07-11 LAB — PSA: PSA: 0.78 ng/mL (ref <=4.00)

## 2024-07-11 LAB — T4, FREE: Free T4: 0.9 ng/dL (ref 0.8–1.8)

## 2024-07-18 ENCOUNTER — Ambulatory Visit: Admitting: Family Medicine

## 2024-07-18 ENCOUNTER — Encounter: Payer: Self-pay | Admitting: Family Medicine

## 2024-07-18 VITALS — BP 130/80 | HR 69 | Ht 69.0 in | Wt 229.5 lb

## 2024-07-18 DIAGNOSIS — R7309 Other abnormal glucose: Secondary | ICD-10-CM

## 2024-07-18 DIAGNOSIS — E785 Hyperlipidemia, unspecified: Secondary | ICD-10-CM

## 2024-07-18 DIAGNOSIS — E039 Hypothyroidism, unspecified: Secondary | ICD-10-CM

## 2024-07-18 DIAGNOSIS — R3912 Poor urinary stream: Secondary | ICD-10-CM | POA: Diagnosis not present

## 2024-07-18 DIAGNOSIS — N401 Enlarged prostate with lower urinary tract symptoms: Secondary | ICD-10-CM

## 2024-07-18 DIAGNOSIS — G4733 Obstructive sleep apnea (adult) (pediatric): Secondary | ICD-10-CM | POA: Diagnosis not present

## 2024-07-18 NOTE — Progress Notes (Signed)
 Subjective:    Patient ID: Cody Pearson, male    DOB: [birth year], 73 y.o.   MRN: [id number]  Cody Pearson is a 73 y.o. male presenting on 07/18/2024 for Annual Exam   HPI  Discussed the use of AI scribe software for clinical note transcription with the patient, who gave verbal consent to proceed.  History of Present Illness   Cody Pearson is a 73 year old male who presents for a routine winter physical exam.    Weight management - Weight fluctuates between 220 to 229 pounds. - Goal to reduce weight to below 200 pounds. - Mindful of diet, consuming light lunches and dinners Walking daily Reduced portions    Hypothyroidism - Recent free T4 0.9, indicating stable thyroid  function Controlled on Levothyroxine  25mcg daily - No new symptoms related to thyroid  dysfunction   OSA on CPAP - Patient reports prior history of dx OSA and on CPAP, prior to treatment initial symptoms were snoring, daytime sleepiness and fatigue, has had several sleep studies in the past. Most recent followed by [facility] - with sleep management  - Today reports that sleep apnea is well controlled. He uses the CPAP machine every night. Tolerates the machine well, and thinks that sleeps better with it and feels good. No new concerns or symptoms.   BPH ED Hypogonadism Followed by [facility] Continues to take Testosterone  cream topical On Tadalafil  5mg  daily for both BPH and for ED   S/p Partial Knee Replacement R Knee   Elevated A1c Lab result 5.9, improved prior range 5.8-6.0 - Monitors diet by reducing sweets and starches   HYPERLIPIDEMIA: - Reports no concerns. Last lipid panel 07/2024, on Statin, LDL up to 98 from 85 He does have elevated TG 154. - Currently taking Atorvastatin  20mg , tolerating well without side effects or myalgias   Health Maintenance:  Last Colonoscopy done 03/2021, next due 03/2028  Declines Prevnar-20 today, last dose Pneumovax 23 2021, he will wait  for 2026, 5 years.  Shingles vaccine at the pharmacy if interested  PSA 0.78 negative     07/18/2024    2:55 PM 05/06/2024   10:17 AM 02/08/2024   11:29 AM  Depression screen PHQ 2/9  Decreased Interest 0 0 0  Down, Depressed, Hopeless 0 0 0  PHQ - 2 Score 0 0 0  Altered sleeping  0 0  Tired, decreased energy  0 0  Change in appetite  0 0  Feeling bad or failure about yourself   0 0  Trouble concentrating  0 0  Moving slowly or fidgety/restless  0 0  Suicidal thoughts  0 0  PHQ-9 Score  0  0   Difficult doing work/chores  Not difficult at all Not difficult at all     Data saved with a previous flowsheet row definition       07/18/2024    2:55 PM 02/08/2024   11:29 AM 07/28/2023    2:13 PM 04/08/2023    9:40 AM  GAD 7 : Generalized Anxiety Score  Nervous, Anxious, on Edge 0 1 0 0  Control/stop worrying 0 0 0 0  Worry too much - different things 0 1 0 0  Trouble relaxing 0 0 0 0  Restless 0 0 0 0  Easily annoyed or irritable 0 0 0 0  Afraid - awful might happen 0 0 0 0  Total GAD 7 Score 0 2 0 0  Anxiety Difficulty  Not difficult at all  Past Medical History:  Diagnosis Date   Actinic keratosis 03/28/2020   Arthritis    Cancer (HCC)    melanoma   History of colonic polyps    Hyperlipidemia    Melanoma (HCC) ~2017   scalp, txted at [facility]   Sleep apnea    hasn't started using CPAP yet   Past Surgical History:  Procedure Laterality Date   COLONOSCOPY     KNEE ARTHROSCOPY Right    MEDIAL PARTIAL KNEE REPLACEMENT Right    PARTIAL KNEE ARTHROPLASTY Right 10/13/2017   Procedure: UNICOMPARTMENTAL KNEE;  Surgeon: Edie Norleen PARAS, MD;  Location: [facility] ORS;  Service: Orthopedics;  Laterality: Right;   Social History   Socioeconomic History   Marital status: Married    Spouse name: Sena Clouatre   Number of children: 3   Years of education: Not on file   Highest education level: Not on file  Occupational History   Occupation: Maintenance    Employer: [employer]     Comment: Retired 02/2021   Occupation: Retired    Comment: 03/08/2021  Tobacco Use   Smoking status: Never   Smokeless tobacco: Never  Vaping Use   Vaping status: Never Used  Substance and Sexual Activity   Alcohol use: Yes    Comment: wine occ   Drug use: No   Sexual activity: Not on file  Other Topics Concern   Not on file  Social History Narrative   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   Highest level of education 12th grade.   Married.   Lives locally.   Social Drivers of Corporate Investment Banker Strain: Low Risk  (05/06/2024)   Overall Financial Resource Strain (CARDIA)    Difficulty of Paying Living Expenses: Not hard at all  Food Insecurity: No Food Insecurity (05/06/2024)   Hunger Vital Sign    Worried About Running Out of Food in the Last Year: Never true    Ran Out of Food in the Last Year: Never true  Transportation Needs: No Transportation Needs (05/06/2024)   PRAPARE - Administrator, Civil Service (Medical): No    Lack of Transportation (Non-Medical): No  Physical Activity: Sufficiently Active (05/06/2024)   Exercise Vital Sign    Days of Exercise per Week: 7 days    Minutes of Exercise per Session: 30 min  Stress: No Stress Concern Present (05/06/2024)   [employer] of Occupational Health - Occupational Stress Questionnaire    Feeling of Stress: Not at all  Social Connections: Moderately Isolated (05/06/2024)   Social Connection and Isolation Panel    Frequency of Communication with Friends and Family: Twice a week    Frequency of Social Gatherings with Friends and Family: Once a week    Attends Religious Services: Never    Database Administrator or Organizations: No    Attends Banker Meetings: Never    Marital Status: Married  Catering Manager Violence: Not At Risk (05/06/2024)   Humiliation, Afraid, Rape, and Kick questionnaire    Fear of Current or Ex-Partner: No    Emotionally Abused: No     Physically Abused: No    Sexually Abused: No   Family History  Problem Relation Age of Onset   Aneurysm Paternal Grandmother    Colon cancer Neg Hx    Esophageal cancer Neg Hx    Rectal cancer Neg Hx    Stomach cancer Neg Hx    Current Outpatient Medications on  File Prior to Visit  Medication Sig   acetaminophen  (TYLENOL ) 650 MG CR tablet Take 650 mg by mouth every 8 (eight) hours as needed for pain.   AMBULATORY NON FORMULARY MEDICATION Testsosterone 20% (200mg /ml Cream)  Apply 1 ml to upper arms or thights once daily.   atorvastatin  (LIPITOR) 20 MG tablet Take 1 tablet (20 mg total) by mouth daily. For cholesterol.   fluorouracil  (EFUDEX ) 5 % cream Apply topically 2 (two) times daily. Apply to the top of the scalp, forehead, and temples BID x 7 days   ketoconazole  (NIZORAL ) 2 % cream Apply 1 Application topically daily.   levothyroxine  (SYNTHROID ) 25 MCG tablet Take 1 tablet (25 mcg total) by mouth daily before breakfast.   tadalafil  (CIALIS ) 5 MG tablet TAKE 1 TABLET(5 MG) BY MOUTH DAILY   Testosterone  1.62 % GEL APPLY 1 PUMP TO EACH SHOULDER DAILY   No current facility-administered medications on file prior to visit.    Review of Systems  Constitutional:  Negative for activity change, appetite change, chills, diaphoresis, fatigue and fever.  HENT:  Negative for congestion and hearing loss.   Eyes:  Negative for visual disturbance.  Respiratory:  Negative for cough, chest tightness, shortness of breath and wheezing.   Cardiovascular:  Negative for chest pain, palpitations and leg swelling.  Gastrointestinal:  Negative for abdominal pain, constipation, diarrhea, nausea and vomiting.  Genitourinary:  Negative for dysuria, frequency and hematuria.  Musculoskeletal:  Negative for arthralgias and neck pain.  Skin:  Negative for rash.  Neurological:  Negative for dizziness, weakness, light-headedness, numbness and headaches.  Hematological:  Negative for adenopathy.   Psychiatric/Behavioral:  Negative for behavioral problems, dysphoric mood and sleep disturbance.    Per HPI unless specifically indicated above     Objective:    BP 130/80 (BP Location: Right Arm, Patient Position: Sitting, Cuff Size: Normal)   Pulse 69   Ht 5' 9 (1.753 m)   Wt 229 lb 8 oz (104.1 kg)   SpO2 95%   BMI 33.89 kg/m   Wt Readings from Last 3 Encounters:  07/18/24 229 lb 8 oz (104.1 kg)  06/14/24 228 lb 12.8 oz (103.8 kg)  06/03/24 220 lb (99.8 kg)    Physical Exam Vitals and nursing note reviewed.  Constitutional:      General: He is not in acute distress.    Appearance: He is well-developed. He is not diaphoretic.     Comments: Well-appearing, comfortable, cooperative  HENT:     Head: Normocephalic and atraumatic.  Eyes:     General:        Right eye: No discharge.        Left eye: No discharge.     Conjunctiva/sclera: Conjunctivae normal.     Pupils: Pupils are equal, round, and reactive to light.  Neck:     Thyroid : No thyromegaly.     Vascular: No carotid bruit.  Cardiovascular:     Rate and Rhythm: Normal rate and regular rhythm.     Pulses: Normal pulses.     Heart sounds: Normal heart sounds. No murmur heard. Pulmonary:     Effort: Pulmonary effort is normal. No respiratory distress.     Breath sounds: Normal breath sounds. No wheezing or rales.  Abdominal:     General: Bowel sounds are normal. There is no distension.     Palpations: Abdomen is soft. There is no mass.     Tenderness: There is no abdominal tenderness.  Musculoskeletal:  General: No tenderness. Normal range of motion.     Cervical back: Normal range of motion and neck supple.     Right lower leg: No edema.     Left lower leg: No edema.     Comments: Upper / Lower Extremities: - Normal muscle tone, strength bilateral upper extremities 5/5, lower extremities 5/5  Lymphadenopathy:     Cervical: No cervical adenopathy.  Skin:    General: Skin is warm and dry.      Findings: No erythema or rash.  Neurological:     Mental Status: He is alert and oriented to person, place, and time.     Comments: Distal sensation intact to light touch all extremities  Psychiatric:        Mood and Affect: Mood normal.        Behavior: Behavior normal.        Thought Content: Thought content normal.     Comments: Well groomed, good eye contact, normal speech and thoughts     Results for orders placed or performed in visit on 07/11/24  T4, free   Collection Time: 07/11/24  8:42 AM  Result Value Ref Range   Free T4 0.9 0.8 - 1.8 ng/dL  Comprehensive metabolic panel with GFR   Collection Time: 07/11/24  8:42 AM  Result Value Ref Range   Glucose, Bld 101 (H) 65 - 99 mg/dL   BUN 14 7 - 25 mg/dL   Creat 8.75 9.29 - 8.71 mg/dL   eGFR 62 > OR = 60 fO/fpw/8.26f7   BUN/Creatinine Ratio SEE NOTE: 6 - 22 (calc)   Sodium 142 135 - 146 mmol/L   Potassium 4.8 3.5 - 5.3 mmol/L   Chloride 106 98 - 110 mmol/L   CO2 30 20 - 32 mmol/L   Calcium  9.4 8.6 - 10.3 mg/dL   Total Protein 6.8 6.1 - 8.1 g/dL   Albumin 4.6 3.6 - 5.1 g/dL   Globulin 2.2 1.9 - 3.7 g/dL (calc)   AG Ratio 2.1 1.0 - 2.5 (calc)   Total Bilirubin 0.6 0.2 - 1.2 mg/dL   Alkaline phosphatase (APISO) 68 35 - 144 U/L   AST 20 10 - 35 U/L   ALT 32 9 - 46 U/L  PSA   Collection Time: 07/11/24  8:42 AM  Result Value Ref Range   PSA 0.78 < OR = 4.00 ng/mL  CBC with Differential/Platelet   Collection Time: 07/11/24  8:42 AM  Result Value Ref Range   WBC 5.6 3.8 - 10.8 Thousand/uL   RBC 5.39 4.20 - 5.80 Million/uL   Hemoglobin 16.3 13.2 - 17.1 g/dL   HCT 50.0 60.5 - 48.8 %   MCV 92.6 81.4 - 101.7 fL   MCH 30.2 27.0 - 33.0 pg   MCHC 32.7 31.6 - 35.4 g/dL   RDW 86.7 88.9 - 84.9 %   Platelets 222 140 - 400 Thousand/uL   MPV 10.6 7.5 - 12.5 fL   Neutro Abs 3,298 1,500 - 7,800 cells/uL   Absolute Lymphocytes 1,467 850 - 3,900 cells/uL   Absolute Monocytes 605 200 - 950 cells/uL   Eosinophils Absolute 190 15 -  500 cells/uL   Basophils Absolute 39 0 - 200 cells/uL   Neutrophils Relative % 58.9 %   Total Lymphocyte 26.2 %   Monocytes Relative 10.8 %   Eosinophils Relative 3.4 %   Basophils Relative 0.7 %  Hemoglobin A1c   Collection Time: 07/11/24  8:42 AM  Result Value Ref Range  Hgb A1c MFr Bld 5.9 (H) <5.7 %   Mean Plasma Glucose 123 mg/dL   eAG (mmol/L) 6.8 mmol/L  Lipid panel   Collection Time: 07/11/24  8:42 AM  Result Value Ref Range   Cholesterol 159 <200 mg/dL   HDL 33 (L) > OR = 40 mg/dL   Triglycerides 814 (H) <150 mg/dL   LDL Cholesterol (Calc) 98 mg/dL (calc)   Total CHOL/HDL Ratio 4.8 <5.0 (calc)   Non-HDL Cholesterol (Calc) 126 <130 mg/dL (calc)      Assessment & Plan:   Problem List Items Addressed This Visit     Benign prostatic hyperplasia with weak urinary stream   Elevated hemoglobin A1c   Hyperlipidemia - Primary   Hypothyroidism (acquired)   OSA on CPAP     Updated Health Maintenance information Reviewed recent lab results with patient Encouraged improvement to lifestyle with diet and exercise Goal of weight loss   Adult Wellness Visit Routine visit with stable weight and normal thyroid  function. No signs of anemia, prostate cancer, or liver/kidney dysfunction. - Continue current medications and lifestyle modifications. - Scheduled next wellness visit in six months.  Hyperlipidemia LDL cholesterol increased to 98 mg/dL. Current atorvastatin  management maintained due to stable levels and recent dietary indulgences. - Continue atorvastatin  20 mg daily. - Provided information on heart-healthy, low cholesterol diet via MyChart. - Monitor cholesterol levels and consider medication adjustment if levels continue to rise. - Recommend Coronary CT Calcium  score imaging, he declines and request next time in 2026  OSA on CPAP Well controlled, chronic OSA on CPAP - Good adherence to CPAP nightly - Continue current CPAP therapy, patient seems to be benefiting  from therapy   Prediabetes A1c improved to 5.9%, indicating better glycemic control. - Continue current dietary modifications to maintain glycemic control.  Hypothyroidism Thyroid  function tests normal. Current medication effective. - Continue current thyroid  medication regimen.         No orders of the defined types were placed in this encounter.   No orders of the defined types were placed in this encounter.    Follow up plan: Return for 6 month PreDM A1c.  Marsa Officer, DO [facility] 07/18/2024, 3:07 PM

## 2024-07-18 NOTE — Patient Instructions (Addendum)
 Thank you for coming to the office today.  Consider dose increase Statin therapy next year if still higher cholesterol LDL >100 Slight increase on LDL Continue Atorvastatin  20mg  daily for now  Next year Prevnar-20 pneumonia vaccine  Next Colonoscopy 2029  Future Shingles vaccine at the pharmacy if interested $0  Recent Labs    07/20/23 0841 02/04/24 0843 07/11/24 0842  HGBA1C 6.0* 6.0* 5.9*    Please schedule a Follow-up Appointment to: Return for 6 month PreDM A1c.  If you have any other questions or concerns, please feel free to call the office or send a message through MyChart. You may also schedule an earlier appointment if necessary.  Additionally, you may be receiving a survey about your experience at our office within a few days to 1 week by e-mail or mail. We value your feedback.  Marsa Officer, DO [facility], [place]

## 2024-12-02 ENCOUNTER — Other Ambulatory Visit

## 2025-01-17 ENCOUNTER — Ambulatory Visit: Admitting: Family Medicine

## 2025-02-09 ENCOUNTER — Ambulatory Visit: Admitting: Dermatology

## 2025-05-19 ENCOUNTER — Ambulatory Visit

## 2025-05-31 ENCOUNTER — Other Ambulatory Visit

## 2025-06-02 ENCOUNTER — Ambulatory Visit: Admitting: Urology
